# Patient Record
Sex: Male | Born: 1965 | Race: White | Hispanic: Yes | Marital: Single | Smoking: Former smoker
Health system: Southern US, Community
[De-identification: ages and names within clinical notes are randomized; demographics above are authoritative.]

## PROBLEM LIST (undated history)

## (undated) DIAGNOSIS — G4730 Sleep apnea, unspecified: Secondary | ICD-10-CM

## (undated) DIAGNOSIS — E119 Type 2 diabetes mellitus without complications: Secondary | ICD-10-CM

## (undated) DIAGNOSIS — R569 Unspecified convulsions: Secondary | ICD-10-CM

## (undated) DIAGNOSIS — K219 Gastro-esophageal reflux disease without esophagitis: Secondary | ICD-10-CM

## (undated) DIAGNOSIS — R0602 Shortness of breath: Secondary | ICD-10-CM

## (undated) DIAGNOSIS — K7460 Unspecified cirrhosis of liver: Secondary | ICD-10-CM

## (undated) DIAGNOSIS — F1010 Alcohol abuse, uncomplicated: Secondary | ICD-10-CM

## (undated) DIAGNOSIS — R51 Headache: Secondary | ICD-10-CM

## (undated) DIAGNOSIS — Z8719 Personal history of other diseases of the digestive system: Secondary | ICD-10-CM

## (undated) HISTORY — PX: TIPS PROCEDURE: SHX808

## (undated) HISTORY — PX: APPENDECTOMY: SHX54

## (undated) HISTORY — PX: ESOPHAGOGASTRODUODENOSCOPY: SHX1529

---

## 2004-01-03 ENCOUNTER — Emergency Department (HOSPITAL_COMMUNITY): Admission: EM | Admit: 2004-01-03 | Discharge: 2004-01-03 | Payer: Self-pay | Admitting: Emergency Medicine

## 2004-09-22 ENCOUNTER — Inpatient Hospital Stay (HOSPITAL_COMMUNITY): Admission: EM | Admit: 2004-09-22 | Discharge: 2004-09-23 | Payer: Self-pay | Admitting: Emergency Medicine

## 2004-12-24 ENCOUNTER — Emergency Department (HOSPITAL_COMMUNITY): Admission: EM | Admit: 2004-12-24 | Discharge: 2004-12-24 | Payer: Self-pay | Admitting: Emergency Medicine

## 2005-01-13 ENCOUNTER — Ambulatory Visit: Payer: Self-pay | Admitting: Internal Medicine

## 2005-11-11 ENCOUNTER — Ambulatory Visit: Payer: Self-pay | Admitting: Internal Medicine

## 2005-11-12 ENCOUNTER — Ambulatory Visit: Payer: Self-pay | Admitting: *Deleted

## 2006-02-21 ENCOUNTER — Inpatient Hospital Stay (HOSPITAL_COMMUNITY): Admission: EM | Admit: 2006-02-21 | Discharge: 2006-03-02 | Payer: Self-pay | Admitting: Emergency Medicine

## 2006-02-21 ENCOUNTER — Ambulatory Visit: Payer: Self-pay | Admitting: Family Medicine

## 2006-02-22 ENCOUNTER — Encounter (INDEPENDENT_AMBULATORY_CARE_PROVIDER_SITE_OTHER): Payer: Self-pay | Admitting: *Deleted

## 2006-03-02 ENCOUNTER — Ambulatory Visit: Payer: Self-pay | Admitting: Gastroenterology

## 2006-11-23 ENCOUNTER — Encounter (INDEPENDENT_AMBULATORY_CARE_PROVIDER_SITE_OTHER): Payer: Self-pay | Admitting: *Deleted

## 2009-02-18 ENCOUNTER — Emergency Department (HOSPITAL_COMMUNITY): Admission: EM | Admit: 2009-02-18 | Discharge: 2009-02-19 | Payer: Self-pay | Admitting: Emergency Medicine

## 2010-06-09 LAB — DIFFERENTIAL
Basophils Absolute: 0 10*3/uL (ref 0.0–0.1)
Basophils Relative: 1 % (ref 0–1)
Eosinophils Absolute: 0 10*3/uL (ref 0.0–0.7)
Eosinophils Relative: 0 % (ref 0–5)
Lymphocytes Relative: 16 % (ref 12–46)
Lymphs Abs: 0.8 10*3/uL (ref 0.7–4.0)
Monocytes Absolute: 0.4 10*3/uL (ref 0.1–1.0)
Monocytes Relative: 7 % (ref 3–12)
Neutro Abs: 3.7 10*3/uL (ref 1.7–7.7)
Neutrophils Relative %: 76 % (ref 43–77)

## 2010-06-09 LAB — COMPREHENSIVE METABOLIC PANEL
ALT: 65 U/L — ABNORMAL HIGH (ref 0–53)
AST: 70 U/L — ABNORMAL HIGH (ref 0–37)
Albumin: 4.2 g/dL (ref 3.5–5.2)
Alkaline Phosphatase: 61 U/L (ref 39–117)
BUN: 5 mg/dL — ABNORMAL LOW (ref 6–23)
CO2: 25 mEq/L (ref 19–32)
Calcium: 9 mg/dL (ref 8.4–10.5)
Chloride: 99 mEq/L (ref 96–112)
Creatinine, Ser: 0.61 mg/dL (ref 0.4–1.5)
GFR calc Af Amer: >60 mL/min (ref >60)
GFR calc non Af Amer: >60 mL/min (ref >60)
Glucose, Bld: 144 mg/dL — ABNORMAL HIGH (ref 70–99)
Potassium: 2.8 mEq/L — ABNORMAL LOW (ref 3.5–5.1)
Sodium: 135 mEq/L (ref 135–145)
Total Bilirubin: 1.5 mg/dL — ABNORMAL HIGH (ref 0.3–1.2)
Total Protein: 7.8 g/dL (ref 6.0–8.3)

## 2010-06-09 LAB — URINALYSIS, ROUTINE W REFLEX MICROSCOPIC
Glucose, UA: 100 mg/dL — AB
Hgb urine dipstick: NEGATIVE
Ketones, ur: 15 mg/dL — AB
Leukocytes, UA: NEGATIVE
Nitrite: NEGATIVE
Protein, ur: 100 mg/dL — AB
Specific Gravity, Urine: 1.028 (ref 1.005–1.030)
Urobilinogen, UA: 1 mg/dL (ref 0.0–1.0)
pH: 8.5 — ABNORMAL HIGH (ref 5.0–8.0)

## 2010-06-09 LAB — CBC
HCT: 44.8 % (ref 39.0–52.0)
Hemoglobin: 15.6 g/dL (ref 13.0–17.0)
MCHC: 34.9 g/dL (ref 30.0–36.0)
MCV: 93.2 fL (ref 78.0–100.0)
Platelets: 73 10*3/uL — ABNORMAL LOW (ref 150–400)
RBC: 4.81 MIL/uL (ref 4.22–5.81)
RDW: 13.7 % (ref 11.5–15.5)
WBC: 4.9 10*3/uL (ref 4.0–10.5)

## 2010-06-09 LAB — URINE MICROSCOPIC-ADD ON

## 2010-06-09 LAB — LIPASE, BLOOD: Lipase: 27 U/L (ref 11–59)

## 2010-07-24 NOTE — Discharge Summary (Signed)
NAME:  [name] [name], [name] [name] NO.:  [id number]   MEDICAL RECORD NO.:  [id number]          PATIENT TYPE:  INP   LOCATION:  4704                         FACILITY:  [facility]   PHYSICIAN:  Pearlean Brownie, M.D.DATE OF BIRTH:  Nov 29, 1965   DATE OF ADMISSION:  02/21/2006  DATE OF DISCHARGE:  03/02/2006                               DISCHARGE SUMMARY   PRIMARY CARE PHYSICIAN:  [facility].   DISCHARGE DIAGNOSES:  1. Upper gastrointestinal bleed.  EGD performed on February 22, 2006      showed severe ulcerative esophagitis likely reflux related.  2. Alcohol abuse.  3. Delirium tremens.  4. Thrush.  5. H.pylori positive.   PROCEDURES:  1. An EGD performed February 22, 2006, shows severe ulcerative      esophagitis, likely reflux related.  There was no esophageal or      gastric varices.  They did have mild portohypertensive gastropathy      changes.  There was a single small, clean based duodenal ulcer in      the bulb.  Cultures were sent and was found to be positive.  2. Included a chest x-ray, December 17, that showed no acute findings.  3. An abdominal ultrasound, December 18, which showed no ascites or      pronounced splenomegaly.  Showed normal hepatopetal flow in the      portal vein.  The liver is enlarged and echogenic consistent with a      hepatocellular disease.  It did not show shrinkage or __________      that would allow a diagnosis of cirrhosis at the time of the      ultrasound.   LABS:  Urine culture, December 17, was insignificant growth.  Patient  had a CLOtest performed on the 18th of December and was found to be  urease positive, detesting the presence of Helicobacter pylori.  Hemoglobin A1c, obtained in the hospital, was 6.3.  His admission  hemoglobin was 15.4, discharge hemoglobin was stable at 14.5.  He had a  CMP obtained, on admission, which showed an AST of 222, an ALT of 118,  alkaline phosphatase of 95, T.bili of 1.9 with  normal creatinine, mildly  elevated glucose.  Lipase, obtained on the 17th of December, was 25 and  normal.  The elevated total bilirubin was then fractionated, he had a  total bilirubin of 1.7 with a direct bilirubin of 0.7 and an indirect  bilirubin of 1.0, it was felt that this was likely related to an acute  hepatic insult.  By the 23rd of December, his total bilirubin was 2.4,  alkaline phosphatase was 93, AST is 143, ALT is 102, again thought  consistent with alcoholic hepatitis.   HISTORY AND PHYSICAL:  Please see a copy of the dictated on the chart,  but in short, the patient is  45 year old Hispanic male with a history  of drinking 1 liter of Tequila per day and with a history of withdrawals  DTs earlier this year who came in with bloody emesis.  He was admitted  for an upper GI bleed. GI was consulted.  Dr. Christella Hartigan performed an EGD on  the 18th of December, shortly after admission.   HOSPITAL COURSE:  1. Blood emesis.  An EGD was performed by Dr. Christella Hartigan, which showed      severe ulcerative esophagitis, likely reflux related.  The patient      was watched in the hospital twice daily PPI.  His hemoglobin was      stable and the bloody emesis resolved and had not occurred for the      4 days prior to discharge.  He was tolerating p.o. without      difficulty and was denying any melena, nausea, vomiting or      abdominal pain and again had a stable hemoglobin.  CLOtest were      obtained following the EGD, which showed to be H.pylori positive      and the patient was placed on triple therapy for that for a      duration of 2 weeks and then instructed to continue twice daily      Prilosec for his foreseeable future until he follows up at      [facility].  2. H.pylori positive.  Patient was started on Biaxin 500 mg p.o.      b.i.d., Protonix 40 mg p.o. b.i.d. and amoxicillin 500 mg p.o.      b.i.d., this is to continue up through March 09, 2006, which      completes  2 weeks of triple therapy and so instructed to continue      Prilosec OTC 20 mg p.o. b.i.d. until seen by [facility] and      cleared.  He also instructed not to use ibuprofen, aspirin or      alcohol.  3. Alcohol abuse.  Patient went through delirium tremens in the      hospital with tachycardia, hallucinations and hypertensive      episodes.  He was started on an Ativan taper and monitored on      telemetry.  On the days prior to discharge, the patient was stable      on 1 mg p.o. q.8 hours of Ativan with a heart rate in the 70s-100s      with normal blood pressures and no fevers and hallucinations.  He      had no tremor on the day of discharge.  I sat and discussed with      the patient his options, he indicated that he wanted to stop      alcohol consumption.  We will arrange to have social work discuss      with the patient providing the numbers and contact information for      alcohol treatment centers available to him.  We will also have him      complete a prolonged taper of Ativan as an outpatient, he is to      take 1 mg p.o. q.8 hours on the 26th of December and the 27th of      December, then 1 mg p.o. q.12 hours on the 28th of December and the      29th of December and then 1 mg p.o. daily on the 30th of December      and then quit.  4. Hepatitis.  Ultrasound shows diffuse hepatocellar disease.  Given      the fact that he drinks approximately 1 liter of Tequila a day, it      was felt that this was likely alcohol related hepatitis.  Advised  the patient to abstain from alcohol.  Would appreciate a followup      at [facility], they will complete a workup for hepatitis,      including hepatitis serologies, etc, if they feel is indicated.      Would also repeat a CMP at his followup to evaluate and make sure      that his LFTs have trended down after abstinence from alcohol.  He      did have an abdominal ultrasound in the hospital that showed no     evidence of  gallstone disease or as dictated above.   DISCHARGE MEDICATIONS:  1. Biaxin 500 mg p.o. b.i.d. until January 2.  2. Amoxicillin 500 mg p.o. b.i.d. until January 2.  3. Prilosec OTC 20 mg p.o. b.i.d. indefinitely until seen at      [facility].  4. Ativan 1 mg p.o. q.8 hours on the 26th of December and the 27th of      December, then 1 mg p.o. q.12 hours on the 28th of December and the      29th of December and then 1 mg p.o. daily on the 30th of December,      then stop.   DISCHARGE INSTRUCTIONS:  He is instructed to call [facility] and  arrange a followup appointment in 2 weeks.  He is also instructed to  abstain from alcohol, Tequila, aspirin and ibuprofen.      Broadus John T. Pamalee Leyden, MD    ______________________________  Pearlean Brownie, M.D.    WTP/MEDQ  D:  03/02/2006  T:  03/03/2006  Job:  [id number]   cc:   [facility]

## 2010-07-24 NOTE — H&P (Signed)
NAME:  [name] [name], [name] [name] NO.:  [id number]   MEDICAL RECORD NO.:  [id number]          PATIENT TYPE:  EMS   LOCATION:  MAJO                         FACILITY:  [facility]   PHYSICIAN:  Lonia Blood, M.D.      DATE OF BIRTH:  04/23/1965   DATE OF ADMISSION:  09/21/2004  DATE OF DISCHARGE:                                HISTORY & PHYSICAL   PRIMARY CARE PHYSICIAN:  The patient is unassigned.   PRESENTING COMPLAINT:  Nausea and vomiting.   HISTORY OF PRESENT ILLNESS:  The patient is a 45 year old Hispanic male with  no significant past medical history who apparently drinks at least 3-4  bottles of beer every day, also other types of alcohol. The patient came in  with a 1-day history of intractable nausea and vomiting. The patient has  vomited at least 10 times and is unable to keep anything down. Per patient,  he has not had any diarrhea. There was a question of some blood streaks in  one vomitus at home. He is apparently not had adequate alcohol intake for  the past 2 days. He drink one bottle yesterday but has been wanting to quit.  The patient wants some help at quitting alcohol at this stage.  Denied any  hematochezia or melena. However he has complained of epigastric pain.   PAST MEDICAL HISTORY:  None.   MEDICATIONS:  None.   ALLERGIES:  No known drug allergies.   SOCIAL HISTORY:  The patient is an Hispanic immigrant, lives here with his  wife. No children. Drinks about three bottles of beer every day plus other  hard liquor if available. Also smokes about 2-3 cigarettes per day. Denied  any IV drug use.  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   FAMILY HISTORY:  Denied any family history of hypertension or diabetes.   REVIEW OF SYSTEMS:  As obtained through an Hispanic interpreter, the patient  denied any other symptoms through a 10-point review of systems.   PHYSICAL EXAMINATION:  VITAL SIGNS:  Temperature is 97.6, blood pressure  166/77 supine, 155/87 sitting, and 142/86 standing with a pulse of 100, 106,  and 108 respectively. Respiratory 28. His saturation were 96% on room air.  GENERAL:  The patient is alert and oriented, non English speaking with  history obtained through his interpreter. He looks like he is in pain.  HEENT:  Pupils are equal, round and reactive to light. EOMI.  NECK:  Supple, no jugular venous distension, no lymphadenopathy.  RESPIRATORY:  Good air entry bilaterally, no wheezes or rales.  CARDIOVASCULAR:  Tachycardic.  ABDOMEN:  Soft, nontender with positive bowel sounds although he has some  epigastric discomfort.  EXTREMITIES:  No clubbing, cyanosis or edema.   LABORATORY DATA:  Sodium 142, potassium 3.6, chloride 106, BUN 3, glucose  158. Creatinine 0.6, white count 8100, hemoglobin 12.9, platelet count  72,000 with an MCV of 98.5, differential was normal. Total protein 7.4,  albumin 3.7, AST 164, ALT 63, alkaline phosphatase 74, total bilirubin 2.1  with an indirect bilirubin of 1.4. Alcohol was less  than 5. Urine drug  screen essentially negative.   ASSESSMENT:  1.  This is a 45 year old Hispanic with apparent history of alcoholism,      presenting with what appeared to be alcohol withdrawal syndrome.  2.  The patient also has severe thrombocytopenia.  3.  Elevation of liver function tests. His increase in liver function tests      and thrombocytopenia could all be related to alcoholism.   PLAN:  1.  Alcohol withdrawal. Will admit the patient, control his nausea and      vomiting which seems to be secondary to his alcohol withdrawal. Will use      a combination of both Phenergan and Zofran as indicated. I will keep him      on only clear liquids. While hydrating the patient it looks like he is      slightly orthostatic. Will use D5 normal for now. Once the patient's      situation stabilizes, will consider probably Behavior Health if he will      qualify. In the meantime will also  place him on a proton pump inhibitor.      There is questionable hematemesis, will Guaiac his stools while he is      here and if his stools are positive will get GI involved.  2.  Increased liver function tests. The patient has a 2/12 split which may      indicate this is secondary to alcohol, including his thrombocytopenia.      Hopefully quitting the alcohol will help.  3.  Alcoholism. Will put patient on some thiamine and folate, also put him      in the DT ward just in case he goes into DT's. I will given Ativan      p.r.n. for now and if he goes into full blown DT will start DT protocol.  4.  Thrombocytopenia. As indicated, this is most likely secondary to his      alcoholism. I will follow his blood levels closely during this      hospitalization.  5.  Tobacco use. Will continue to counsel patient while in the hospital.      Will put him some nicotine patches if needed.  6.  High blood pressure. The patient's blood pressure on admission seemed to      be high but patient denied any history of hypertension. Will follow him      closely while in the hospital.  If his blood pressure remains elevated we may have to initiate him on some  blood pressure medicine, probably Clonidine.       LG/MEDQ  D:  09/21/2004  T:  09/22/2004  Job:  [id number]

## 2010-07-24 NOTE — H&P (Signed)
NAME:  [name] [name], [name] [name] NO.:  [id number]   MEDICAL RECORD NO.:  [id number]          PATIENT TYPE:  INP   LOCATION:  4737                         FACILITY:  [facility]   PHYSICIAN:  Lupita Raider, M.D.   DATE OF BIRTH:  13-Jul-1965   DATE OF ADMISSION:  02/21/2006  DATE OF DISCHARGE:                              HISTORY & PHYSICAL   PRIMARY CARE PHYSICIAN:  None.   CHIEF COMPLAINT:  Bloody emesis.   HISTORY OF PRESENT ILLNESS:  This is a 45 year old Hispanic male with 1  liter of  Tequila per day alcohol abuse with a history of withdrawal DTs  after three days abstinence earlier this year, now with an one-day  history of abdominal pain in the epigastric area and vomiting blood-  streaked emesis since last night 10 times.  Vomiting started last night  after finishing his typical Tequila dosage.  He complains of dizziness  with standing and abdominal pain in the epigastrium, not relieved with  vomiting, but otherwise without complaints, including no chest pain,  shortness of breath, or diarrhea.  No history of varices or history of a  scope.  No loss of consciousness or syncope.  His last Tequila drink was  last evening.   REVIEW OF SYSTEMS:  Negative for cough, shortness of breath, chest pain,  dysuria, diarrhea.  Is positive, except for the HPI for mild headache,  diffuse fatigue and dysphagia since the vomiting began.   PAST MEDICAL HISTORY:  Negative except for a previous admission with  alcohol withdrawal.   PAST SURGICAL HISTORY:  No past surgical history.   ALLERGIES:  NO KNOWN DRUG ALLERGIES.   MEDICATIONS:  None.   SOCIAL HISTORY:  Lives at home with his girlfriend but does seem to be  married.  There are kids.  He works in [employer].  He drinks two 0.5  liter bottles of Tequila per day.  Stopped smoking three months ago.  Denies illicit drugs.   FAMILY HISTORY:  Mother currently living and healthy at 73, only with  diabetes.  Father was murdered.   No other significant family history.   PHYSICAL EXAMINATION:  VITAL SIGNS:  Temperature 98.8, respirations 20-  30, heart rate 103-118, blood pressure 144 to 148 over 84 to 88,  saturating 96-97% on room air.  GENERAL:  This is a fatigued, but alert and nontoxic-appearing, Hispanic  male who appears his stated age and speaks minimal [place].  HEENT:  Moist mucous membranes.  Normal oropharynx.  Muddy sclerae with  pupils equal, round and reactive to light.  Extraocular motor is intact.  No lymphadenopathy.  Poor dentition.  Conjunctivae are pink.  CARDIOVASCULAR:  Regular rate but tachycardic.  No murmurs, rubs or  gallops.  Brisk capillary refill.  No bruits.  PULMONARY:  Clear to auscultation bilaterally.  No wheezes, rales or  crackles.  ABDOMEN:  Obese, positive bowel sounds, soft, tender to palpation in the  epigastric area but no rebound, guarding or rigidity.  Could not  appreciate a fluid wave.  Liver edge not palpable below the costal  margin secondary to body habitus.  EXTREMITIES:  No edema.  SKIN:  Warm and dry.  Good skin turgor.  NEUROLOGIC:  Cranial nerves II-XII are intact.  Strength 5/5 in all  extremities.  Reflexes 2+ in all extremities.  Alert and oriented x3.  RECTAL:  Normal tone.  No internal or external hemorrhoids appreciated.  Hemoccult positive.   LABORATORY DATA:  White blood cell count 6.8, hemoglobin 15.4,  hematocrit 44.7, platelets 124, 83% neutrophils, sodium 137, potassium  3.2, chloride 96, bicarbonate 27, BUN 3, creatinine 0.6, glucose 174,  AST 222, ALT 118, calcium 9.7, total protein 7.9, albumin 4, total  bilirubin 1.9, lipase 25, UA 1.027 specific gravity with small  bilirubin, 15 ketones, greater than 300 protein, positive nitrites,  small leukocyte esterase, 3-6 white blood cells and rare bacteria.  Enzymes with a myoglobin of 45.7, CK-MB 1.3 and troponins 0.08.  EKG  showed normal sinus rhythm.   ASSESSMENT AND PLAN:  This is a 45 year old  Hispanic male with  significant alcohol history now with bloody emesis and elevated  troponin.  1. Bloody emesis.  Differential diagnosis is gastritis versus Mallory-      Weiss tear versus varices.  Hemodynamically unstable.  We will      obtain access and type and cross 2 units followed by 8 p.m. CBC.      Start IV fluids.  Protonix intravenously b.i.d.  Give him a GI      cocktail and follow clinically.  No need to call GI at this point      as he is stable, but would consider a GI consult at some point to      evaluate for a varices if he continues to have bleeding episodes.      Consider a beta blocker if blood pressure and heart rate can      tolerate.  2. Increased LFTs.  Likely this is secondary to alcohol but do wonder      about possible hepatitis.  We will check an acute hepatitis panel      and follow clinically.  3. Alcohol.  We will treat with Ativan protocol, thiamine and folate      and watch for symptoms of withdrawal.  Patient not ready to stop      drinking at this point, but we will continue to encourage him to do      so.  4. Elevated troponin.  Differential diagnosis can be from strain or      frank myocardial infarction.  We will admit and rule out for MI.      EKG showed normal sinus rhythm.  We will check a chest x-ray and      place him on the telemetry floor until rule out.  5. FEN.  GI and n.p.o. for now, but antibiotics x1 and then D5 half      normal saline with 20 of KCl at 125 mL/hour.  Place K for now      intravenously and follow his electrolytes.  6. Prophylaxis.  Sequential compression devices and proton pump      inhibitor.  7. Full code.           ______________________________  Lupita Raider, M.D.     KS/MEDQ  D:  02/21/2006  T:  02/22/2006  Job:  [id number]

## 2012-01-29 ENCOUNTER — Inpatient Hospital Stay (HOSPITAL_COMMUNITY): Payer: Self-pay

## 2012-01-29 ENCOUNTER — Inpatient Hospital Stay (HOSPITAL_COMMUNITY)
Admission: EM | Admit: 2012-01-29 | Discharge: 2012-02-01 | DRG: 405 | Disposition: A | Discharge status: Home / Self Care | Payer: MEDICAID | Source: Other Acute Inpatient Hospital | Attending: Internal Medicine | Admitting: Internal Medicine

## 2012-01-29 DIAGNOSIS — D6959 Other secondary thrombocytopenia: Secondary | ICD-10-CM | POA: Diagnosis present

## 2012-01-29 DIAGNOSIS — I8511 Secondary esophageal varices with bleeding: Secondary | ICD-10-CM | POA: Diagnosis present

## 2012-01-29 DIAGNOSIS — Z8719 Personal history of other diseases of the digestive system: Secondary | ICD-10-CM

## 2012-01-29 DIAGNOSIS — E876 Hypokalemia: Secondary | ICD-10-CM | POA: Diagnosis not present

## 2012-01-29 DIAGNOSIS — E669 Obesity, unspecified: Secondary | ICD-10-CM | POA: Diagnosis present

## 2012-01-29 DIAGNOSIS — F1010 Alcohol abuse, uncomplicated: Secondary | ICD-10-CM | POA: Diagnosis present

## 2012-01-29 DIAGNOSIS — Z6831 Body mass index (BMI) 31.0-31.9, adult: Secondary | ICD-10-CM

## 2012-01-29 DIAGNOSIS — D696 Thrombocytopenia, unspecified: Secondary | ICD-10-CM

## 2012-01-29 DIAGNOSIS — I8510 Secondary esophageal varices without bleeding: Secondary | ICD-10-CM | POA: Diagnosis present

## 2012-01-29 DIAGNOSIS — K7030 Alcoholic cirrhosis of liver without ascites: Principal | ICD-10-CM | POA: Diagnosis present

## 2012-01-29 DIAGNOSIS — F1020 Alcohol dependence, uncomplicated: Secondary | ICD-10-CM | POA: Diagnosis present

## 2012-01-29 DIAGNOSIS — E119 Type 2 diabetes mellitus without complications: Secondary | ICD-10-CM | POA: Diagnosis present

## 2012-01-29 HISTORY — DX: Unspecified convulsions: R56.9

## 2012-01-29 HISTORY — DX: Shortness of breath: R06.02

## 2012-01-29 HISTORY — DX: Personal history of other diseases of the digestive system: Z87.19

## 2012-01-29 HISTORY — DX: Headache: R51

## 2012-01-29 HISTORY — DX: Gastro-esophageal reflux disease without esophagitis: K21.9

## 2012-01-29 HISTORY — DX: Sleep apnea, unspecified: G47.30

## 2012-01-29 HISTORY — DX: Type 2 diabetes mellitus without complications: E11.9

## 2012-01-29 LAB — COMPREHENSIVE METABOLIC PANEL
AST: 27 U/L (ref 0–37)
Albumin: 2.4 g/dL — ABNORMAL LOW (ref 3.5–5.2)
BUN: 19 mg/dL (ref 6–23)
Calcium: 7.6 mg/dL — ABNORMAL LOW (ref 8.4–10.5)
Chloride: 114 mEq/L — ABNORMAL HIGH (ref 96–112)
Creatinine, Ser: 0.59 mg/dL (ref 0.50–1.35)
GFR calc non Af Amer: >90 mL/min (ref >90)
Total Bilirubin: 1 mg/dL (ref 0.3–1.2)

## 2012-01-29 LAB — CBC
Hemoglobin: 9.3 g/dL — ABNORMAL LOW (ref 13.0–17.0)
Platelets: 43 10*3/uL — ABNORMAL LOW (ref 150–400)
RBC: 2.87 MIL/uL — ABNORMAL LOW (ref 4.22–5.81)
WBC: 4.8 10*3/uL (ref 4.0–10.5)

## 2012-01-29 LAB — PROTIME-INR: Prothrombin Time: 17.1 seconds — ABNORMAL HIGH (ref 11.6–15.2)

## 2012-01-29 LAB — MRSA PCR SCREENING: MRSA by PCR: NEGATIVE

## 2012-01-29 LAB — APTT: aPTT: 35 seconds (ref 24–37)

## 2012-01-29 MED ORDER — INSULIN ASPART 100 UNIT/ML ~~LOC~~ SOLN
0.0000 [IU] | SUBCUTANEOUS | Status: DC
  Administered 2012-01-29: 1 [IU] via SUBCUTANEOUS
  Administered 2012-01-30 – 2012-01-31 (×2): 2 [IU] via SUBCUTANEOUS
  Administered 2012-01-31: 3 [IU] via SUBCUTANEOUS

## 2012-01-29 MED ORDER — SODIUM CHLORIDE 0.9 % IV SOLN
8.0000 mg/h | INTRAVENOUS | Status: DC
  Administered 2012-01-29: 8 mg/h via INTRAVENOUS
  Filled 2012-01-29 (×8): qty 80

## 2012-01-29 MED ORDER — IOHEXOL 300 MG/ML  SOLN
20.0000 mL | INTRAMUSCULAR | Status: AC
  Administered 2012-01-29 (×2): 20 mL via ORAL

## 2012-01-29 MED ORDER — ONDANSETRON HCL 4 MG/2ML IJ SOLN
4.0000 mg | Freq: Four times a day (QID) | INTRAMUSCULAR | Status: DC | PRN
  Administered 2012-01-29: 4 mg via INTRAVENOUS
  Filled 2012-01-29: qty 2

## 2012-01-29 MED ORDER — IOHEXOL 300 MG/ML  SOLN
100.0000 mL | Freq: Once | INTRAMUSCULAR | Status: AC | PRN
  Administered 2012-01-29: 100 mL via INTRAVENOUS

## 2012-01-29 MED ORDER — SODIUM CHLORIDE 0.9 % IV SOLN
INTRAVENOUS | Status: DC
  Administered 2012-01-31: 20 mL/h via INTRAVENOUS

## 2012-01-29 MED ORDER — SODIUM CHLORIDE 0.9 % IV SOLN
50.0000 ug/h | INTRAVENOUS | Status: DC
  Administered 2012-01-29: 50 ug/h via INTRAVENOUS
  Filled 2012-01-29 (×7): qty 1

## 2012-01-29 MED ORDER — PANTOPRAZOLE SODIUM 40 MG IV SOLR: 40.0000 mg | INTRAVENOUS | Status: DC

## 2012-01-29 MED ORDER — SODIUM CHLORIDE 0.9 % IJ SOLN
3.0000 mL | Freq: Two times a day (BID) | INTRAMUSCULAR | Status: DC
  Administered 2012-01-31 – 2012-02-01 (×3): 3 mL via INTRAVENOUS

## 2012-01-29 MED ORDER — ACETAMINOPHEN 325 MG PO TABS
650.0000 mg | ORAL_TABLET | Freq: Once | ORAL | Status: AC
  Administered 2012-01-29: 650 mg via ORAL
  Filled 2012-01-29: qty 2

## 2012-01-29 MED ORDER — ONDANSETRON HCL 4 MG PO TABS: 4.0000 mg | ORAL_TABLET | Freq: Four times a day (QID) | ORAL | Status: DC | PRN

## 2012-01-29 NOTE — H&P (Signed)
[facility] History and Physical  [name] [name] ZOX:[id number] DOB: Jun 11, 1965 DOA: 01/29/2012  Referring physician: Jetty Peeks, interventional radiology PCP: Patient has a primary care physician Specialists: Jetty Peeks, interventional radiology  Chief Complaint: Needs tips procedure  HPI: [name] [name] is a 46 y.o. male  Past oral history of heavy alcohol use (quit one year ago) plus secondary cirrhosis who was admitted to [facility] 2 days ago for a GI bleed. Patient underwent endoscopy and was found to have esophageal varices status post banding. Post procedure, he was placed on an octreotide drip. Dr.Schick, interventional radiology, was consulted there for need for a TIPS procedure, however equipment is not available. Dr.Schick, who also practices here, arranged for patient to be transferred to [facility] under the hospitalist service for plans for TIPS procedure here.  Review of Systems:   Past medical history: Alcohol abuse, secondary cirrhosis, secondary thrombocytopenia, mild obesity, diabetes mellitus-diet controlled  Social History:  Patient denies any tobacco use. He does no longer drink, but used to drink quite heavily. Last drink was one year ago. No drug use. He lives at home alone. His sister lives nearby. He is normally able to participate in almost all activities of daily living  No Known Allergies  Family history: discussed with patient. No family medical problems  Prior to Admission medications   Not on File   Physical Exam: Filed Vitals:   01/29/12 1500  Temp: 98.3 F (36.8 C)  TempSrc: Oral  Resp: 18     General:  Alert and oriented, in no acute distress, fatigued, looks older than stated age   Eyes: Sclera nonicteric, extraocular movements are intact   ENT: Normocephalic, atraumatic, mucous members are slightly dry   Neck: Supple no carotid bruits   Cardiovascular: Regular rate and rhythm, S1-S2   Respiratory: Clear to  auscultation bilaterally   Abdomen: Soft, distended, nontender, normoactive bowel sounds   Skin: No acute skin breaks, tears or lesions   Musculoskeletal: Clubbing or cyanosis or edema   Psychiatric: Patient is appropriate, no evidence of psychoses   Neurologic: No overt obvious focal deficits  Labs on Admission:  Have ordered coags, basic metabolic panel and CBC which are pending    Assessment/Plan Principal Problem:  *Esophageal varices in alcoholic cirrhosis: Seen by interventional radiology. Plan is for TIPS procedure tomorrow. Active Problems:  History of Alcohol abuse: Noted  Alcoholic cirrhosis: As above. No risk for DTs as he is no longer drinking.   Thrombocytopenia, secondary: Check a CBC. Possible platelet transfusion   DM type 2 (diabetes mellitus, type 2): On clear liquids. Every 4 hours sliding scale-sensitive   History of GI bleed: Currently stable. Continue to octreotide drip. IV PPI.     Code Status: Full code  Family Communication: Plan discussed with patient at bedside  Disposition Plan: Include and recovery time, likely several days in the hospital, and then home  Time spent: 30 minutes  Hollice Espy [facility] Pager [phone]  If 7PM-7AM, please contact night-coverage [email or web address] Password [facility] 01/29/2012, 3:17 PM

## 2012-01-29 NOTE — H&P (Signed)
[name] [name] is an 46 y.o. male.   Chief Complaint: UGI BLEEDING FROM GASTRIC VARICES HPI: 46YO MALE WITH ETOH CIRRHOSIS, H/O ESOPHAGEAL VARICES AND PRIOR BANDING.  PRESENTED TO [facility] WITH ACUTE UGI BLEED AND HEMATEMESIS.  EGD POSITIVE FOR LARGE GASTRIC VARICES.  STABILIZED ON PROTONIX AND OCTREOTIDE.  RECEIVED BLOOD TRANSFUSIONS AND NO FURTHER ACTIVE BLEEDING.  TRANSFERRED TO [facility] FOR TIPS PROCEDURE.  STABLE HGB 11.6, PLTS 66, INR 1.33, CR 0.54, TBILI1.2.  LIVER DOPPLER SHOWS PATENT HEPATIC AND PORTAL VEINS, ECHO AT [facility] REPORTED EF 65%.  CALCULATED MELD SCORE IS 10(MAKING HIM A GOOD CANDIDATE FOR TIPS. NO SIGNS OF ENCEPHALOPATHY    Social History: DENIES DRINKING FOR PAST YEAR  Allergies: No Known Allergies  No prescriptions prior to admission    Review of Systems  Constitutional: Negative for fever and chills.  Respiratory: Negative for cough, hemoptysis and shortness of breath.   Gastrointestinal: Negative for heartburn, vomiting and abdominal pain.  Genitourinary: Negative for flank pain.  Psychiatric/Behavioral: Negative for depression.    Blood pressure 123/60, temperature 98.3 F (36.8 C), temperature source Oral, resp. rate 18, SpO2 97.00%. Physical Exam  Constitutional: He is oriented to person, place, and time. He appears well-developed and well-nourished.  Eyes: No scleral icterus.  Cardiovascular: Normal rate, regular rhythm and normal heart sounds.  Exam reveals no friction rub.   No murmur heard. Respiratory: Effort normal and breath sounds normal. No respiratory distress.  GI: Soft. Bowel sounds are normal. He exhibits no distension. There is no tenderness.  Neurological: He is alert and oriented to person, place, and time.  Skin: Skin is warm and dry.  Psychiatric: He has a normal mood and affect.     Assessment/Plan ETOH CIRRHOSIS, ACUTE UGI BLEED WITH GASTRIC VARICES TRANSFERRED FOR TIPS.  HEMODYNAMICALLY STABLE. PLAN FOR TIPS IN AM UNDER GENERAL  ANESTHESIA.  NEEDS CXR AND ABDOMEN CT WITH CONTRAST PRIOR TO PROCEDURE.  ANESTHESIA NOTIFIED THAT HE HAS ARRIVED.  [name] [name] T. 01/29/2012, 3:34 PM

## 2012-01-30 ENCOUNTER — Encounter (HOSPITAL_COMMUNITY): Payer: Self-pay | Admitting: Anesthesiology

## 2012-01-30 ENCOUNTER — Inpatient Hospital Stay (HOSPITAL_COMMUNITY): Payer: Self-pay | Admitting: Anesthesiology

## 2012-01-30 ENCOUNTER — Encounter (HOSPITAL_COMMUNITY)
Admission: EM | Disposition: A | Discharge status: Home / Self Care | Payer: Self-pay | Source: Other Acute Inpatient Hospital | Attending: Internal Medicine

## 2012-01-30 ENCOUNTER — Inpatient Hospital Stay (HOSPITAL_COMMUNITY): Payer: Self-pay

## 2012-01-30 DIAGNOSIS — Z8719 Personal history of other diseases of the digestive system: Secondary | ICD-10-CM

## 2012-01-30 DIAGNOSIS — K7030 Alcoholic cirrhosis of liver without ascites: Principal | ICD-10-CM

## 2012-01-30 DIAGNOSIS — E119 Type 2 diabetes mellitus without complications: Secondary | ICD-10-CM

## 2012-01-30 DIAGNOSIS — I8510 Secondary esophageal varices without bleeding: Secondary | ICD-10-CM

## 2012-01-30 HISTORY — PX: RADIOLOGY WITH ANESTHESIA: SHX6223

## 2012-01-30 LAB — GLUCOSE, CAPILLARY
Glucose-Capillary: 135 mg/dL — ABNORMAL HIGH (ref 70–99)
Glucose-Capillary: 100 mg/dL — ABNORMAL HIGH (ref 70–99)
Glucose-Capillary: 119 mg/dL — ABNORMAL HIGH (ref 70–99)

## 2012-01-30 SURGERY — RADIOLOGY WITH ANESTHESIA
Anesthesia: General

## 2012-01-30 MED ORDER — FENTANYL CITRATE 0.05 MG/ML IJ SOLN
25.0000 ug | INTRAMUSCULAR | Status: DC | PRN
  Administered 2012-01-30 (×2): 50 ug via INTRAVENOUS

## 2012-01-30 MED ORDER — LACTULOSE 10 GM/15ML PO SOLN
20.0000 g | Freq: Two times a day (BID) | ORAL | Status: DC
  Administered 2012-01-30 – 2012-02-01 (×4): 20 g via ORAL
  Filled 2012-01-30 (×5): qty 30

## 2012-01-30 MED ORDER — IOHEXOL 300 MG/ML  SOLN
450.0000 mL | Freq: Once | INTRAMUSCULAR | Status: AC | PRN
Start: 2012-01-30 — End: 2012-01-30
  Administered 2012-01-30: 200 mL via INTRAVENOUS

## 2012-01-30 MED ORDER — ONDANSETRON HCL 4 MG/2ML IJ SOLN: 4.0000 mg | Freq: Once | INTRAMUSCULAR | Status: DC | PRN

## 2012-01-30 MED ORDER — SUCCINYLCHOLINE CHLORIDE 20 MG/ML IJ SOLN
INTRAMUSCULAR | Status: DC | PRN
  Administered 2012-01-30: 100 mg via INTRAVENOUS

## 2012-01-30 MED ORDER — PROPOFOL 10 MG/ML IV BOLUS
INTRAVENOUS | Status: DC | PRN
  Administered 2012-01-30: 100 mg via INTRAVENOUS
  Administered 2012-01-30: 50 mg via INTRAVENOUS

## 2012-01-30 MED ORDER — LACTATED RINGERS IV SOLN
INTRAVENOUS | Status: DC | PRN
  Administered 2012-01-30 (×3): via INTRAVENOUS

## 2012-01-30 MED ORDER — CEFAZOLIN SODIUM 1-5 GM-% IV SOLN
INTRAVENOUS | Status: AC
  Filled 2012-01-30: qty 100

## 2012-01-30 MED ORDER — PHENYLEPHRINE HCL 10 MG/ML IJ SOLN
10.0000 mg | INTRAVENOUS | Status: DC | PRN
  Administered 2012-01-30: 40 ug/min via INTRAVENOUS

## 2012-01-30 MED ORDER — VECURONIUM BROMIDE 10 MG IV SOLR
INTRAVENOUS | Status: DC | PRN
  Administered 2012-01-30: 10 mg via INTRAVENOUS

## 2012-01-30 MED ORDER — FENTANYL CITRATE 0.05 MG/ML IJ SOLN
INTRAMUSCULAR | Status: DC | PRN
  Administered 2012-01-30: 75 ug via INTRAVENOUS
  Administered 2012-01-30: 125 ug via INTRAVENOUS

## 2012-01-30 MED ORDER — MORPHINE SULFATE 2 MG/ML IJ SOLN
2.0000 mg | INTRAMUSCULAR | Status: DC | PRN
  Administered 2012-01-30 – 2012-01-31 (×4): 2 mg via INTRAVENOUS
  Filled 2012-01-30 (×5): qty 1

## 2012-01-30 MED ORDER — ETOMIDATE 2 MG/ML IV SOLN
INTRAVENOUS | Status: DC | PRN
  Administered 2012-01-30: 20 mg via INTRAVENOUS

## 2012-01-30 MED ORDER — CEFAZOLIN SODIUM-DEXTROSE 2-3 GM-% IV SOLR
INTRAVENOUS | Status: DC | PRN
  Administered 2012-01-30: 2 g via INTRAVENOUS

## 2012-01-30 MED ORDER — LIDOCAINE HCL (CARDIAC) 20 MG/ML IV SOLN
INTRAVENOUS | Status: DC | PRN
  Administered 2012-01-30: 50 mg via INTRAVENOUS

## 2012-01-30 MED ORDER — GELATIN ABSORBABLE 12-7 MM EX MISC
CUTANEOUS | Status: AC
  Filled 2012-01-30: qty 1

## 2012-01-30 NOTE — Anesthesia Procedure Notes (Signed)
Procedure Name: Intubation Date/Time: 01/30/2012 9:11 AM Performed by: [name] [name] A Pre-anesthesia Checklist: Patient identified, Patient being monitored, Timeout performed, Emergency Drugs available and Suction available Patient Re-evaluated:Patient Re-evaluated prior to inductionOxygen Delivery Method: Circle system utilized Preoxygenation: Pre-oxygenation with 100% oxygen Intubation Type: IV induction, Rapid sequence and Cricoid Pressure applied Laryngoscope Size: Mac and 3 Grade View: Grade I Tube type: Oral Tube size: 8.0 mm Number of attempts: 1 Airway Equipment and Method: Stylet Placement Confirmation: ETT inserted through vocal cords under direct vision,  breath sounds checked- equal and bilateral and positive ETCO2 Secured at: 22 cm Tube secured with: Tape Dental Injury: Teeth and Oropharynx as per pre-operative assessment

## 2012-01-30 NOTE — Anesthesia Preprocedure Evaluation (Addendum)
Anesthesia Evaluation  Patient identified by MRN, date of birth, ID band Patient awake    Reviewed: Allergy & Precautions, H&P , NPO status , Patient's Chart, lab work & pertinent test results, reviewed documented beta blocker date and time   Airway Mallampati: II TM Distance: >3 FB Neck ROM: full    Dental  (+) Dental Advisory Given   Pulmonary neg pulmonary ROS,  breath sounds clear to auscultation        Cardiovascular negative cardio ROS  Rhythm:regular     Neuro/Psych negative neurological ROS  negative psych ROS   GI/Hepatic negative GI ROS, (+) Cirrhosis -  Esophageal Varices  substance abuse  alcohol use,   Endo/Other  negative endocrine ROSdiabetes  Renal/GU negative Renal ROS  negative genitourinary   Musculoskeletal   Abdominal   Peds  Hematology negative hematology ROS (+)   Anesthesia Other Findings See surgeon's H&P   Reproductive/Obstetrics negative OB ROS                          Anesthesia Physical Anesthesia Plan  ASA: III  Anesthesia Plan: General   Post-op Pain Management:    Induction: Intravenous  Airway Management Planned: Oral ETT  Additional Equipment:   Intra-op Plan:   Post-operative Plan: Extubation in OR  Informed Consent: I have reviewed the patients History and Physical, chart, labs and discussed the procedure including the risks, benefits and alternatives for the proposed anesthesia with the patient or authorized representative who has indicated his/her understanding and acceptance.   Dental Advisory Given  Plan Discussed with: CRNA and Surgeon  Anesthesia Plan Comments:         Anesthesia Quick Evaluation

## 2012-01-30 NOTE — Progress Notes (Signed)
Patient ID: [name] [name], male   DOB: 1965-11-14, 46 y.o.   MRN: [id number] Pt. Stable in the PACU. Some appropriate abd. Discomfort. Dressings clean and dry  Spoke with family after the procedure  Also, called his GI doc in [place], Dr Elon Jester, to give him an update.  He will need GI outpt follow up with Dr. Vernell Barrier in 2 weeks.  His office number is [phone]  Hopefully, he recovers overnight, can be weaned from the octreotide, and be discharged from [facility] tomorrow.

## 2012-01-30 NOTE — Anesthesia Postprocedure Evaluation (Signed)
Anesthesia Post Note  Patient: [name] [name]  Procedure(s) Performed: Procedure(s) (LRB): RADIOLOGY WITH ANESTHESIA (N/A)  Anesthesia type: general  Patient location: PACU  Post pain: Pain level controlled  Post assessment: Patient's Cardiovascular Status Stable  Last Vitals:  Filed Vitals:   01/30/12 1330  BP:   Pulse: 89  Temp: 36.9 C  Resp: 17    Post vital signs: Reviewed and stable  Level of consciousness: sedated  Complications: No apparent anesthesia complications

## 2012-01-30 NOTE — Procedures (Signed)
Successful creation of a 10 x 80mm TIPS from the middle hepatic vein to the rt portal vein. Successful large gastric coronary varix embolization  Stable to pacu Full report in PACS

## 2012-01-30 NOTE — Progress Notes (Signed)
Pt returned to 3300 from PACU. Oriented to unit and room. Safety discussed and call bell with in reach. VSS. Right neck dressing marked with old shadowing and right chest dressing is cd&i. Family at bedside. Will continue to monitor.  Elijah Birk, RN

## 2012-01-30 NOTE — Progress Notes (Signed)
[facility] PROGRESS NOTE  [name] [name] [name] DOB: [birth year] DOA: 01/29/2012 PCP: No primary provider on file.   Past history of heavy alcohol use (quit one year ago) with cirrhosis who was admitted to [facility] 2 days ago for a GI bleed. Patient underwent endoscopy and was found to have esophageal varices status post banding. Post procedure, he was placed on an octreotide and protonix drip. Dr.Schick, interventional radiology, was consulted there for need for a TIPS procedure, however equipment is not available. Dr.Schick, who also practices here, arranged for patient to be transferred to [facility] under the hospitalist service for plans for TIPS procedure here.  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 He will need GI outpt follow up with Dr. Vernell Barrier in 2 weeks. His office number is [phone]- dr Miles Costain has spoken with him and given him an update.     Code Status: full Family Communication: wife Disposition Plan: monitor in sdu overnight DVT prophylaxis: scds    Consultants:  IR  Procedures:  TIPs and embolization of gastric varix  HPI/Subjective: Pt sleepy- c/o abd discomfort and nausea.   Objective: Filed Vitals:   01/30/12 1430 01/30/12 1500 01/30/12 1530 01/30/12 1537  BP: 136/70 136/69 141/72 141/72  Pulse: 88 89 88 91  Temp:    98.5 F (36.9 C)  TempSrc:    Oral  Resp: 16 17 16 19   SpO2: 96% 98%  95%    Intake/Output Summary (Last 24 hours) at 01/30/12 1608 Last data filed at 01/30/12 1546  Gross per 24 hour  Intake    4574 ml  Output   4751 ml  Net   -177 ml    Exam:   General:  Sleepy, no distres  Cardiovascular: RRR, no murmurs  Respiratory: CTA b/l   Abdomen:  Soft, mild tenderness in upper abdomen, BS+, non-distended  Ext: no c/c/e  Data Reviewed: Basic Metabolic Panel:  Lab 01/30/12 8657 01/29/12 1517  NA -- 143  K -- 3.6  CL -- 114*  CO2 -- 24  GLUCOSE 128* 141*  BUN -- 19  CREATININE -- 0.59  CALCIUM -- 7.6*  MG -- --  PHOS -- --   Liver Function Tests:  Lab 01/29/12 1517  AST 27  ALT 24  ALKPHOS 60  BILITOT 1.0  PROT 4.9*  ALBUMIN 2.4*   No results found for this basename: LIPASE:5,AMYLASE:5 in the last 168 hours No results found for this basename: AMMONIA:5 in the last 168 hours CBC:  Lab 01/29/12 1517  WBC 4.8  NEUTROABS --  HGB 9.3*  HCT 27.7*  MCV 96.5  PLT 43*   Cardiac Enzymes: No results found for this basename: CKTOTAL:5,CKMB:5,CKMBINDEX:5,TROPONINI:5 in the last 168 hours BNP (last 3 results) No results found for this basename: PROBNP:3 in the last 8760 hours CBG:  Lab 01/30/12 1304 01/30/12 0437 01/29/12 2335 01/29/12 2104 01/29/12 1742  GLUCAP 132* 100* 114* 164* 125*    Recent Results (from the past 240 hour(s))  MRSA PCR SCREENING     Status: Normal   Collection Time   01/29/12  3:15  PM      Component Value Range Status Comment   MRSA by PCR NEGATIVE  NEGATIVE Final      Studies: Dg Chest 2 View  01/29/2012  *RADIOLOGY REPORT*  Clinical Data: Cirrhosis.  Upper GI bleed.  CHEST - 2 VIEW  Comparison: 02/21/2006  Findings: Shallow inspiration.  Cardiac enlargement with some prominence of pulmonary vascularity, most likely due to vascular crowding from shallow inspiration.  No focal airspace consolidation.  No blunting of costophrenic angles.  No pneumothorax.  Degenerative changes in the thoracic spine.  IMPRESSION: Shallow inspiration and cardiac enlargement.  No focal consolidation.   Original Report Authenticated By: Burman Nieves,  M.D.    Ct Abdomen Pelvis W Contrast  01/29/2012  *RADIOLOGY REPORT*  Clinical Data: GI bleed 2 days ago.  Endoscopy showed esophageal varices.  Cirrhosis and portal hypertension.  CT ABDOMEN AND PELVIS WITH CONTRAST  Technique:  Multidetector CT imaging of the abdomen and pelvis was performed following the standard protocol during bolus administration of intravenous contrast.  Contrast: OMNIPAQUE IOHEXOL 300 MG/ML  SOLN  Comparison: None.  Findings: Atelectasis in the lung bases.  Small esophageal hiatal hernia.  Mild cardiac enlargement.  Multiple enlarged tortuous and enhancing venous structures in the para-esophageal and epigastric region consistent with varices.  The portal and splenic veins are patent.  Spleen is enlarged.  Liver is atrophic with nodular contour consistent with cirrhosis.  Surgical absence of the gallbladder.  Pancreas, adrenal glands, and kidneys are unremarkable.  Normal caliber abdominal aorta without aneurysm.  No retroperitoneal lymphadenopathy.  The stomach, small bowel, and colon are decompressed.  No evidence of bowel wall thickening. Small amount of free fluid in the abdomen likely ascites.  No free air in the abdomen.  Pelvis:  Prostate gland is not enlarged.  Bladder wall is not thickened.  Small amount of free fluid in the pelvis, likely ascites.  No significant pelvic lymphadenopathy.  Diverticula in the sigmoid colon.  No diverticulitis.  The appendix is normal. Small right inguinal hernia containing fat.  The degenerative changes in the lumbar spine.  IMPRESSION: Changes of hepatic cirrhosis with portal venous hypertension including splenomegaly and varices.  Minimal abdominal and pelvic ascites.   Original Report Authenticated By: Burman Nieves, M.D.    Ir Angiogram Selective Each Additional Vessel  01/30/2012  *RADIOLOGY REPORT*  Clinical Data: Alcoholic cirrhosis, upper GI bleeding, endoscopy positive for large gastric varices, initially the patient was  hemodynamically unstable requiring Protonix, octreotide infusion, and blood transfusions.  The patient has been stabilized and was transferred [place] [facility] for T I P S placement.  TIPS CREATION FROM THE MIDDLE HEPATIC VEIN TO THE RIGHT HEPATIC VEIN (VIATORR STENT INSERTION 10 MM X 8 CM X 2 CM)  LARGE GASTRIC CORONARY VARIX CATHETERIZATION, ANGIOGRAM AND EMBOLIZATION  Date:  01/30/2012 08:25:00  Radiologist:  Judie Petit. Ruel Favors, M.D.  Medications:  1 unit Platelets, 2 grams ancef administered within 1 hour of the procedure, followed by general anesthesia  Guidance:  Ultrasound fluoroscopic  Fluoroscopy time:  45.8 minutes  Sedation time:  General anesthesia  Contrast volume:  200 ml Omnipaque-300  Complications:  No immediate  PROCEDURE/FINDINGS:  Informed consent was obtained from the patient following explanation of the procedure, risks, benefits and alternatives. The patient understands, agrees and consents for the procedure. All questions were addressed.  A time out was performed.  Maximal barrier sterile technique utilized including caps, mask, sterile gowns, sterile gloves, large sterile drape, hand hygiene,  and betadine  Initially, the right upper quadrant was sterilely prepped and draped.  After induction of general anesthesia, ultrasound percutaneous transhepatic access was performed of the right portal branch.  Contrast injection confirms transhepatic portal access. Guide wire advanced easily into the main portal vein followed by the Accustick dilator set.  This remains in place to serve as a "fluoroscopic target" during TIPS creation.  This was secured externally.  TIPS creation:  Attention was directed to the right neck.  Under sterile conditions and general anesthesia, right internal jugular vein access was performed with ultrasound.  Images obtained for documentation.  Guide wire advanced followed by tract dilatation to insert 10-French TIPS sheath.  TIPS sheath was advanced into the  middle hepatic vein.  Position confirmed with middle hepatic venography.  The TIPS needle was advanced through the sheath and several passes were made to eventually puncture the right portal vein peripheral to the confluence from the middle hepatic vein approach.  Contrast injection confirms portal access.  A glide wire was advanced easily into the portal vein.  5-French catheter was advanced over the guide wire.  Glide wire was exchanged for an Amplatz guide wire.  Initial tract dilatation performed with a 6 mm Mustang balloon.  Balloon was exchanged for marker pigtail catheter.  Simultaneous portal and hepatic venography was performed in preparation for stent placement.  To create the TIPS, a 10 mm x 8 cm x 2 cm Viatorr stent was advanced through the delivery sheath and deployed from the right portal vein access into the hepatic venous confluence.  This was dilated with a 10 x 4 Conquest balloon to full profile.  Following insertion, a TIPS portogram was performed.  This demonstrated wide patency of the TIPS however there is persistent retrograde flow into a large gastric coronary varix with delayed imaging this eventually communicates to a splenorenal shunt.  Variceal embolization:  10 French sheath was advanced through the newly created TIPS into the portal vein.  6-French Imager II guide catheter was advanced over a glide wire into the large gastric coronary varix.  Contrast injection reconfirms position within the coronary varix.  Measurements obtained for the appropriate diameter.  Embolization performed with deployment of two 12 mm x 9 mm Amplatzer Plug II occlusion devices followed by a 15 mm x 20 cm interlock coil.  Follow-up variceal angiogram confirms complete occlusion of the large gastric coronary varix.  Final TIPS portogram performed with pressure measurements.  This demonstrates wide patency of the newly created TIPS with preferential flow through the shunt.  Large coronary varix is occluded.  Final  portosystemic gradient is 9 mmHg.  Portal vein pressure 28 and right atrial pressure 19  TIPS sheath was removed.  Hemostasis obtained with compression followed by sterile dressing.  The percutaneous transhepatic portal access was retracted into the hepatic tract.  This was occluded by Gelfoam embolization.  The patient was extubated and awakened.  He was in stable condition to the PACU.  IMPRESSION: Successful creation of a transjugular intrahepatic portosystemic shunt (T I P S) from the middle hepatic vein to the right portal vein with insertion of a 10 x 80 x 20 Viatorr stent  Successful large gastric coronary varix embolization   Original Report Authenticated By: Judie Petit. Miles Costain, M.D.    Ir Transcath/emboliz  01/30/2012  *RADIOLOGY REPORT*  Clinical Data: Alcoholic cirrhosis, upper GI bleeding, endoscopy positive for large gastric varices, initially the patient was hemodynamically unstable requiring Protonix, octreotide infusion, and blood transfusions.  The patient  has been stabilized and was [place] [facility] for T I P S placement.  TIPS CREATION FROM THE MIDDLE HEPATIC VEIN TO THE RIGHT HEPATIC VEIN (VIATORR STENT INSERTION 10 MM X 8 CM X 2 CM)  LARGE GASTRIC CORONARY VARIX CATHETERIZATION, ANGIOGRAM AND EMBOLIZATION  Date:  01/30/2012 08:25:00  Radiologist:  Judie Petit. Ruel Favors, M.D.  Medications:  1 unit Platelets, 2 grams ancef administered within 1 hour of the procedure, followed by general anesthesia  Guidance:  Ultrasound fluoroscopic  Fluoroscopy time:  45.8 minutes  Sedation time:  General anesthesia  Contrast volume:  200 ml Omnipaque-300  Complications:  No immediate  PROCEDURE/FINDINGS:  Informed consent was obtained from the patient following explanation of the procedure, risks, benefits and alternatives. The patient understands, agrees and consents for the procedure. All questions were addressed.  A time out was performed.  Maximal barrier sterile technique utilized including  caps, mask, sterile gowns, sterile gloves, large sterile drape, hand hygiene, and betadine  Initially, the right upper quadrant was sterilely prepped and draped.  After induction of general anesthesia, ultrasound percutaneous transhepatic access was performed of the right portal branch.  Contrast injection confirms transhepatic portal access. Guide wire advanced easily into the main portal vein followed by the Accustick dilator set.  This remains in place to serve as a "fluoroscopic target" during TIPS creation.  This was secured externally.  TIPS creation:  Attention was directed to the right neck.  Under sterile conditions and general anesthesia, right internal jugular vein access was performed with ultrasound.  Images obtained for documentation.  Guide wire advanced followed by tract dilatation to insert 10-French TIPS sheath.  TIPS sheath was advanced into the middle hepatic vein.  Position confirmed with middle hepatic venography.  The TIPS needle was advanced through the sheath and several passes were made to eventually puncture the right portal vein peripheral to the confluence from the middle hepatic vein approach.  Contrast injection confirms portal access.  A glide wire was advanced easily into the portal vein.  5-French catheter was advanced over the guide wire.  Glide wire was exchanged for an Amplatz guide wire.  Initial tract dilatation performed with a 6 mm Mustang balloon.  Balloon was exchanged for marker pigtail catheter.  Simultaneous portal and hepatic venography was performed in preparation for stent placement.  To create the TIPS, a 10 mm x 8 cm x 2 cm Viatorr stent was advanced through the delivery sheath and deployed from the right portal vein access into the hepatic venous confluence.  This was dilated with a 10 x 4 Conquest balloon to full profile.  Following insertion, a TIPS portogram was performed.  This demonstrated wide patency of the TIPS however there is persistent retrograde flow  into a large gastric coronary varix with delayed imaging this eventually communicates to a splenorenal shunt.  Variceal embolization:  10 French sheath was advanced through the newly created TIPS into the portal vein.  6-French Imager II guide catheter was advanced over a glide wire into the large gastric coronary varix.  Contrast injection reconfirms position within the coronary varix.  Measurements obtained for the appropriate diameter.  Embolization performed with deployment of two 12 mm x 9 mm Amplatzer Plug II occlusion devices followed by a 15 mm x 20 cm interlock coil.  Follow-up variceal angiogram confirms complete occlusion of the large gastric coronary varix.  Final TIPS portogram performed with pressure measurements.  This demonstrates wide patency of the newly created TIPS with preferential  flow through the shunt.  Large coronary varix is occluded.  Final portosystemic gradient is 9 mmHg.  Portal vein pressure 28 and right atrial pressure 19  TIPS sheath was removed.  Hemostasis obtained with compression followed by sterile dressing.  The percutaneous transhepatic portal access was retracted into the hepatic tract.  This was occluded by Gelfoam embolization.  The patient was extubated and awakened.  He was in stable condition to the PACU.  IMPRESSION: Successful creation of a transjugular intrahepatic portosystemic shunt (T I P S) from the middle hepatic vein to the right portal vein with insertion of a 10 x 80 x 20 Viatorr stent  Successful large gastric coronary varix embolization   Original Report Authenticated By: Judie Petit. Miles Costain, M.D.    Ir Tips  01/30/2012  *RADIOLOGY REPORT*  Clinical Data: Alcoholic cirrhosis, upper GI bleeding, endoscopy positive for large gastric varices, initially the patient was hemodynamically unstable requiring Protonix, octreotide infusion, and blood transfusions.  The patient has been stabilized and was [place] [facility] for T I P S placement.   TIPS CREATION FROM THE MIDDLE HEPATIC VEIN TO THE RIGHT HEPATIC VEIN (VIATORR STENT INSERTION 10 MM X 8 CM X 2 CM)  LARGE GASTRIC CORONARY VARIX CATHETERIZATION, ANGIOGRAM AND EMBOLIZATION  Date:  01/30/2012 08:25:00  Radiologist:  Judie Petit. Ruel Favors, M.D.  Medications:  1 unit Platelets, 2 grams ancef administered within 1 hour of the procedure, followed by general anesthesia  Guidance:  Ultrasound fluoroscopic  Fluoroscopy time:  45.8 minutes  Sedation time:  General anesthesia  Contrast volume:  200 ml Omnipaque-300  Complications:  No immediate  PROCEDURE/FINDINGS:  Informed consent was obtained from the patient following explanation of the procedure, risks, benefits and alternatives. The patient understands, agrees and consents for the procedure. All questions were addressed.  A time out was performed.  Maximal barrier sterile technique utilized including caps, mask, sterile gowns, sterile gloves, large sterile drape, hand hygiene, and betadine  Initially, the right upper quadrant was sterilely prepped and draped.  After induction of general anesthesia, ultrasound percutaneous transhepatic access was performed of the right portal branch.  Contrast injection confirms transhepatic portal access. Guide wire advanced easily into the main portal vein followed by the Accustick dilator set.  This remains in place to serve as a "fluoroscopic target" during TIPS creation.  This was secured externally.  TIPS creation:  Attention was directed to the right neck.  Under sterile conditions and general anesthesia, right internal jugular vein access was performed with ultrasound.  Images obtained for documentation.  Guide wire advanced followed by tract dilatation to insert 10-French TIPS sheath.  TIPS sheath was advanced into the middle hepatic vein.  Position confirmed with middle hepatic venography.  The TIPS needle was advanced through the sheath and several passes were made to eventually puncture the right portal vein  peripheral to the confluence from the middle hepatic vein approach.  Contrast injection confirms portal access.  A glide wire was advanced easily into the portal vein.  5-French catheter was advanced over the guide wire.  Glide wire was exchanged for an Amplatz guide wire.  Initial tract dilatation performed with a 6 mm Mustang balloon.  Balloon was exchanged for marker pigtail catheter.  Simultaneous portal and hepatic venography was performed in preparation for stent placement.  To create the TIPS, a 10 mm x 8 cm x 2 cm Viatorr stent was advanced through the delivery sheath and deployed from the right portal vein access into the  hepatic venous confluence.  This was dilated with a 10 x 4 Conquest balloon to full profile.  Following insertion, a TIPS portogram was performed.  This demonstrated wide patency of the TIPS however there is persistent retrograde flow into a large gastric coronary varix with delayed imaging this eventually communicates to a splenorenal shunt.  Variceal embolization:  10 French sheath was advanced through the newly created TIPS into the portal vein.  6-French Imager II guide catheter was advanced over a glide wire into the large gastric coronary varix.  Contrast injection reconfirms position within the coronary varix.  Measurements obtained for the appropriate diameter.  Embolization performed with deployment of two 12 mm x 9 mm Amplatzer Plug II occlusion devices followed by a 15 mm x 20 cm interlock coil.  Follow-up variceal angiogram confirms complete occlusion of the large gastric coronary varix.  Final TIPS portogram performed with pressure measurements.  This demonstrates wide patency of the newly created TIPS with preferential flow through the shunt.  Large coronary varix is occluded.  Final portosystemic gradient is 9 mmHg.  Portal vein pressure 28 and right atrial pressure 19  TIPS sheath was removed.  Hemostasis obtained with compression followed by sterile dressing.  The  percutaneous transhepatic portal access was retracted into the hepatic tract.  This was occluded by Gelfoam embolization.  The patient was extubated and awakened.  He was in stable condition to the PACU.  IMPRESSION: Successful creation of a transjugular intrahepatic portosystemic shunt (T I P S) from the middle hepatic vein to the right portal vein with insertion of a 10 x 80 x 20 Viatorr stent  Successful large gastric coronary varix embolization   Original Report Authenticated By: Judie Petit. Miles Costain, M.D.     Scheduled Meds:   . [COMPLETED] acetaminophen  650 mg Oral Once  . ceFAZolin      . gelatin adsorbable      . insulin aspart  0-9 Units Subcutaneous Q4H  . [COMPLETED] iohexol  20 mL Oral Q1 Hr x 2  . lactulose  20 g Oral BID  . sodium chloride  3 mL Intravenous Q12H   Continuous Infusions:   . sodium chloride 20 mL/hr at 01/29/12 1615  . [DISCONTINUED] octreotide (SANDOSTATIN) infusion 50 mcg/hr (01/30/12 1400)  . [DISCONTINUED] pantoprozole (PROTONIX) infusion 8 mg/hr (01/30/12 1400)    ________________________________________________________________________  Time spent: 30 min    [facility]  [facility] Pager [phone] If 8PM-8AM, please contact night-coverage at [email or web address], password [facility] 01/30/2012, 4:08 PM  LOS: 1 day

## 2012-01-30 NOTE — Transfer of Care (Signed)
Immediate Anesthesia Transfer of Care Note  Patient: [name] [name]  Procedure(s) Performed: Procedure(s) (LRB) with comments: RADIOLOGY WITH ANESTHESIA (N/A)  Patient Location: PACU  Anesthesia Type:General  Level of Consciousness: awake  Airway & Oxygen Therapy: Patient Spontanous Breathing and Patient connected to face mask oxygen  Post-op Assessment: Report given to PACU RN and Post -op Vital signs reviewed and stable  Post vital signs: Reviewed and stable  Complications: No apparent anesthesia complications

## 2012-01-31 ENCOUNTER — Encounter (HOSPITAL_COMMUNITY): Payer: Self-pay

## 2012-01-31 DIAGNOSIS — D6959 Other secondary thrombocytopenia: Secondary | ICD-10-CM

## 2012-01-31 LAB — BASIC METABOLIC PANEL
BUN: 6 mg/dL (ref 6–23)
CO2: 27 mEq/L (ref 19–32)
Calcium: 7.8 mg/dL — ABNORMAL LOW (ref 8.4–10.5)
Chloride: 101 mEq/L (ref 96–112)
Creatinine, Ser: 0.52 mg/dL (ref 0.50–1.35)
GFR calc Af Amer: >90 mL/min (ref >90)
GFR calc non Af Amer: >90 mL/min (ref >90)
Glucose, Bld: 193 mg/dL — ABNORMAL HIGH (ref 70–99)
Sodium: 136 mEq/L (ref 135–145)

## 2012-01-31 LAB — GLUCOSE, CAPILLARY
Glucose-Capillary: 108 mg/dL — ABNORMAL HIGH (ref 70–99)
Glucose-Capillary: 122 mg/dL — ABNORMAL HIGH (ref 70–99)
Glucose-Capillary: 156 mg/dL — ABNORMAL HIGH (ref 70–99)
Glucose-Capillary: 209 mg/dL — ABNORMAL HIGH (ref 70–99)
Glucose-Capillary: 90 mg/dL (ref 70–99)

## 2012-01-31 LAB — CBC
HCT: 28.3 % — ABNORMAL LOW (ref 39.0–52.0)
MCV: 91.9 fL (ref 78.0–100.0)
RBC: 3.08 MIL/uL — ABNORMAL LOW (ref 4.22–5.81)
WBC: 5.6 10*3/uL (ref 4.0–10.5)

## 2012-01-31 LAB — PREPARE PLATELET PHERESIS: Unit division: 0

## 2012-01-31 MED ORDER — OXYCODONE HCL 5 MG PO TABS
5.0000 mg | ORAL_TABLET | ORAL | Status: DC | PRN
  Administered 2012-01-31: 10 mg via ORAL
  Administered 2012-02-01: 5 mg via ORAL
  Administered 2012-02-01: 10 mg via ORAL
  Filled 2012-01-31 (×2): qty 2
  Filled 2012-01-31: qty 1

## 2012-01-31 MED ORDER — PANTOPRAZOLE SODIUM 40 MG PO TBEC
40.0000 mg | DELAYED_RELEASE_TABLET | Freq: Every day | ORAL | Status: DC
  Administered 2012-01-31 – 2012-02-01 (×2): 40 mg via ORAL
  Filled 2012-01-31 (×2): qty 1

## 2012-01-31 MED ORDER — INSULIN ASPART 100 UNIT/ML ~~LOC~~ SOLN
0.0000 [IU] | Freq: Three times a day (TID) | SUBCUTANEOUS | Status: DC
  Administered 2012-01-31: 1 [IU] via SUBCUTANEOUS
  Administered 2012-02-01: 2 [IU] via SUBCUTANEOUS

## 2012-01-31 MED ORDER — POTASSIUM CHLORIDE CRYS ER 20 MEQ PO TBCR
40.0000 meq | EXTENDED_RELEASE_TABLET | Freq: Two times a day (BID) | ORAL | Status: AC
  Administered 2012-01-31 – 2012-02-01 (×3): 40 meq via ORAL
  Filled 2012-01-31 (×3): qty 2

## 2012-01-31 MED ORDER — MORPHINE SULFATE 2 MG/ML IJ SOLN: 2.0000 mg | INTRAMUSCULAR | Status: DC | PRN

## 2012-01-31 MED FILL — Gelatin Absorbable Sponge 12-7 MM: CUTANEOUS | Qty: 1 | Status: AC

## 2012-01-31 NOTE — Progress Notes (Signed)
Pt. Had one black stool during the night.  Will continue to monitor.  Vivi Martens RN

## 2012-01-31 NOTE — Progress Notes (Signed)
[facility] PROGRESS NOTE  [name] [name] NFA:[id number] DOB: Jan 24, 1966 DOA: 01/29/2012 PCP: No primary provider on file.   46yo male w/ a past history of heavy alcohol use (quit one year ago) with cirrhosis who was admitted to [facility] for a GI bleed. Patient underwent endoscopy and was found to have esophageal varices status post banding. Post procedure, he was placed on an octreotide and protonix drip. Dr.Schick, interventional radiology, was consulted there for need for a TIPS procedure, however equipment was not available. Dr.Schick, who also practices at [facility], arranged for patient to be transferred to [facility] under the Hospitalist service for plans for TIPS procedure here.  Assessment/Plan:  Esophageal varices in alcoholic cirrhosis - History of GI bleed S/p embolization of gastric varix and TIPS - cleared for D/C per IR side of things - has a GI MD in [place]  Alcohol abuse Discontinued ~1 year ago  Thrombocytopenia, secondary to EtOH liver disease Stable  DM type 2 (diabetes mellitus, type 2) Diet controlled - follow w/ resumption of diet  Severe hypokalemia Likely due to poor intake + diarrhea from lactulose - is too low to allow d/c w/o confirmation of improvement - check Mg level - follow  Dispo Need to correct hypokalemia and assure is tolerating regular diet prior to d/c home therefore not ready medically for d/c today - possible d/c Tues AM  Code Status: FULL Disposition Plan: monitor in sdu overnight - will need GI outpt follow up with Dr. Vernell Barrier in 2 weeks ([place] GI MD) - his office number is [phone] - Dr Miles Costain has spoken with him and given him an update  DVT prophylaxis: scds  Consultants:  IR  Procedures:  11/24 - TIPs and embolization of gastric varix under gen anesthesia  HPI/Subjective: Patient is alert and interactive.  He is hungry.  He does complain of some epigastric and right upper quadrant pain.  He also complains of some back  pain.  He denies fevers chills shortness of breath or chest pain.  Objective: Filed Vitals:   01/31/12 0230 01/31/12 0350 01/31/12 0400 01/31/12 0700  BP:  119/62    Pulse: 78 81 73   Temp:  98.2 F (36.8 C)  98.2 F (36.8 C)  TempSrc:  Oral  Oral  Resp: 14 19 14    SpO2: 98% 97% 96%     Intake/Output Summary (Last 24 hours) at 01/31/12 1142 Last data filed at 01/31/12 1040  Gross per 24 hour  Intake   1396 ml  Output   3702 ml  Net  -2306 ml    Exam:  General: No acute respiratory distress Lungs: Clear to auscultation bilaterally without wheezes or crackles Cardiovascular: Regular rate and rhythm without murmur gallop or rub normal S1 and S2 Abdomen: Mildly tender diffusely but worse in the right upper quadrant, only mildly distended but soft, bowel sounds positive, no rebound, no ascites, no appreciable mass Extremities: Trace edema bilateral lower extremities without cyanosis or clubbing   Data Reviewed: Basic Metabolic Panel:  Lab 01/31/12 2841 01/30/12 1018 01/29/12 1517  NA 135 -- 143  K 2.9* -- 3.6  CL 101 -- 114*  CO2 27 -- 24  GLUCOSE 91 128* 141*  BUN 6 -- 19  CREATININE 0.52 -- 0.59  CALCIUM 7.8* -- 7.6*  MG -- -- --  PHOS -- -- --   Liver Function Tests:  Lab 01/29/12 1517  AST 27  ALT 24  ALKPHOS 60  BILITOT 1.0  PROT 4.9*  ALBUMIN 2.4*   CBC:  Lab 01/31/12 0445 01/29/12 1517  WBC 5.6 4.8  NEUTROABS -- --  HGB 10.0* 9.3*  HCT 28.3* 27.7*  MCV 91.9 96.5  PLT 54* 43*   CBG:  Lab 01/31/12 0348 01/30/12 2347 01/30/12 2132 01/30/12 1548 01/30/12 1304  GLUCAP 96 156* 198* 119* 132*    Recent Results (from the past 240 hour(s))  MRSA PCR SCREENING     Status: Normal   Collection Time   01/29/12  3:15 PM      Component Value Range Status Comment   MRSA by PCR NEGATIVE  NEGATIVE Final      Studies: Dg Chest 2 View  01/29/2012  *RADIOLOGY REPORT*  Clinical Data: Cirrhosis.  Upper GI bleed.  CHEST - 2 VIEW  Comparison: 02/21/2006   IMPRESSION: Shallow inspiration and cardiac enlargement.  No focal consolidation.   Original Report Authenticated By: Burman Nieves, M.D.    Ct Abdomen Pelvis W Contrast  01/29/2012  *RADIOLOGY REPORT*  Clinical Data: GI bleed 2 days ago.  Endoscopy showed esophageal varices.  Cirrhosis and portal hypertension.  CT ABDOMEN AND PELVIS WITH CONTRAST  Technique:  Multidetector CT imaging of the abdomen and pelvis was performed following the standard protocol during bolus administration of intravenous contrast.  Contrast: OMNIPAQUE IOHEXOL 300 MG/ML  SOLN  Comparison: None.  IMPRESSION: Changes of hepatic cirrhosis with portal venous hypertension including splenomegaly and varices.  Minimal abdominal and pelvic ascites.   Original Report Authenticated By: Burman Nieves, M.D.    Ir Angiogram Selective Each Additional Vessel  01/30/2012  *RADIOLOGY REPORT*  Clinical Data: Alcoholic cirrhosis, upper GI bleeding, endoscopy positive for large gastric varices, initially the patient was hemodynamically unstable requiring Protonix, octreotide infusion, and blood transfusions.  The patient has been stabilized and was transferred [place] [facility] for T I P S placement.   IMPRESSION: Successful creation of a transjugular intrahepatic portosystemic shunt (T I P S) from the middle hepatic vein to the right portal vein with insertion of a 10 x 80 x 20 Viatorr stent  Successful large gastric coronary varix embolization   Original Report Authenticated By: Judie Petit. Shick, M.D.     Scheduled Meds:    . [EXPIRED] ceFAZolin      . [EXPIRED] gelatin adsorbable      . insulin aspart  0-9 Units Subcutaneous Q4H  . lactulose  20 g Oral BID  . potassium chloride  40 mEq Oral BID  . sodium chloride  3 mL Intravenous Q12H   Continuous Infusions:    . sodium chloride 20 mL/hr (01/31/12 0822)  . [DISCONTINUED] octreotide (SANDOSTATIN) infusion 50 mcg/hr (01/30/12 1400)  . [DISCONTINUED] pantoprozole  (PROTONIX) infusion 8 mg/hr (01/30/12 1400)   ________________________________________________________________________  Time spent: 30 min  Lonia Blood, MD [facility] Office  [phone] Pager [phone]  On-Call/Text Page:      [email or web address]      password [facility]  01/31/2012, 11:42 AM  LOS: 2 days

## 2012-01-31 NOTE — Progress Notes (Signed)
Agree with PA note.  Given history of gastric varices which can be notoriously difficult to manage both endoscopically and by TIPS, this patient may be a candidate for balloon-occluded retrograde transvenous obliteration (BRTO) of his gastric varices if he has recurrent bleeding after TIPS.   Signed,  Sterling Big, MD Vascular & Interventional Radiologist [facility]

## 2012-01-31 NOTE — Progress Notes (Addendum)
1 Day Post-Op  Subjective: TIPS procedure performed 11/24 in IR with Dr Miles Costain Pt tolerated well Overnight pt had one black stool - none since Passing gas; urinating well Minimal abd pain - especially at site of rt abd No N/V  Objective: Vital signs in last 24 hours: Temp:  [97.8 F (36.6 C)-98.5 F (36.9 C)] 98.2 F (36.8 C) (11/25 0350) Pulse Rate:  [67-93] 73  (11/25 0400) Resp:  [13-21] 14  (11/25 0400) BP: (115-151)/(58-93) 119/62 mmHg (11/25 0350) SpO2:  [94 %-100 %] 96 % (11/25 0400) Arterial Line BP: (136-142)/(60-62) 142/60 mmHg (11/24 1345) Last BM Date: 01/30/12  Intake/Output from previous day: 11/24 0701 - 11/25 0700 In: 3009 [I.V.:2710; Blood:299] Out: 5300 [Urine:5300] Intake/Output this shift:    PE:  98.2; BP stable A/O; appropriate Abd: + BS; no distention Sl tender epigastric and rt abd procedure site Rt IJ site sl tender; no bleeding; no hematoma Clean and dry Wbc wnl H/H stable; plts 54 K: 2.9   Lab Results:   Basename 01/31/12 0445 01/29/12 1517  WBC 5.6 4.8  HGB 10.0* 9.3*  HCT 28.3* 27.7*  PLT 54* 43*   BMET  Basename 01/31/12 0445 01/30/12 1018 01/29/12 1517  NA 135 -- 143  K 2.9* -- 3.6  CL 101 -- 114*  CO2 27 -- 24  GLUCOSE 91 128* --  BUN 6 -- 19  CREATININE 0.52 -- 0.59  CALCIUM 7.8* -- 7.6*   PT/INR  Basename 01/29/12 1517  LABPROT 17.1*  INR 1.43   ABG No results found for this basename: PHART:2,PCO2:2,PO2:2,HCO3:2 in the last 72 hours  Studies/Results: Dg Chest 2 View  01/29/2012  *RADIOLOGY REPORT*  Clinical Data: Cirrhosis.  Upper GI bleed.  CHEST - 2 VIEW  Comparison: 02/21/2006  Findings: Shallow inspiration.  Cardiac enlargement with some prominence of pulmonary vascularity, most likely due to vascular crowding from shallow inspiration.  No focal airspace consolidation.  No blunting of costophrenic angles.  No pneumothorax.  Degenerative changes in the thoracic spine.  IMPRESSION: Shallow inspiration and  cardiac enlargement.  No focal consolidation.   Original Report Authenticated By: Burman Nieves, M.D.    Ct Abdomen Pelvis W Contrast  01/29/2012  *RADIOLOGY REPORT*  Clinical Data: GI bleed 2 days ago.  Endoscopy showed esophageal varices.  Cirrhosis and portal hypertension.  CT ABDOMEN AND PELVIS WITH CONTRAST  Technique:  Multidetector CT imaging of the abdomen and pelvis was performed following the standard protocol during bolus administration of intravenous contrast.  Contrast: OMNIPAQUE IOHEXOL 300 MG/ML  SOLN  Comparison: None.  Findings: Atelectasis in the lung bases.  Small esophageal hiatal hernia.  Mild cardiac enlargement.  Multiple enlarged tortuous and enhancing venous structures in the para-esophageal and epigastric region consistent with varices.  The portal and splenic veins are patent.  Spleen is enlarged.  Liver is atrophic with nodular contour consistent with cirrhosis.  Surgical absence of the gallbladder.  Pancreas, adrenal glands, and kidneys are unremarkable.  Normal caliber abdominal aorta without aneurysm.  No retroperitoneal lymphadenopathy.  The stomach, small bowel, and colon are decompressed.  No evidence of bowel wall thickening. Small amount of free fluid in the abdomen likely ascites.  No free air in the abdomen.  Pelvis:  Prostate gland is not enlarged.  Bladder wall is not thickened.  Small amount of free fluid in the pelvis, likely ascites.  No significant pelvic lymphadenopathy.  Diverticula in the sigmoid colon.  No diverticulitis.  The appendix is normal. Small right inguinal  hernia containing fat.  The degenerative changes in the lumbar spine.  IMPRESSION: Changes of hepatic cirrhosis with portal venous hypertension including splenomegaly and varices.  Minimal abdominal and pelvic ascites.   Original Report Authenticated By: Burman Nieves, M.D.    Ir Angiogram Selective Each Additional Vessel  01/30/2012  *RADIOLOGY REPORT*  Clinical Data: Alcoholic  cirrhosis, upper GI bleeding, endoscopy positive for large gastric varices, initially the patient was hemodynamically unstable requiring Protonix, octreotide infusion, and blood transfusions.  The patient has been stabilized and [place] [facility] for T I P S placement.  TIPS CREATION FROM THE MIDDLE HEPATIC VEIN TO THE RIGHT HEPATIC VEIN (VIATORR STENT INSERTION 10 MM X 8 CM X 2 CM)  LARGE GASTRIC CORONARY VARIX CATHETERIZATION, ANGIOGRAM AND EMBOLIZATION  Date:  01/30/2012 08:25:00  Radiologist:  Judie Petit. Ruel Favors, M.D.  Medications:  1 unit Platelets, 2 grams ancef administered within 1 hour of the procedure, followed by general anesthesia  Guidance:  Ultrasound fluoroscopic  Fluoroscopy time:  45.8 minutes  Sedation time:  General anesthesia  Contrast volume:  200 ml Omnipaque-300  Complications:  No immediate  PROCEDURE/FINDINGS:  Informed consent was obtained from the patient following explanation of the procedure, risks, benefits and alternatives. The patient understands, agrees and consents for the procedure. All questions were addressed.  A time out was performed.  Maximal barrier sterile technique utilized including caps, mask, sterile gowns, sterile gloves, large sterile drape, hand hygiene, and betadine  Initially, the right upper quadrant was sterilely prepped and draped.  After induction of general anesthesia, ultrasound percutaneous transhepatic access was performed of the right portal branch.  Contrast injection confirms transhepatic portal access. Guide wire advanced easily into the main portal vein followed by the Accustick dilator set.  This remains in place to serve as a "fluoroscopic target" during TIPS creation.  This was secured externally.  TIPS creation:  Attention was directed to the right neck.  Under sterile conditions and general anesthesia, right internal jugular vein access was performed with ultrasound.  Images obtained for documentation.  Guide wire  advanced followed by tract dilatation to insert 10-French TIPS sheath.  TIPS sheath was advanced into the middle hepatic vein.  Position confirmed with middle hepatic venography.  The TIPS needle was advanced through the sheath and several passes were made to eventually puncture the right portal vein peripheral to the confluence from the middle hepatic vein approach.  Contrast injection confirms portal access.  A glide wire was advanced easily into the portal vein.  5-French catheter was advanced over the guide wire.  Glide wire was exchanged for an Amplatz guide wire.  Initial tract dilatation performed with a 6 mm Mustang balloon.  Balloon was exchanged for marker pigtail catheter.  Simultaneous portal and hepatic venography was performed in preparation for stent placement.  To create the TIPS, a 10 mm x 8 cm x 2 cm Viatorr stent was advanced through the delivery sheath and deployed from the right portal vein access into the hepatic venous confluence.  This was dilated with a 10 x 4 Conquest balloon to full profile.  Following insertion, a TIPS portogram was performed.  This demonstrated wide patency of the TIPS however there is persistent retrograde flow into a large gastric coronary varix with delayed imaging this eventually communicates to a splenorenal shunt.  Variceal embolization:  10 French sheath was advanced through the newly created TIPS into the portal vein.  6-French Imager II guide catheter was advanced over a  glide wire into the large gastric coronary varix.  Contrast injection reconfirms position within the coronary varix.  Measurements obtained for the appropriate diameter.  Embolization performed with deployment of two 12 mm x 9 mm Amplatzer Plug II occlusion devices followed by a 15 mm x 20 cm interlock coil.  Follow-up variceal angiogram confirms complete occlusion of the large gastric coronary varix.  Final TIPS portogram performed with pressure measurements.  This demonstrates wide patency of  the newly created TIPS with preferential flow through the shunt.  Large coronary varix is occluded.  Final portosystemic gradient is 9 mmHg.  Portal vein pressure 28 and right atrial pressure 19  TIPS sheath was removed.  Hemostasis obtained with compression followed by sterile dressing.  The percutaneous transhepatic portal access was retracted into the hepatic tract.  This was occluded by Gelfoam embolization.  The patient was extubated and awakened.  He was in stable condition to the PACU.  IMPRESSION: Successful creation of a transjugular intrahepatic portosystemic shunt (T I P S) from the middle hepatic vein to the right portal vein with insertion of a 10 x 80 x 20 Viatorr stent  Successful large gastric coronary varix embolization   Original Report Authenticated By: Judie Petit. Miles Costain, M.D.    Ir Transcath/emboliz  01/30/2012  *RADIOLOGY REPORT*  Clinical Data: Alcoholic cirrhosis, upper GI bleeding, endoscopy positive for large gastric varices, initially the patient was hemodynamically unstable requiring Protonix, octreotide infusion, and blood transfusions.  The patient has been stabilized and was [place] [facility] for T I P S placement.  TIPS CREATION FROM THE MIDDLE HEPATIC VEIN TO THE RIGHT HEPATIC VEIN (VIATORR STENT INSERTION 10 MM X 8 CM X 2 CM)  LARGE GASTRIC CORONARY VARIX CATHETERIZATION, ANGIOGRAM AND EMBOLIZATION  Date:  01/30/2012 08:25:00  Radiologist:  Judie Petit. Ruel Favors, M.D.  Medications:  1 unit Platelets, 2 grams ancef administered within 1 hour of the procedure, followed by general anesthesia  Guidance:  Ultrasound fluoroscopic  Fluoroscopy time:  45.8 minutes  Sedation time:  General anesthesia  Contrast volume:  200 ml Omnipaque-300  Complications:  No immediate  PROCEDURE/FINDINGS:  Informed consent was obtained from the patient following explanation of the procedure, risks, benefits and alternatives. The patient understands, agrees and consents for the procedure. All  questions were addressed.  A time out was performed.  Maximal barrier sterile technique utilized including caps, mask, sterile gowns, sterile gloves, large sterile drape, hand hygiene, and betadine  Initially, the right upper quadrant was sterilely prepped and draped.  After induction of general anesthesia, ultrasound percutaneous transhepatic access was performed of the right portal branch.  Contrast injection confirms transhepatic portal access. Guide wire advanced easily into the main portal vein followed by the Accustick dilator set.  This remains in place to serve as a "fluoroscopic target" during TIPS creation.  This was secured externally.  TIPS creation:  Attention was directed to the right neck.  Under sterile conditions and general anesthesia, right internal jugular vein access was performed with ultrasound.  Images obtained for documentation.  Guide wire advanced followed by tract dilatation to insert 10-French TIPS sheath.  TIPS sheath was advanced into the middle hepatic vein.  Position confirmed with middle hepatic venography.  The TIPS needle was advanced through the sheath and several passes were made to eventually puncture the right portal vein peripheral to the confluence from the middle hepatic vein approach.  Contrast injection confirms portal access.  A glide wire was advanced easily into the portal  vein.  5-French catheter was advanced over the guide wire.  Glide wire was exchanged for an Amplatz guide wire.  Initial tract dilatation performed with a 6 mm Mustang balloon.  Balloon was exchanged for marker pigtail catheter.  Simultaneous portal and hepatic venography was performed in preparation for stent placement.  To create the TIPS, a 10 mm x 8 cm x 2 cm Viatorr stent was advanced through the delivery sheath and deployed from the right portal vein access into the hepatic venous confluence.  This was dilated with a 10 x 4 Conquest balloon to full profile.  Following insertion, a TIPS  portogram was performed.  This demonstrated wide patency of the TIPS however there is persistent retrograde flow into a large gastric coronary varix with delayed imaging this eventually communicates to a splenorenal shunt.  Variceal embolization:  10 French sheath was advanced through the newly created TIPS into the portal vein.  6-French Imager II guide catheter was advanced over a glide wire into the large gastric coronary varix.  Contrast injection reconfirms position within the coronary varix.  Measurements obtained for the appropriate diameter.  Embolization performed with deployment of two 12 mm x 9 mm Amplatzer Plug II occlusion devices followed by a 15 mm x 20 cm interlock coil.  Follow-up variceal angiogram confirms complete occlusion of the large gastric coronary varix.  Final TIPS portogram performed with pressure measurements.  This demonstrates wide patency of the newly created TIPS with preferential flow through the shunt.  Large coronary varix is occluded.  Final portosystemic gradient is 9 mmHg.  Portal vein pressure 28 and right atrial pressure 19  TIPS sheath was removed.  Hemostasis obtained with compression followed by sterile dressing.  The percutaneous transhepatic portal access was retracted into the hepatic tract.  This was occluded by Gelfoam embolization.  The patient was extubated and awakened.  He was in stable condition to the PACU.  IMPRESSION: Successful creation of a transjugular intrahepatic portosystemic shunt (T I P S) from the middle hepatic vein to the right portal vein with insertion of a 10 x 80 x 20 Viatorr stent  Successful large gastric coronary varix embolization   Original Report Authenticated By: Judie Petit. Miles Costain, M.D.    Ir Tips  01/30/2012  *RADIOLOGY REPORT*  Clinical Data: Alcoholic cirrhosis, upper GI bleeding, endoscopy positive for large gastric varices, initially the patient was hemodynamically unstable requiring Protonix, octreotide infusion, and blood transfusions.   The patient has been stabilized and [place] [facility] for T I P S placement.  TIPS CREATION FROM THE MIDDLE HEPATIC VEIN TO THE RIGHT HEPATIC VEIN (VIATORR STENT INSERTION 10 MM X 8 CM X 2 CM)  LARGE GASTRIC CORONARY VARIX CATHETERIZATION, ANGIOGRAM AND EMBOLIZATION  Date:  01/30/2012 08:25:00  Radiologist:  Judie Petit. Ruel Favors, M.D.  Medications:  1 unit Platelets, 2 grams ancef administered within 1 hour of the procedure, followed by general anesthesia  Guidance:  Ultrasound fluoroscopic  Fluoroscopy time:  45.8 minutes  Sedation time:  General anesthesia  Contrast volume:  200 ml Omnipaque-300  Complications:  No immediate  PROCEDURE/FINDINGS:  Informed consent was obtained from the patient following explanation of the procedure, risks, benefits and alternatives. The patient understands, agrees and consents for the procedure. All questions were addressed.  A time out was performed.  Maximal barrier sterile technique utilized including caps, mask, sterile gowns, sterile gloves, large sterile drape, hand hygiene, and betadine  Initially, the right upper quadrant was sterilely prepped and draped.  After induction of general anesthesia, ultrasound percutaneous transhepatic access was performed of the right portal branch.  Contrast injection confirms transhepatic portal access. Guide wire advanced easily into the main portal vein followed by the Accustick dilator set.  This remains in place to serve as a "fluoroscopic target" during TIPS creation.  This was secured externally.  TIPS creation:  Attention was directed to the right neck.  Under sterile conditions and general anesthesia, right internal jugular vein access was performed with ultrasound.  Images obtained for documentation.  Guide wire advanced followed by tract dilatation to insert 10-French TIPS sheath.  TIPS sheath was advanced into the middle hepatic vein.  Position confirmed with middle hepatic venography.  The TIPS needle  was advanced through the sheath and several passes were made to eventually puncture the right portal vein peripheral to the confluence from the middle hepatic vein approach.  Contrast injection confirms portal access.  A glide wire was advanced easily into the portal vein.  5-French catheter was advanced over the guide wire.  Glide wire was exchanged for an Amplatz guide wire.  Initial tract dilatation performed with a 6 mm Mustang balloon.  Balloon was exchanged for marker pigtail catheter.  Simultaneous portal and hepatic venography was performed in preparation for stent placement.  To create the TIPS, a 10 mm x 8 cm x 2 cm Viatorr stent was advanced through the delivery sheath and deployed from the right portal vein access into the hepatic venous confluence.  This was dilated with a 10 x 4 Conquest balloon to full profile.  Following insertion, a TIPS portogram was performed.  This demonstrated wide patency of the TIPS however there is persistent retrograde flow into a large gastric coronary varix with delayed imaging this eventually communicates to a splenorenal shunt.  Variceal embolization:  10 French sheath was advanced through the newly created TIPS into the portal vein.  6-French Imager II guide catheter was advanced over a glide wire into the large gastric coronary varix.  Contrast injection reconfirms position within the coronary varix.  Measurements obtained for the appropriate diameter.  Embolization performed with deployment of two 12 mm x 9 mm Amplatzer Plug II occlusion devices followed by a 15 mm x 20 cm interlock coil.  Follow-up variceal angiogram confirms complete occlusion of the large gastric coronary varix.  Final TIPS portogram performed with pressure measurements.  This demonstrates wide patency of the newly created TIPS with preferential flow through the shunt.  Large coronary varix is occluded.  Final portosystemic gradient is 9 mmHg.  Portal vein pressure 28 and right atrial pressure 19   TIPS sheath was removed.  Hemostasis obtained with compression followed by sterile dressing.  The percutaneous transhepatic portal access was retracted into the hepatic tract.  This was occluded by Gelfoam embolization.  The patient was extubated and awakened.  He was in stable condition to the PACU.  IMPRESSION: Successful creation of a transjugular intrahepatic portosystemic shunt (T I P S) from the middle hepatic vein to the right portal vein with insertion of a 10 x 80 x 20 Viatorr stent  Successful large gastric coronary varix embolization   Original Report Authenticated By: Judie Petit. Miles Costain, M.D.     Anti-infectives: Anti-infectives     Start     Dose/Rate Route Frequency Ordered Stop   01/30/12 0921   ceFAZolin (ANCEF) 1-5 GM-% IVPB     Comments: DICK, ERNEST: cabinet override         01/30/12 0921 01/30/12 2129  Assessment/Plan: s/p Procedure(s) (LRB) with comments: RADIOLOGY WITH ANESTHESIA (N/A)  Transjugular Intrahepatic Portal System Shunt procedure 11/24 in IR Pt doing well Increase diet  prob dc today from our standpoint Will discuss with Radiologist Need 30 ml Lactulose BID(Rx given for 1 mo - to follow with GI MD)   LOS: 2 days    Melville Engen A 01/31/2012

## 2012-02-01 ENCOUNTER — Encounter (HOSPITAL_COMMUNITY): Payer: Self-pay | Admitting: Interventional Radiology

## 2012-02-01 LAB — BASIC METABOLIC PANEL
BUN: 5 mg/dL — ABNORMAL LOW (ref 6–23)
GFR calc non Af Amer: >90 mL/min (ref >90)
Glucose, Bld: 106 mg/dL — ABNORMAL HIGH (ref 70–99)
Potassium: 3.5 mEq/L (ref 3.5–5.1)

## 2012-02-01 LAB — GLUCOSE, CAPILLARY

## 2012-02-01 LAB — CBC
Hemoglobin: 9.3 g/dL — ABNORMAL LOW (ref 13.0–17.0)
MCH: 33 pg (ref 26.0–34.0)
MCHC: 35.5 g/dL (ref 30.0–36.0)

## 2012-02-01 NOTE — Discharge Summary (Signed)
Physician Discharge Summary  [name] [name] RUE:[id number] DOB: [birth year] DOA: 01/29/2012  PCP: No primary provider on file.  Admit date: 01/29/2012 Discharge date: 02/01/2012  Time spent: 35 minutes  Recommendations for Outpatient Follow-up:  1. F/u with Dr Vernell Barrier in 2 wks.   Discharge Diagnoses:  Principal Problem:  *Esophageal varices in alcoholic cirrhosis- with bleeding  Active Problems:  Alcohol abuse  Alcoholic cirrhosis  Thrombocytopenia, secondary  DM type 2 (diabetes mellitus, type 2)- diet controlled   Discharge Condition: stable  Diet recommendation: heart healthy and diabetic  Filed Weights   01/31/12 1555  Weight: 99.791 kg (220 lb)    History of present illness:  Past oral history of heavy alcohol use (quit one year ago) plus secondary cirrhosis who was admitted to [facility] 5 days ago for a GI bleed. Patient underwent endoscopy and was found to have esophageal varices status post banding. Post procedure, he was placed on an octreotide drip. Dr.Schick, interventional radiology, was consulted there for need for a TIPS procedure, however equipment was not available. Dr.Schick, who also practices here at [facility], arranged for patient to be transferred to [facility] under the hospitalist service for plans for TIPS procedure here.   Hospital Course:  Pt underwent a TIPS and embolization of right gastric coronary varix- IR has cleared him for d/c- pt will need to f/u with der Drealos (his GI physician) in 2 wks- Dr Miles Costain did update him over the phone.   Hypokalemia Resulted after TIPS -pt was given Lactulose by Dr Miles Costain as he stated that constipation is common after the procedure- he subsequently had diarrhea- K+ has been replaced- Lactulose is being discontinued now.   Consultations:  IR  Discharge Exam: Filed Vitals:   01/31/12 2300 02/01/12 0300 02/01/12 0738 02/01/12 1115  BP: 122/50 119/60 123/64   Pulse: 91 97 85   Temp: 99.5 F (37.5  C) 98.8 F (37.1 C) 98.6 F (37 C) 98.7 F (37.1 C)  TempSrc: Oral Oral Oral Oral  Resp: 16 15 18    Height:      Weight:      SpO2: 100% 97% 99%     General: alert, oriented x 3, no distress Cardiovascular: RRR, no murmurs Respiratory:  CTA b/l   Discharge Instructions  Discharge Orders    Future Orders Please Complete By Expires   Diet - low sodium heart healthy      Increase activity slowly      Discharge instructions      Comments:   F/u with Dr Villa Herb in 2 wks.       Medication List    Notice       You have not been prescribed any medications.           The results of significant diagnostics from this hospitalization (including imaging, microbiology, ancillary and laboratory) are listed below for reference.    Significant Diagnostic Studies: Dg Chest 2 View  01/29/2012  *RADIOLOGY REPORT*  Clinical Data: Cirrhosis.  Upper GI bleed.  CHEST - 2 VIEW  Comparison: 02/21/2006  Findings: Shallow inspiration.  Cardiac enlargement with some prominence of pulmonary vascularity, most likely due to vascular crowding from shallow inspiration.  No focal airspace consolidation.  No blunting of costophrenic angles.  No pneumothorax.  Degenerative changes in the thoracic spine.  IMPRESSION: Shallow inspiration and cardiac enlargement.  No focal consolidation.   Original Report Authenticated By: Burman Nieves, M.D.    Ct Abdomen Pelvis W Contrast  01/29/2012  *  RADIOLOGY REPORT*  Clinical Data: GI bleed 2 days ago.  Endoscopy showed esophageal varices.  Cirrhosis and portal hypertension.  CT ABDOMEN AND PELVIS WITH CONTRAST  Technique:  Multidetector CT imaging of the abdomen and pelvis was performed following the standard protocol during bolus administration of intravenous contrast.  Contrast: OMNIPAQUE IOHEXOL 300 MG/ML  SOLN  Comparison: None.  Findings: Atelectasis in the lung bases.  Small esophageal hiatal hernia.  Mild cardiac enlargement.  Multiple enlarged tortuous  and enhancing venous structures in the para-esophageal and epigastric region consistent with varices.  The portal and splenic veins are patent.  Spleen is enlarged.  Liver is atrophic with nodular contour consistent with cirrhosis.  Surgical absence of the gallbladder.  Pancreas, adrenal glands, and kidneys are unremarkable.  Normal caliber abdominal aorta without aneurysm.  No retroperitoneal lymphadenopathy.  The stomach, small bowel, and colon are decompressed.  No evidence of bowel wall thickening. Small amount of free fluid in the abdomen likely ascites.  No free air in the abdomen.  Pelvis:  Prostate gland is not enlarged.  Bladder wall is not thickened.  Small amount of free fluid in the pelvis, likely ascites.  No significant pelvic lymphadenopathy.  Diverticula in the sigmoid colon.  No diverticulitis.  The appendix is normal. Small right inguinal hernia containing fat.  The degenerative changes in the lumbar spine.  IMPRESSION: Changes of hepatic cirrhosis with portal venous hypertension including splenomegaly and varices.  Minimal abdominal and pelvic ascites.   Original Report Authenticated By: Burman Nieves, M.D.    Ir Angiogram Selective Each Additional Vessel  01/30/2012  *RADIOLOGY REPORT*  Clinical Data: Alcoholic cirrhosis, upper GI bleeding, endoscopy positive for large gastric varices, initially the patient was hemodynamically unstable requiring Protonix, octreotide infusion, and blood transfusions.  The patient has been stabilized and was transferred from [facility] for T I P S placement.  TIPS CREATION FROM THE MIDDLE HEPATIC VEIN TO THE RIGHT HEPATIC VEIN (VIATORR STENT INSERTION 10 MM X 8 CM X 2 CM)  LARGE GASTRIC CORONARY VARIX CATHETERIZATION, ANGIOGRAM AND EMBOLIZATION  Date:  01/30/2012 08:25:00  Radiologist:  Judie Petit. Ruel Favors, M.D.  Medications:  1 unit Platelets, 2 grams ancef administered within 1 hour of the procedure, followed by general anesthesia  Guidance:   Ultrasound fluoroscopic  Fluoroscopy time:  45.8 minutes  Sedation time:  General anesthesia  Contrast volume:  200 ml Omnipaque-300  Complications:  No immediate  PROCEDURE/FINDINGS:  Informed consent was obtained from the patient following explanation of the procedure, risks, benefits and alternatives. The patient understands, agrees and consents for the procedure. All questions were addressed.  A time out was performed.  Maximal barrier sterile technique utilized including caps, mask, sterile gowns, sterile gloves, large sterile drape, hand hygiene, and betadine  Initially, the right upper quadrant was sterilely prepped and draped.  After induction of general anesthesia, ultrasound percutaneous transhepatic access was performed of the right portal branch.  Contrast injection confirms transhepatic portal access. Guide wire advanced easily into the main portal vein followed by the Accustick dilator set.  This remains in place to serve as a "fluoroscopic target" during TIPS creation.  This was secured externally.  TIPS creation:  Attention was directed to the right neck.  Under sterile conditions and general anesthesia, right internal jugular vein access was performed with ultrasound.  Images obtained for documentation.  Guide wire advanced followed by tract dilatation to insert 10-French TIPS sheath.  TIPS sheath was advanced into the middle hepatic  vein.  Position confirmed with middle hepatic venography.  The TIPS needle was advanced through the sheath and several passes were made to eventually puncture the right portal vein peripheral to the confluence from the middle hepatic vein approach.  Contrast injection confirms portal access.  A glide wire was advanced easily into the portal vein.  5-French catheter was advanced over the guide wire.  Glide wire was exchanged for an Amplatz guide wire.  Initial tract dilatation performed with a 6 mm Mustang balloon.  Balloon was exchanged for marker pigtail catheter.   Simultaneous portal and hepatic venography was performed in preparation for stent placement.  To create the TIPS, a 10 mm x 8 cm x 2 cm Viatorr stent was advanced through the delivery sheath and deployed from the right portal vein access into the hepatic venous confluence.  This was dilated with a 10 x 4 Conquest balloon to full profile.  Following insertion, a TIPS portogram was performed.  This demonstrated wide patency of the TIPS however there is persistent retrograde flow into a large gastric coronary varix with delayed imaging this eventually communicates to a splenorenal shunt.  Variceal embolization:  10 French sheath was advanced through the newly created TIPS into the portal vein.  6-French Imager II guide catheter was advanced over a glide wire into the large gastric coronary varix.  Contrast injection reconfirms position within the coronary varix.  Measurements obtained for the appropriate diameter.  Embolization performed with deployment of two 12 mm x 9 mm Amplatzer Plug II occlusion devices followed by a 15 mm x 20 cm interlock coil.  Follow-up variceal angiogram confirms complete occlusion of the large gastric coronary varix.  Final TIPS portogram performed with pressure measurements.  This demonstrates wide patency of the newly created TIPS with preferential flow through the shunt.  Large coronary varix is occluded.  Final portosystemic gradient is 9 mmHg.  Portal vein pressure 28 and right atrial pressure 19  TIPS sheath was removed.  Hemostasis obtained with compression followed by sterile dressing.  The percutaneous transhepatic portal access was retracted into the hepatic tract.  This was occluded by Gelfoam embolization.  The patient was extubated and awakened.  He was in stable condition to the PACU.  IMPRESSION: Successful creation of a transjugular intrahepatic portosystemic shunt (T I P S) from the middle hepatic vein to the right portal vein with insertion of a 10 x 80 x 20 Viatorr stent   Successful large gastric coronary varix embolization   Original Report Authenticated By: Judie Petit. Miles Costain, M.D.    Ir Transcath/emboliz  01/30/2012  *RADIOLOGY REPORT*  Clinical Data: Alcoholic cirrhosis, upper GI bleeding, endoscopy positive for large gastric varices, initially the patient was hemodynamically unstable requiring Protonix, octreotide infusion, and blood transfusions.  The patient has been stabilized and was transferred [place] [facility] for T I P S placement.  TIPS CREATION FROM THE MIDDLE HEPATIC VEIN TO THE RIGHT HEPATIC VEIN (VIATORR STENT INSERTION 10 MM X 8 CM X 2 CM)  LARGE GASTRIC CORONARY VARIX CATHETERIZATION, ANGIOGRAM AND EMBOLIZATION  Date:  01/30/2012 08:25:00  Radiologist:  Judie Petit. Ruel Favors, M.D.  Medications:  1 unit Platelets, 2 grams ancef administered within 1 hour of the procedure, followed by general anesthesia  Guidance:  Ultrasound fluoroscopic  Fluoroscopy time:  45.8 minutes  Sedation time:  General anesthesia  Contrast volume:  200 ml Omnipaque-300  Complications:  No immediate  PROCEDURE/FINDINGS:  Informed consent was obtained from the patient following explanation of the  procedure, risks, benefits and alternatives. The patient understands, agrees and consents for the procedure. All questions were addressed.  A time out was performed.  Maximal barrier sterile technique utilized including caps, mask, sterile gowns, sterile gloves, large sterile drape, hand hygiene, and betadine  Initially, the right upper quadrant was sterilely prepped and draped.  After induction of general anesthesia, ultrasound percutaneous transhepatic access was performed of the right portal branch.  Contrast injection confirms transhepatic portal access. Guide wire advanced easily into the main portal vein followed by the Accustick dilator set.  This remains in place to serve as a "fluoroscopic target" during TIPS creation.  This was secured externally.  TIPS creation:  Attention was  directed to the right neck.  Under sterile conditions and general anesthesia, right internal jugular vein access was performed with ultrasound.  Images obtained for documentation.  Guide wire advanced followed by tract dilatation to insert 10-French TIPS sheath.  TIPS sheath was advanced into the middle hepatic vein.  Position confirmed with middle hepatic venography.  The TIPS needle was advanced through the sheath and several passes were made to eventually puncture the right portal vein peripheral to the confluence from the middle hepatic vein approach.  Contrast injection confirms portal access.  A glide wire was advanced easily into the portal vein.  5-French catheter was advanced over the guide wire.  Glide wire was exchanged for an Amplatz guide wire.  Initial tract dilatation performed with a 6 mm Mustang balloon.  Balloon was exchanged for marker pigtail catheter.  Simultaneous portal and hepatic venography was performed in preparation for stent placement.  To create the TIPS, a 10 mm x 8 cm x 2 cm Viatorr stent was advanced through the delivery sheath and deployed from the right portal vein access into the hepatic venous confluence.  This was dilated with a 10 x 4 Conquest balloon to full profile.  Following insertion, a TIPS portogram was performed.  This demonstrated wide patency of the TIPS however there is persistent retrograde flow into a large gastric coronary varix with delayed imaging this eventually communicates to a splenorenal shunt.  Variceal embolization:  10 French sheath was advanced through the newly created TIPS into the portal vein.  6-French Imager II guide catheter was advanced over a glide wire into the large gastric coronary varix.  Contrast injection reconfirms position within the coronary varix.  Measurements obtained for the appropriate diameter.  Embolization performed with deployment of two 12 mm x 9 mm Amplatzer Plug II occlusion devices followed by a 15 mm x 20 cm interlock  coil.  Follow-up variceal angiogram confirms complete occlusion of the large gastric coronary varix.  Final TIPS portogram performed with pressure measurements.  This demonstrates wide patency of the newly created TIPS with preferential flow through the shunt.  Large coronary varix is occluded.  Final portosystemic gradient is 9 mmHg.  Portal vein pressure 28 and right atrial pressure 19  TIPS sheath was removed.  Hemostasis obtained with compression followed by sterile dressing.  The percutaneous transhepatic portal access was retracted into the hepatic tract.  This was occluded by Gelfoam embolization.  The patient was extubated and awakened.  He was in stable condition to the PACU.  IMPRESSION: Successful creation of a transjugular intrahepatic portosystemic shunt (T I P S) from the middle hepatic vein to the right portal vein with insertion of a 10 x 80 x 20 Viatorr stent  Successful large gastric coronary varix embolization   Original Report Authenticated By: Judie Petit.  Miles Costain, M.D.    Ir Tips  01/30/2012  *RADIOLOGY REPORT*  Clinical Data: Alcoholic cirrhosis, upper GI bleeding, endoscopy positive for large gastric varices, initially the patient was hemodynamically unstable requiring Protonix, octreotide infusion, and blood transfusions.  The patient has been stabilized and was [place] [facility] for T I P S placement.  TIPS CREATION FROM THE MIDDLE HEPATIC VEIN TO THE RIGHT HEPATIC VEIN (VIATORR STENT INSERTION 10 MM X 8 CM X 2 CM)  LARGE GASTRIC CORONARY VARIX CATHETERIZATION, ANGIOGRAM AND EMBOLIZATION  Date:  01/30/2012 08:25:00  Radiologist:  Judie Petit. Ruel Favors, M.D.  Medications:  1 unit Platelets, 2 grams ancef administered within 1 hour of the procedure, followed by general anesthesia  Guidance:  Ultrasound fluoroscopic  Fluoroscopy time:  45.8 minutes  Sedation time:  General anesthesia  Contrast volume:  200 ml Omnipaque-300  Complications:  No immediate  PROCEDURE/FINDINGS:   Informed consent was obtained from the patient following explanation of the procedure, risks, benefits and alternatives. The patient understands, agrees and consents for the procedure. All questions were addressed.  A time out was performed.  Maximal barrier sterile technique utilized including caps, mask, sterile gowns, sterile gloves, large sterile drape, hand hygiene, and betadine  Initially, the right upper quadrant was sterilely prepped and draped.  After induction of general anesthesia, ultrasound percutaneous transhepatic access was performed of the right portal branch.  Contrast injection confirms transhepatic portal access. Guide wire advanced easily into the main portal vein followed by the Accustick dilator set.  This remains in place to serve as a "fluoroscopic target" during TIPS creation.  This was secured externally.  TIPS creation:  Attention was directed to the right neck.  Under sterile conditions and general anesthesia, right internal jugular vein access was performed with ultrasound.  Images obtained for documentation.  Guide wire advanced followed by tract dilatation to insert 10-French TIPS sheath.  TIPS sheath was advanced into the middle hepatic vein.  Position confirmed with middle hepatic venography.  The TIPS needle was advanced through the sheath and several passes were made to eventually puncture the right portal vein peripheral to the confluence from the middle hepatic vein approach.  Contrast injection confirms portal access.  A glide wire was advanced easily into the portal vein.  5-French catheter was advanced over the guide wire.  Glide wire was exchanged for an Amplatz guide wire.  Initial tract dilatation performed with a 6 mm Mustang balloon.  Balloon was exchanged for marker pigtail catheter.  Simultaneous portal and hepatic venography was performed in preparation for stent placement.  To create the TIPS, a 10 mm x 8 cm x 2 cm Viatorr stent was advanced through the delivery  sheath and deployed from the right portal vein access into the hepatic venous confluence.  This was dilated with a 10 x 4 Conquest balloon to full profile.  Following insertion, a TIPS portogram was performed.  This demonstrated wide patency of the TIPS however there is persistent retrograde flow into a large gastric coronary varix with delayed imaging this eventually communicates to a splenorenal shunt.  Variceal embolization:  10 French sheath was advanced through the newly created TIPS into the portal vein.  6-French Imager II guide catheter was advanced over a glide wire into the large gastric coronary varix.  Contrast injection reconfirms position within the coronary varix.  Measurements obtained for the appropriate diameter.  Embolization performed with deployment of two 12 mm x 9 mm Amplatzer Plug II occlusion devices followed  by a 15 mm x 20 cm interlock coil.  Follow-up variceal angiogram confirms complete occlusion of the large gastric coronary varix.  Final TIPS portogram performed with pressure measurements.  This demonstrates wide patency of the newly created TIPS with preferential flow through the shunt.  Large coronary varix is occluded.  Final portosystemic gradient is 9 mmHg.  Portal vein pressure 28 and right atrial pressure 19  TIPS sheath was removed.  Hemostasis obtained with compression followed by sterile dressing.  The percutaneous transhepatic portal access was retracted into the hepatic tract.  This was occluded by Gelfoam embolization.  The patient was extubated and awakened.  He was in stable condition to the PACU.  IMPRESSION: Successful creation of a transjugular intrahepatic portosystemic shunt (T I P S) from the middle hepatic vein to the right portal vein with insertion of a 10 x 80 x 20 Viatorr stent  Successful large gastric coronary varix embolization   Original Report Authenticated By: Judie Petit. Miles Costain, M.D.     Microbiology: Recent Results (from the past 240 hour(s))  MRSA PCR  SCREENING     Status: Normal   Collection Time   01/29/12  3:15 PM      Component Value Range Status Comment   MRSA by PCR NEGATIVE  NEGATIVE Final      Labs: Basic Metabolic Panel:  Lab 02/01/12 4098 01/31/12 1451 01/31/12 0445 01/30/12 1018 01/29/12 1517  NA 137 136 135 -- 143  K 3.5 3.1* 2.9* -- 3.6  CL 106 102 101 -- 114*  CO2 25 25 27  -- 24  GLUCOSE 106* 193* 91 128* 141*  BUN 5* 5* 6 -- 19  CREATININE 0.52 0.64 0.52 -- 0.59  CALCIUM 7.8* 7.8* 7.8* -- 7.6*  MG -- 1.6 -- -- --  PHOS -- -- -- -- --   Liver Function Tests:  Lab 01/29/12 1517  AST 27  ALT 24  ALKPHOS 60  BILITOT 1.0  PROT 4.9*  ALBUMIN 2.4*   No results found for this basename: LIPASE:5,AMYLASE:5 in the last 168 hours No results found for this basename: AMMONIA:5 in the last 168 hours CBC:  Lab 02/01/12 0440 01/31/12 0445 01/29/12 1517  WBC 5.5 5.6 4.8  NEUTROABS -- -- --  HGB 9.3* 10.0* 9.3*  HCT 26.2* 28.3* 27.7*  MCV 92.9 91.9 96.5  PLT 47* 54* 43*   Cardiac Enzymes: No results found for this basename: CKTOTAL:5,CKMB:5,CKMBINDEX:5,TROPONINI:5 in the last 168 hours BNP: BNP (last 3 results) No results found for this basename: PROBNP:3 in the last 8760 hours CBG:  Lab 02/01/12 1114 02/01/12 0741 01/31/12 2141 01/31/12 1617 01/31/12 1200  GLUCAP 178* 96 131* 122* 209*       Signed:  Mieka Leaton  [facility] 02/01/2012, 1:46 PM

## 2012-02-01 NOTE — Progress Notes (Signed)
Utilization review completed.  

## 2012-02-01 NOTE — Progress Notes (Signed)
Pt discharged to home with family member, d/c instructions, f/u appt and prescriptions given. [name] [name]

## 2012-10-21 ENCOUNTER — Emergency Department (HOSPITAL_COMMUNITY): Payer: Self-pay

## 2012-10-21 ENCOUNTER — Inpatient Hospital Stay (HOSPITAL_COMMUNITY): Payer: Self-pay

## 2012-10-21 ENCOUNTER — Encounter (HOSPITAL_COMMUNITY): Payer: Self-pay | Admitting: *Deleted

## 2012-10-21 ENCOUNTER — Observation Stay (HOSPITAL_COMMUNITY)
Admission: EM | Admit: 2012-10-21 | Discharge: 2012-10-23 | DRG: 312 | Disposition: A | Discharge status: Home / Self Care | Payer: MEDICAID | Attending: Internal Medicine | Admitting: Internal Medicine

## 2012-10-21 DIAGNOSIS — R197 Diarrhea, unspecified: Secondary | ICD-10-CM | POA: Insufficient documentation

## 2012-10-21 DIAGNOSIS — K7030 Alcoholic cirrhosis of liver without ascites: Secondary | ICD-10-CM

## 2012-10-21 DIAGNOSIS — I8510 Secondary esophageal varices without bleeding: Secondary | ICD-10-CM

## 2012-10-21 DIAGNOSIS — R109 Unspecified abdominal pain: Secondary | ICD-10-CM

## 2012-10-21 DIAGNOSIS — R1033 Periumbilical pain: Secondary | ICD-10-CM | POA: Insufficient documentation

## 2012-10-21 DIAGNOSIS — G4733 Obstructive sleep apnea (adult) (pediatric): Secondary | ICD-10-CM | POA: Insufficient documentation

## 2012-10-21 DIAGNOSIS — D6959 Other secondary thrombocytopenia: Secondary | ICD-10-CM

## 2012-10-21 DIAGNOSIS — Z8719 Personal history of other diseases of the digestive system: Secondary | ICD-10-CM

## 2012-10-21 DIAGNOSIS — E119 Type 2 diabetes mellitus without complications: Secondary | ICD-10-CM

## 2012-10-21 DIAGNOSIS — R55 Syncope and collapse: Principal | ICD-10-CM

## 2012-10-21 DIAGNOSIS — F1010 Alcohol abuse, uncomplicated: Secondary | ICD-10-CM

## 2012-10-21 LAB — PROTIME-INR: Prothrombin Time: 17.7 seconds — ABNORMAL HIGH (ref 11.6–15.2)

## 2012-10-21 LAB — ETHANOL: Alcohol, Ethyl (B): <11 mg/dL (ref 0–11)

## 2012-10-21 LAB — CBC
MCV: 94.9 fL (ref 78.0–100.0)
Platelets: 47 10*3/uL — ABNORMAL LOW (ref 150–400)
RDW: 14.8 % (ref 11.5–15.5)
WBC: 3.8 10*3/uL — ABNORMAL LOW (ref 4.0–10.5)

## 2012-10-21 LAB — RAPID URINE DRUG SCREEN, HOSP PERFORMED
Barbiturates: NOT DETECTED
Tetrahydrocannabinol: NOT DETECTED

## 2012-10-21 LAB — GLUCOSE, CAPILLARY: Glucose-Capillary: 81 mg/dL (ref 70–99)

## 2012-10-21 LAB — CBC WITH DIFFERENTIAL/PLATELET
Lymphocytes Relative: 23 % (ref 12–46)
Lymphs Abs: 1.1 10*3/uL (ref 0.7–4.0)
Neutro Abs: 3.1 10*3/uL (ref 1.7–7.7)
Neutrophils Relative %: 62 % (ref 43–77)
Platelets: 63 10*3/uL — ABNORMAL LOW (ref 150–400)
RBC: 4.15 MIL/uL — ABNORMAL LOW (ref 4.22–5.81)
WBC: 5 10*3/uL (ref 4.0–10.5)

## 2012-10-21 LAB — COMPREHENSIVE METABOLIC PANEL
ALT: 24 U/L (ref 0–53)
Alkaline Phosphatase: 91 U/L (ref 39–117)
CO2: 22 mEq/L (ref 19–32)
GFR calc Af Amer: >90 mL/min (ref >90)
Glucose, Bld: 195 mg/dL — ABNORMAL HIGH (ref 70–99)
Potassium: 3.7 mEq/L (ref 3.5–5.1)
Sodium: 138 mEq/L (ref 135–145)
Total Protein: 6.1 g/dL (ref 6.0–8.3)

## 2012-10-21 LAB — URINALYSIS, ROUTINE W REFLEX MICROSCOPIC
Bilirubin Urine: NEGATIVE
Ketones, ur: NEGATIVE mg/dL
Nitrite: NEGATIVE
pH: 6.5 (ref 5.0–8.0)

## 2012-10-21 LAB — CREATININE, SERUM: GFR calc Af Amer: >90 mL/min (ref >90)

## 2012-10-21 LAB — LIPASE, BLOOD: Lipase: 54 U/L (ref 11–59)

## 2012-10-21 LAB — HEMOGLOBIN A1C: Hgb A1c MFr Bld: 5.9 % — ABNORMAL HIGH (ref <5.7)

## 2012-10-21 LAB — POCT I-STAT TROPONIN I

## 2012-10-21 MED ORDER — FOLIC ACID 1 MG PO TABS
1.0000 mg | ORAL_TABLET | Freq: Every day | ORAL | Status: DC
  Administered 2012-10-21 – 2012-10-23 (×3): 1 mg via ORAL
  Filled 2012-10-21 (×3): qty 1

## 2012-10-21 MED ORDER — VITAMIN B-1 100 MG PO TABS
100.0000 mg | ORAL_TABLET | Freq: Every day | ORAL | Status: DC
  Administered 2012-10-21 – 2012-10-23 (×3): 100 mg via ORAL
  Filled 2012-10-21 (×3): qty 1

## 2012-10-21 MED ORDER — SODIUM CHLORIDE 0.9 % IJ SOLN
3.0000 mL | Freq: Two times a day (BID) | INTRAMUSCULAR | Status: DC
  Administered 2012-10-21 – 2012-10-22 (×4): 3 mL via INTRAVENOUS

## 2012-10-21 MED ORDER — ADULT MULTIVITAMIN W/MINERALS CH
1.0000 | ORAL_TABLET | Freq: Every day | ORAL | Status: DC
  Administered 2012-10-21 – 2012-10-23 (×3): 1 via ORAL
  Filled 2012-10-21 (×3): qty 1

## 2012-10-21 MED ORDER — SPIRONOLACTONE 50 MG PO TABS
50.0000 mg | ORAL_TABLET | Freq: Every day | ORAL | Status: DC
  Administered 2012-10-21 – 2012-10-23 (×3): 50 mg via ORAL
  Filled 2012-10-21 (×3): qty 1

## 2012-10-21 MED ORDER — ENOXAPARIN SODIUM 40 MG/0.4ML ~~LOC~~ SOLN
40.0000 mg | SUBCUTANEOUS | Status: DC
  Administered 2012-10-21 – 2012-10-22 (×2): 40 mg via SUBCUTANEOUS
  Filled 2012-10-21 (×2): qty 0.4

## 2012-10-21 MED ORDER — ONDANSETRON HCL 4 MG/2ML IJ SOLN: 4.0000 mg | Freq: Four times a day (QID) | INTRAMUSCULAR | Status: DC | PRN

## 2012-10-21 MED ORDER — LORAZEPAM 2 MG/ML IJ SOLN: 1.0000 mg | Freq: Four times a day (QID) | INTRAMUSCULAR | Status: DC | PRN

## 2012-10-21 MED ORDER — OXYCODONE HCL 5 MG PO TABS
5.0000 mg | ORAL_TABLET | ORAL | Status: DC | PRN
  Administered 2012-10-21 – 2012-10-23 (×7): 5 mg via ORAL
  Filled 2012-10-21 (×7): qty 1

## 2012-10-21 MED ORDER — GADOBENATE DIMEGLUMINE 529 MG/ML IV SOLN
20.0000 mL | Freq: Once | INTRAVENOUS | Status: AC | PRN
  Administered 2012-10-21: 20 mL via INTRAVENOUS

## 2012-10-21 MED ORDER — ONDANSETRON HCL 4 MG PO TABS: 4.0000 mg | ORAL_TABLET | Freq: Four times a day (QID) | ORAL | Status: DC | PRN

## 2012-10-21 MED ORDER — MORPHINE SULFATE 2 MG/ML IJ SOLN: 1.0000 mg | INTRAMUSCULAR | Status: DC | PRN

## 2012-10-21 MED ORDER — ALUM & MAG HYDROXIDE-SIMETH 200-200-20 MG/5ML PO SUSP: 30.0000 mL | Freq: Four times a day (QID) | ORAL | Status: DC | PRN

## 2012-10-21 MED ORDER — SODIUM CHLORIDE 0.9 % IV SOLN: INTRAVENOUS | Status: DC

## 2012-10-21 MED ORDER — SODIUM CHLORIDE 0.9 % IV SOLN
INTRAVENOUS | Status: AC
  Administered 2012-10-21: 10:00:00 via INTRAVENOUS

## 2012-10-21 MED ORDER — PANTOPRAZOLE SODIUM 40 MG IV SOLR
40.0000 mg | Freq: Once | INTRAVENOUS | Status: AC
  Administered 2012-10-21: 40 mg via INTRAVENOUS
  Filled 2012-10-21: qty 40

## 2012-10-21 MED ORDER — PANTOPRAZOLE SODIUM 40 MG PO TBEC
40.0000 mg | DELAYED_RELEASE_TABLET | Freq: Every day | ORAL | Status: DC
  Administered 2012-10-21 – 2012-10-23 (×3): 40 mg via ORAL
  Filled 2012-10-21 (×4): qty 1

## 2012-10-21 MED ORDER — ALBUTEROL SULFATE (5 MG/ML) 0.5% IN NEBU: 2.5000 mg | INHALATION_SOLUTION | RESPIRATORY_TRACT | Status: DC | PRN

## 2012-10-21 NOTE — ED Notes (Signed)
C/oa headache also 

## 2012-10-21 NOTE — H&P (Signed)
PATIENT DETAILS Name: [name] [name] Age: 47 y.o. Sex: male Date of Birth: 1965-08-07 Admit Date: 10/21/2012 PCP:No primary provider on file.   CHIEF COMPLAINT:  3 episodes of loss of consciousness  HPI: [name] [name] is a 47 y.o. male with a Past Medical History of alcoholic liver cirrhosis, seizures who presents today with the above noted complaint. The patient was in his usual state of health until approximately 5 days ago when he started developing vague mid abdominal pain along with nausea and some episodes of vomiting he did describe the pain as sharp and radiating to his lower chest. He also claims that he had one syncopal episode 2 days prior to admission, and syncopal episodes one day prior to admission. He claims that during all of these episodes, he was watching TV and the next thing he realizes that he was on the floor. He denies any tongue bites or urinary or fecal incontinence. He presented to the emergency room with these complaints, try hospitalist was then asked to admit this patient for further evaluation and treatment. Patient denies any recent alcohol use, he claims that he last used alcohol approximately 2 years back. He also does give a history of worsening lower extremity edema for the past few days.  ALLERGIES:  No Known Allergies  PAST MEDICAL HISTORY: Past Medical History  Diagnosis Date  . Shortness of breath   . Sleep apnea   . Diabetes mellitus without complication   . GERD (gastroesophageal reflux disease)   . H/O hiatal hernia   . Seizures   . Headache(784.0)   . No pertinent past medical history     PAST SURGICAL HISTORY: Past Surgical History  Procedure Laterality Date  . Appendectomy    . No past surgeries    . Radiology with anesthesia  01/30/2012    Procedure: RADIOLOGY WITH ANESTHESIA;  Surgeon: Casimiro Needle T. Miles Costain, MD;  Location: [facility] OR;  Service: Radiology;  Laterality: N/A;    MEDICATIONS AT HOME: Prior to Admission medications   Not on  File    FAMILY HISTORY: No family history of coronary artery disease.  SOCIAL HISTORY:  reports that he quit smoking about 5 years ago. His smoking use included Cigarettes. He has a 2.5 pack-year smoking history. He has never used smokeless tobacco. He reports that he does not drink alcohol or use illicit drugs.  REVIEW OF SYSTEMS:  Constitutional:   No  weight loss, night sweats,  Fevers, chills, fatigue.  HEENT:    No headaches, Difficulty swallowing,Tooth/dental problems,Sore throat,  No sneezing, itching, ear ache, nasal congestion, post nasal drip,   Cardio-vascular: No chest pain,  Orthopnea, PND, swelling in lower extremities, anasarca,         dizziness, palpitations  GI:  No heartburn, indigestion, diarrhea, change in bowel habits, loss of appetite  Resp: No shortness of breath with exertion or at rest.  No excess mucus, no productive cough, No non-productive cough,  No coughing up of blood.No change in color of mucus.No wheezing.No chest wall deformity  Skin:  no rash or lesions.  GU:  no dysuria, change in color of urine, no urgency or frequency.  No flank pain.  Musculoskeletal: No joint pain or swelling.  No decreased range of motion.  No back pain.  Psych: No change in mood or affect. No depression or anxiety.  No memory loss.   PHYSICAL EXAM: Blood pressure 140/77, pulse 76, temperature 97.8 F (36.6 C), temperature source Axillary, resp. rate 18, weight 107.911 kg (237  lb 14.4 oz), SpO2 96.00%.  General appearance :Awake, alert, not in any distress. Speech Clear. Not toxic Looking HEENT: Atraumatic and Normocephalic, pupils equally reactive to light and accomodation Neck: supple, no JVD. No cervical lymphadenopathy.  Chest:Good air entry bilaterally, no added sounds  CVS: S1 S2 regular, no murmurs.  Abdomen: Bowel sounds present, Mildly tender in the periumbilical area and not distended with no gaurding, rigidity or rebound. Extremities: B/L Lower Ext  shows 2+ edema, both legs are warm to touch Neurology: Awake alert, and oriented X 3, CN II-XII intact, Non focal Skin:No Rash Wounds:N/A  LABS ON ADMISSION:   Recent Labs  10/21/12 0020  NA 138  K 3.7  CL 108  CO2 22  GLUCOSE 195*  BUN 5*  CREATININE 0.48*  CALCIUM 8.3*    Recent Labs  10/21/12 0020  AST 30  ALT 24  ALKPHOS 91  BILITOT 1.8*  PROT 6.1  ALBUMIN 2.8*   No results found for this basename: LIPASE, AMYLASE,  in the last 72 hours  Recent Labs  10/21/12 0020  WBC 5.0  NEUTROABS 3.1  HGB 14.3  HCT 39.4  MCV 94.9  PLT 63*   No results found for this basename: CKTOTAL, CKMB, CKMBINDEX, TROPONINI,  in the last 72 hours No results found for this basename: DDIMER,  in the last 72 hours No components found with this basename: POCBNP,    RADIOLOGIC STUDIES ON ADMISSION: Dg Chest 2 View  10/21/2012   *RADIOLOGY REPORT*  Clinical Data: Loss of consciousness  CHEST - 2 VIEW  Comparison: Prior radiograph from 01/29/2012  Findings: Cardiac and mediastinal silhouettes are stable in size and contour, and remain within normal limits.  The lungs are normally inflated.  No airspace consolidation, pleural effusion, or pulmonary edema is identified.  There is no pneumothorax.  Bony thorax is intact.  Metallic coils overlying the upper mid abdomen.  IMPRESSION: No acute cardiopulmonary process.   Original Report Authenticated By: Rise Mu, M.D.     EKG: Independently reviewed. NSR  ASSESSMENT AND PLAN: Present on Admission:  . Syncopal episodes - Patient claims to have had 3 episodes of syncope over the past 2 days, not sure what the exact etiology is. Given his past medical history of seizures (claims last seizure was one year ago), this is certainly in the differential.He denies any alcohol use for the past 2 years.For now we'll admit to telemetry, check an echo, MRI brain and an EEG. Will consult neurology if needed. Is awake and alert, however we'll go  ahead and check an ammonia level.  . Abdominal pain - Claims to have been in the periumbilical area that shoots up to his chest.He claims to have had few episodes of nausea and vomiting along with this abdominal pain.  - Given his prior history of EtOH use, we'll check a lipase level, get a abdominal ultrasound as well as a x-ray of his abdomen. He does have a history of cirrhosis, however clinically does not appear to have ascites therefore likelihood of SBP is low. - Will start him on a regular diet and see if he can tolerate this, if he starts having vomiting and worsening abdominal pain then we will downgraded him to liquid diet will keep him n.p.o.  - Follow clinical course, however on exam abdomen is very soft.   . Chest pain - Very atypical, claims that the abdominal pain shoots up to his chest. - Continue with PPI and as needed Maalox. - Monitor  in telemetry and cycle cardiac enzymes. - Even bilateral lower extremity edema (which is likely from cirrhosis), Will check a 2-D echo  . Alcoholic cirrhosis - Does have lower extremity edema, start Aldactone  - Check an abdominal ultrasound   . Thrombocytopenia, secondary - Secondary to cirrhosis - Monitor platelet count   . Hx of Alcohol abuse - Claims that he has been not using any alcohol for the past 2 years. Alcohol level on presentation is less than 15. - When necessary Ativan, MVI/thiamine/folate  Further plan will depend as patient's clinical course evolves and further radiologic and laboratory data become available. Patient will be monitored closely.  DVT Prophylaxis: Prophylactic Lovenox   Code Status: Full Code  Total time spent for admission equals 45 minutes.  [facility] Pager [phone]  If 7PM-7AM, please contact night-coverage [email or web address] Password [facility] 10/21/2012, 8:17 AM

## 2012-10-21 NOTE — ED Provider Notes (Addendum)
CSN: [id number]     Arrival date & time 10/21/12  0006 History     First MD Initiated Contact with Patient 10/21/12 0145     Chief Complaint  Patient presents with  . Loss of Consciousness   (Consider location/radiation/quality/duration/timing/severity/associated sxs/prior Treatment) The history is provided by the patient.   47 year old male states that he fainted twice today and once yesterday. There is no warning that is going to pass out and is not sure how long he has unconscious for. He has been having some generalized abdominal pain with nausea, vomiting, diarrhea for the last 4 days. Pain does radiate up towards his chest. There's moderate in intensity he rates it 5/10. He denies blood in emesis or stool. Pain and diarrhea are worse after eating. He has had similar symptoms in the past and does not recall what has been diagnosed with. However, he does have a history of cirrhosis of the liver. He denies recent alcohol ingestion. He has noted his feet a bit swollen for the last 4 days. Past Medical History  Diagnosis Date  . Shortness of breath   . Sleep apnea   . Diabetes mellitus without complication   . GERD (gastroesophageal reflux disease)   . H/O hiatal hernia   . Seizures   . Headache(784.0)   . No pertinent past medical history    Past Surgical History  Procedure Laterality Date  . Appendectomy    . No past surgeries    . Radiology with anesthesia  01/30/2012    Procedure: RADIOLOGY WITH ANESTHESIA;  Surgeon: Casimiro Needle T. Miles Costain, MD;  Location: [facility] OR;  Service: Radiology;  Laterality: N/A;   No family history on file. History  Substance Use Topics  . Smoking status: Former Smoker -- 0.25 packs/day for 10 years    Types: Cigarettes    Quit date: 09/30/2007  . Smokeless tobacco: Never Used  . Alcohol Use: No     Comment: quit a year ago    Review of Systems  All other systems reviewed and are negative.    Allergies  Review of patient's allergies indicates no  known allergies.  Home Medications  No current outpatient prescriptions on file. BP 155/75  Pulse 90  Temp(Src) 98.3 F (36.8 C)  Resp 20  SpO2 96% Physical Exam  Nursing note and vitals reviewed.  47 year old male, resting comfortably and in no acute distress. Vital signs are significant for hypertension with blood pressure 155/75. Oxygen saturation is 96%, which is normal. Head is normocephalic and atraumatic. PERRLA, EOMI. Oropharynx is clear. Neck is nontender and supple without adenopathy or JVD. Back is nontender and there is no CVA tenderness. Lungs are clear without rales, wheezes, or rhonchi. Chest is nontender. Heart has regular rate and rhythm without murmur. Abdomen is soft, flat, with moderate epigastric and right upper quadrant tenderness. Liver is enlarged to about 2 cm below the costal margin with slightly tender edge. There is no rebound or guarding. Peristalsis is normoactive. Extremities have 2+ edema, full range of motion is present. Skin is warm and dry without rash. Neurologic: Mental status is normal, cranial nerves are intact, there are no motor or sensory deficits.  ED Course   Procedures (including critical care time)  Results for orders placed during the hospital encounter of 10/21/12  CBC WITH DIFFERENTIAL      Result Value Range   WBC 5.0  4.0 - 10.5 K/uL   RBC 4.15 (*) 4.22 - 5.81 MIL/uL   Hemoglobin  14.3  13.0 - 17.0 g/dL   HCT 16.1  09.6 - 04.5 %   MCV 94.9  78.0 - 100.0 fL   MCH 34.5 (*) 26.0 - 34.0 pg   MCHC 36.3 (*) 30.0 - 36.0 g/dL   RDW 40.9  81.1 - 91.4 %   Platelets 63 (*) 150 - 400 K/uL   Neutrophils Relative % 62  43 - 77 %   Neutro Abs 3.1  1.7 - 7.7 K/uL   Lymphocytes Relative 23  12 - 46 %   Lymphs Abs 1.1  0.7 - 4.0 K/uL   Monocytes Relative 8  3 - 12 %   Monocytes Absolute 0.4  0.1 - 1.0 K/uL   Eosinophils Relative 7 (*) 0 - 5 %   Eosinophils Absolute 0.3  0.0 - 0.7 K/uL   Basophils Relative 1  0 - 1 %   Basophils  Absolute 0.0  0.0 - 0.1 K/uL  COMPREHENSIVE METABOLIC PANEL      Result Value Range   Sodium 138  135 - 145 mEq/L   Potassium 3.7  3.5 - 5.1 mEq/L   Chloride 108  96 - 112 mEq/L   CO2 22  19 - 32 mEq/L   Glucose, Bld 195 (*) 70 - 99 mg/dL   BUN 5 (*) 6 - 23 mg/dL   Creatinine, Ser 7.82 (*) 0.50 - 1.35 mg/dL   Calcium 8.3 (*) 8.4 - 10.5 mg/dL   Total Protein 6.1  6.0 - 8.3 g/dL   Albumin 2.8 (*) 3.5 - 5.2 g/dL   AST 30  0 - 37 U/L   ALT 24  0 - 53 U/L   Alkaline Phosphatase 91  39 - 117 U/L   Total Bilirubin 1.8 (*) 0.3 - 1.2 mg/dL   GFR calc non Af Amer >90  >90 mL/min   GFR calc Af Amer >90  >90 mL/min  CG4 I-STAT (LACTIC ACID)      Result Value Range   Lactic Acid, Venous 2.10  0.5 - 2.2 mmol/L  POCT I-STAT TROPONIN I      Result Value Range   Troponin i, poc 0.01  0.00 - 0.08 ng/mL   Comment 3            Dg Chest 2 View  10/21/2012   *RADIOLOGY REPORT*  Clinical Data: Loss of consciousness  CHEST - 2 VIEW  Comparison: Prior radiograph from 01/29/2012  Findings: Cardiac and mediastinal silhouettes are stable in size and contour, and remain within normal limits.  The lungs are normally inflated.  No airspace consolidation, pleural effusion, or pulmonary edema is identified.  There is no pneumothorax.  Bony thorax is intact.  Metallic coils overlying the upper mid abdomen.  IMPRESSION: No acute cardiopulmonary process.   Original Report Authenticated By: Rise Mu, M.D.     Date: 10/21/2012  Rate: 88  Rhythm: normal sinus rhythm  QRS Axis: normal  Intervals: normal  ST/T Wave abnormalities: normal  Conduction Disutrbances:incomplete right bundle branch block  Narrative Interpretation: Incomplete right bundle branch block. When compared with ECG of 02/25/2006, no significant changes are seen.  Old EKG Reviewed: none available   1. Syncopal episodes   2. Abdominal pain     MDM  Syncopal episodes of uncertain cause. Abdominal pain most likely related to his known  history of cirrhosis. Edema most likely from his known history of cirrhosis. Old records are reviewed and he has several hospitalizations for bleeding varices but no workups for syncope.  He states that he has also been in the hospital at [place] and other hospitals and perhaps his syncope workups were at those locations. Currently, his chest pain seems to be a manifestation of his abdominal problems. However, he will need inpatient workup.  Workup is unremarkable. He does not exhibit any orthostatic vital sign changes, lactic acid level is normal, bilirubin slightly elevated but transaminases are normal. Case is discussed with Dr. Lovell Sheehan of [facility] who agrees to admit the patient.     Dione Booze, MD 10/21/12 1610  Dione Booze, MD 11/01/12 [phone]

## 2012-10-21 NOTE — ED Notes (Signed)
The pt reports that he fainted x 2 today.  He has history of the same.  Swollen feet and ankles

## 2012-10-22 ENCOUNTER — Encounter (HOSPITAL_COMMUNITY): Payer: Self-pay | Admitting: *Deleted

## 2012-10-22 DIAGNOSIS — F1010 Alcohol abuse, uncomplicated: Secondary | ICD-10-CM

## 2012-10-22 DIAGNOSIS — I517 Cardiomegaly: Secondary | ICD-10-CM

## 2012-10-22 LAB — CBC
HCT: 40.1 % (ref 39.0–52.0)
Hemoglobin: 14.1 g/dL (ref 13.0–17.0)
MCH: 33.4 pg (ref 26.0–34.0)
MCHC: 35.2 g/dL (ref 30.0–36.0)
MCV: 95 fL (ref 78.0–100.0)

## 2012-10-22 LAB — GLUCOSE, CAPILLARY
Glucose-Capillary: 209 mg/dL — ABNORMAL HIGH (ref 70–99)
Glucose-Capillary: 179 mg/dL — ABNORMAL HIGH (ref 70–99)

## 2012-10-22 LAB — COMPREHENSIVE METABOLIC PANEL
ALT: 22 U/L (ref 0–53)
AST: 28 U/L (ref 0–37)
Albumin: 2.6 g/dL — ABNORMAL LOW (ref 3.5–5.2)
Alkaline Phosphatase: 76 U/L (ref 39–117)
Calcium: 8.6 mg/dL (ref 8.4–10.5)
Potassium: 3.8 mEq/L (ref 3.5–5.1)
Sodium: 138 mEq/L (ref 135–145)
Total Protein: 5.4 g/dL — ABNORMAL LOW (ref 6.0–8.3)

## 2012-10-22 LAB — CLOSTRIDIUM DIFFICILE BY PCR: Toxigenic C. Difficile by PCR: NEGATIVE

## 2012-10-22 MED ORDER — SACCHAROMYCES BOULARDII 250 MG PO CAPS
250.0000 mg | ORAL_CAPSULE | Freq: Two times a day (BID) | ORAL | Status: DC
  Administered 2012-10-22 – 2012-10-23 (×3): 250 mg via ORAL
  Filled 2012-10-22 (×4): qty 1

## 2012-10-22 NOTE — Progress Notes (Signed)
PATIENT DETAILS Name: [name] [name] Age: 47 y.o. Sex: male Date of Birth: Sep 22, 1965 Admit Date: 10/21/2012 Admitting Physician Ron Parker, MD PCP:No primary provider on file.  Subjective: No major issues  Assessment/Plan: Principal Problem:  Syncopal episodes -?seizure vs neuro cardiogenic -c/w telemetry monitoring, await Echo -MRI Brain-neg, EEG pending -appreciate Neuro eval  Active Problems: Abdominal pain -belly very soft-no ascites seen on Ultrasound abdomen. Xray abdomen-does not show any acute abnormalities. Lipase within normal limits. -tolerating diet -?etiology-?GERD  Chest pain  - Very atypical, claims that the abdominal pain shoots up to his chest.  - Continue with PPI and as needed Maalox. -Troponin neg  Diarrhea -?etiology -await C Diff PCR -start Probiotic  Alcoholic cirrhosis -stable -leg edema much improved-after starting Aldactone  Thrombocytopenia, secondary  - Secondary to cirrhosis  - Monitor platelet count   . Hx of Alcohol abuse  - Claims that he has been not using any alcohol for the past 2 years. Alcohol level on presentation is less than 15.  - prn Ativan, MVI/thiamine/folate  Disposition: Remain inpatient  DVT Prophylaxis:  SCD's-stop Lovenox given thrombocytopenia  Code Status: Full code   Family Communication Girlfriend at bedside  Procedures:  None  CONSULTS:  neurology   MEDICATIONS: Scheduled Meds: . enoxaparin (LOVENOX) injection  40 mg Subcutaneous Q24H  . folic acid  1 mg Oral Daily  . multivitamin with minerals  1 tablet Oral Daily  . pantoprazole  40 mg Oral Q1200  . saccharomyces boulardii  250 mg Oral BID  . sodium chloride  3 mL Intravenous Q12H  . spironolactone  50 mg Oral Daily  . thiamine  100 mg Oral Daily   Continuous Infusions:  PRN Meds:.albuterol, alum & mag hydroxide-simeth, LORazepam, morphine injection, ondansetron (ZOFRAN) IV, ondansetron,  oxyCODONE  Antibiotics: Anti-infectives   None       PHYSICAL EXAM: Vital signs in last 24 hours: Filed Vitals:   10/21/12 2100 10/22/12 0500 10/22/12 1000 10/22/12 1318  BP: 149/81 118/64 127/63 140/75  Pulse: 70 78 77 88  Temp: 97.7 F (36.5 C) 97.6 F (36.4 C)  97.9 F (36.6 C)  TempSrc:    Oral  Resp: 20 18  20   Height:      Weight:      SpO2: 98% 96%  97%    Weight change:  Filed Weights   10/21/12 0606 10/21/12 0615  Weight: 107.865 kg (237 lb 12.8 oz) 107.911 kg (237 lb 14.4 oz)   Body mass index is 33.2 kg/(m^2).   Gen Exam: Awake and alert with clear speech.   Neck: Supple, No JVD.   Chest: B/L Clear.   CVS: S1 S2 Regular, no murmurs.  Abdomen: soft, BS +, non tender, non distended.  Extremities: trace edema, lower extremities warm to touch Neurologic: Non Focal.  Skin: No Rash.  Wounds: N/A.    Intake/Output from previous day:  Intake/Output Summary (Last 24 hours) at 10/22/12 1451 Last data filed at 10/22/12 1300  Gross per 24 hour  Intake   1080 ml  Output    525 ml  Net    555 ml     LAB RESULTS: CBC  Recent Labs Lab 10/21/12 0020 10/21/12 0923 10/22/12 0455  WBC 5.0 3.8* 4.2  HGB 14.3 13.0 14.1  HCT 39.4 35.6* 40.1  PLT 63* 47* 55*  MCV 94.9 94.9 95.0  MCH 34.5* 34.7* 33.4  MCHC 36.3* 36.5* 35.2  RDW 14.8 14.8 14.7  LYMPHSABS 1.1  --   --  MONOABS 0.4  --   --   EOSABS 0.3  --   --   BASOSABS 0.0  --   --     Chemistries   Recent Labs Lab 10/21/12 0020 10/21/12 0923 10/22/12 0455  NA 138  --  138  K 3.7  --  3.8  CL 108  --  108  CO2 22  --  22  GLUCOSE 195*  --  110*  BUN 5*  --  5*  CREATININE 0.48* 0.49* 0.51  CALCIUM 8.3*  --  8.6    CBG:  Recent Labs Lab 10/21/12 1317 10/21/12 1659 10/21/12 2042 10/22/12 0737 10/22/12 1209  GLUCAP 107* 147* 127* 96 209*    GFR Estimated Creatinine Clearance: 141 ml/min (by C-G formula based on Cr of 0.51).  Coagulation profile  Recent Labs Lab  10/21/12 0923  INR 1.50*    Cardiac Enzymes  Recent Labs Lab 10/21/12 0923 10/21/12 1335 10/21/12 2020  TROPONINI <0.30 <0.30 <0.30    No components found with this basename: POCBNP,  No results found for this basename: DDIMER,  in the last 72 hours  Recent Labs  10/21/12 0923  HGBA1C 5.9*   No results found for this basename: CHOL, HDL, LDLCALC, TRIG, CHOLHDL, LDLDIRECT,  in the last 72 hours No results found for this basename: TSH, T4TOTAL, FREET3, T3FREE, THYROIDAB,  in the last 72 hours No results found for this basename: VITAMINB12, FOLATE, FERRITIN, TIBC, IRON, RETICCTPCT,  in the last 72 hours  Recent Labs  10/21/12 0923  LIPASE 54    Urine Studies No results found for this basename: UACOL, UAPR, USPG, UPH, UTP, UGL, UKET, UBIL, UHGB, UNIT, UROB, ULEU, UEPI, UWBC, URBC, UBAC, CAST, CRYS, UCOM, BILUA,  in the last 72 hours  MICROBIOLOGY: No results found for this or any previous visit (from the past 240 hour(s)).  RADIOLOGY STUDIES/RESULTS: Dg Chest 2 View  10/21/2012   *RADIOLOGY REPORT*  Clinical Data: Loss of consciousness  CHEST - 2 VIEW  Comparison: Prior radiograph from 01/29/2012  Findings: Cardiac and mediastinal silhouettes are stable in size and contour, and remain within normal limits.  The lungs are normally inflated.  No airspace consolidation, pleural effusion, or pulmonary edema is identified.  There is no pneumothorax.  Bony thorax is intact.  Metallic coils overlying the upper mid abdomen.  IMPRESSION: No acute cardiopulmonary process.   Original Report Authenticated By: Rise Mu, M.D.   Mr Laqueta Jean ZO Contrast  10/21/2012   *RADIOLOGY REPORT*  Clinical Data: Seizures.  Cirrhosis.  MRI HEAD WITHOUT AND WITH CONTRAST  Technique:  Multiplanar, multiecho pulse sequences of the brain and surrounding structures were obtained according to standard protocol without and with intravenous contrast  Contrast: 20mL MULTIHANCE GADOBENATE DIMEGLUMINE 529  MG/ML IV SOLN  Comparison: Head CT 09/21/2004  Findings: Diffusion imaging does not show any acute or subacute infarction.  The brainstem and cerebellum are normal.  The cerebral hemispheres show mild generalized atrophy with minimal small vessel change of the hemispheric deep white matter.  T1 hyperintensity within the thalami and basal ganglia is associated with chronic liver disease.  No mass lesion, hemorrhage, hydrocephalus or extra- axial collection.  No pituitary mass.  No inflammatory sinus disease.  After contrast administration, no abnormal enhancement occurs.  IMPRESSION: No acute finding.  No specific cause of seizure.  T1 hyperintensity affecting the thalami and basal ganglia, a finding seen and chronic liver disease.   Original Report Authenticated By: Paulina Fusi, M.D.  US Abdomen Limited  10/21/2012     *RADIOLOGY REPORT*  Limited abdominal ultrasound  History:  Abdominal distension  Findings:  There is no demonstrable ascites.  There are multiple loops of peristaltic bowel in the abdomen.  Conclusion:  No demonstrable ascites.   Original Report Authenticated By: Bretta Bang, M.D.   Acute Abdominal Series  10/21/2012   *RADIOLOGY REPORT*  Clinical Data: Syncope x3 yesterday.  Vomiting and diarrhea and abdominal pain.  ACUTE ABDOMEN SERIES (ABDOMEN 2 VIEW & CHEST 1 VIEW)  Comparison: Chest radiograph, 10/21/2012 obtained earlier today.  Findings: There is a normal bowel gas pattern.  There is no obstruction or free air.  The stent lies in the right upper quadrant system with a TIPS procedure.  There is been a cholecystectomy.  A wire coil lies in the left upper mid abdomen superimposed on the left upper endplate of L1.  The soft tissues are otherwise unremarkable.  The lungs are clear.  The heart, mediastinum and hila are unremarkable.  IMPRESSION: No acute findings.  No evidence of obstruction or free air.   Original Report Authenticated By: Amie Portland, M.D.    Jeoffrey Massed,  MD  [facility] Pager:[phone]  If 7PM-7AM, please contact night-coverage [email or web address] Password [email or web address] 10/22/2012, 2:51 PM   LOS: 1 day

## 2012-10-22 NOTE — Progress Notes (Signed)
  Echocardiogram 2D Echocardiogram has been performed.  [place] 10/22/2012, 3:54 PM

## 2012-10-22 NOTE — Consult Note (Signed)
NEURO HOSPITALIST CONSULT NOTE    Reason for Consult: seizures versus syncope  HPI:                                                                                                                                          [name] [name] is an 47 y.o. male with a pat medical history significant for DM, OSA, former ETOH abuse, GERD, admitted to the hospital for further evaluation of possible syncopal episodes versus seizures. He indicated that in the past couple of days he had experienced 2 episodes of collapsing to the ground unconscious after standing up too quickly. He said that in both episodes he had a warm sensation, got sweaty, dizzy, and then collapsed to the floor. He said that he was unconscious and little confused for few seconds only and there was not incontinence of bladder or stools or tongue biting. No new medications. According to Mr. Matera, last year he had 2 episodes few months apart in which he had " brief convulsions" associated with passing out. Of note, he said that one of those episodes occurred in the context of heavy alcohol consumption. He was admitted to the hospital back then but never started on anti-seizure medications. Admits to frequently feeling " dizzy and off balance". Frequent headache but denies double vision, difficulty swallowing, focal weakness or numbness, slurred speech, language or vision impairment. No history of febrile seizures, CNS infection, severe head injury or stroke. No family history of epilepsy. He ws born full term, product of a normal pregnancy, no neonatal/perinatal complications. MRI brain with and without contrast performed during this admission is essentially unremarkable. EEG pending.    Past Medical History  Diagnosis Date  . Shortness of breath   . Sleep apnea   . Diabetes mellitus without complication   . GERD (gastroesophageal reflux disease)   . H/O hiatal hernia   . Seizures   . Headache(784.0)   . No  pertinent past medical history     Past Surgical History  Procedure Laterality Date  . Appendectomy    . No past surgeries    . Radiology with anesthesia  01/30/2012    Procedure: RADIOLOGY WITH ANESTHESIA;  Surgeon: Casimiro Needle T. Miles Costain, MD;  Location: [facility] OR;  Service: Radiology;  Laterality: N/A;    History reviewed. No pertinent family history.  Family History: no epilepsy   Social History:  reports that he quit smoking about 5 years ago. His smoking use included Cigarettes. He has a 2.5 pack-year smoking history. He has never used smokeless tobacco. He reports that he does not drink alcohol or use illicit drugs.  No Known Allergies  MEDICATIONS:  I have reviewed the patient's current medications.   ROS:                                                                                                                                       History obtained from the patient and chart review.  General ROS: negative for - chills, fatigue, fever, night sweats, weight gain or weight loss Psychological ROS: negative for - behavioral disorder, hallucinations, memory difficulties, mood swings or suicidal ideation Ophthalmic ROS: negative for - blurry vision, double vision, eye pain or loss of vision ENT ROS: negative for - epistaxis, nasal discharge, oral lesions, sore throat, tinnitus or vertigo Allergy and Immunology ROS: negative for - hives or itchy/watery eyes Hematological and Lymphatic ROS: negative for - bleeding problems, bruising or swollen lymph nodes Endocrine ROS: negative for - galactorrhea, hair pattern changes, polydipsia/polyuria or temperature intolerance Respiratory ROS: negative for - cough, hemoptysis, shortness of breath or wheezing Cardiovascular ROS: negative for - chest pain, dyspnea on exertion, edema or irregular heartbeat Gastrointestinal ROS: negative  for - abdominal pain, diarrhea, hematemesis, nausea/vomiting or stool incontinence Genito-Urinary ROS: negative for - dysuria, hematuria, incontinence or urinary frequency/urgency Musculoskeletal ROS: negative for - joint swelling or muscular weakness Neurological ROS: as noted in HPI Dermatological ROS: negative for rash and skin lesion changes      Physical exam: pleasant male in no apparent distress. Head: normocephalic. Neck: supple, no bruits, no JVD. Cardiac: no murmurs. Lungs: clear. Abdomen: soft, no tender, no mass. Extremities: no edema.  Blood pressure 127/63, pulse 77, temperature 97.6 F (36.4 C), temperature source Oral, resp. rate 18, height 5\' 11"  (1.803 m), weight 107.911 kg (237 lb 14.4 oz), SpO2 96.00%.   Neurologic Examination:                                                                                                      Mental Status: Alert, oriented, thought content appropriate.  Speech fluent without evidence of aphasia.  Able to follow 3 step commands without difficulty. Cranial Nerves: II: Discs flat bilaterally; Visual fields grossly normal, pupils equal, round, reactive to light and accommodation III,IV, VI: ptosis not present, extra-ocular motions intact bilaterally V,VII: smile symmetric, facial light touch sensation normal bilaterally VIII: hearing normal bilaterally IX,X: gag reflex present XI: bilateral shoulder shrug XII: midline tongue extension Motor: Right : Upper extremity   5/5    Left:     Upper extremity   5/5  Lower extremity   5/5  Lower extremity   5/5 Tone and bulk:normal tone throughout; no atrophy noted Sensory: Pinprick and light touch intact throughout, bilaterally Deep Tendon Reflexes:  2+ all over  Plantars: Right: downgoing   Left: downgoing Cerebellar: normal finger-to-nose,  normal heel-to-shin test Gait:  No ataxia. CV: pulses palpable throughout    No results found for this basename: cbc, bmp, coags,  chol, tri, ldl, hga1c    Results for orders placed during the hospital encounter of 10/21/12 (from the past 48 hour(s))  CBC WITH DIFFERENTIAL     Status: Abnormal   Collection Time    10/21/12 12:20 AM      Result Value Range   WBC 5.0  4.0 - 10.5 K/uL   RBC 4.15 (*) 4.22 - 5.81 MIL/uL   Hemoglobin 14.3  13.0 - 17.0 g/dL   HCT 40.9  81.1 - 91.4 %   MCV 94.9  78.0 - 100.0 fL   MCH 34.5 (*) 26.0 - 34.0 pg   MCHC 36.3 (*) 30.0 - 36.0 g/dL   RDW 78.2  95.6 - 21.3 %   Platelets 63 (*) 150 - 400 K/uL   Comment: CONSISTENT WITH PREVIOUS RESULT   Neutrophils Relative % 62  43 - 77 %   Neutro Abs 3.1  1.7 - 7.7 K/uL   Lymphocytes Relative 23  12 - 46 %   Lymphs Abs 1.1  0.7 - 4.0 K/uL   Monocytes Relative 8  3 - 12 %   Monocytes Absolute 0.4  0.1 - 1.0 K/uL   Eosinophils Relative 7 (*) 0 - 5 %   Eosinophils Absolute 0.3  0.0 - 0.7 K/uL   Basophils Relative 1  0 - 1 %   Basophils Absolute 0.0  0.0 - 0.1 K/uL  COMPREHENSIVE METABOLIC PANEL     Status: Abnormal   Collection Time    10/21/12 12:20 AM      Result Value Range   Sodium 138  135 - 145 mEq/L   Potassium 3.7  3.5 - 5.1 mEq/L   Chloride 108  96 - 112 mEq/L   CO2 22  19 - 32 mEq/L   Glucose, Bld 195 (*) 70 - 99 mg/dL   BUN 5 (*) 6 - 23 mg/dL   Creatinine, Ser 0.86 (*) 0.50 - 1.35 mg/dL   Calcium 8.3 (*) 8.4 - 10.5 mg/dL   Total Protein 6.1  6.0 - 8.3 g/dL   Albumin 2.8 (*) 3.5 - 5.2 g/dL   AST 30  0 - 37 U/L   ALT 24  0 - 53 U/L   Alkaline Phosphatase 91  39 - 117 U/L   Total Bilirubin 1.8 (*) 0.3 - 1.2 mg/dL   GFR calc non Af Amer >90  >90 mL/min   GFR calc Af Amer >90  >90 mL/min   Comment: (NOTE)     The eGFR has been calculated using the CKD EPI equation.     This calculation has not been validated in all clinical situations.     eGFR's persistently <90 mL/min signify possible Chronic Kidney     Disease.  CG4 I-STAT (LACTIC ACID)     Status: None   Collection Time    10/21/12  1:04 AM      Result Value Range    Lactic Acid, Venous 2.10  0.5 - 2.2 mmol/L  POCT I-STAT TROPONIN I     Status: None   Collection Time    10/21/12  2:12 AM  Result Value Range   Troponin i, poc 0.01  0.00 - 0.08 ng/mL   Comment 3            Comment: Due to the release kinetics of cTnI,     a negative result within the first hours     of the onset of symptoms does not rule out     myocardial infarction with certainty.     If myocardial infarction is still suspected,     repeat the test at appropriate intervals.  URINALYSIS, ROUTINE W REFLEX MICROSCOPIC     Status: Abnormal   Collection Time    10/21/12  4:33 AM      Result Value Range   Color, Urine YELLOW  YELLOW   APPearance CLEAR  CLEAR   Specific Gravity, Urine 1.013  1.005 - 1.030   pH 6.5  5.0 - 8.0   Glucose, UA 250 (*) NEGATIVE mg/dL   Hgb urine dipstick NEGATIVE  NEGATIVE   Bilirubin Urine NEGATIVE  NEGATIVE   Ketones, ur NEGATIVE  NEGATIVE mg/dL   Protein, ur NEGATIVE  NEGATIVE mg/dL   Urobilinogen, UA 1.0  0.0 - 1.0 mg/dL   Nitrite NEGATIVE  NEGATIVE   Leukocytes, UA NEGATIVE  NEGATIVE   Comment: MICROSCOPIC NOT DONE ON URINES WITH NEGATIVE PROTEIN, BLOOD, LEUKOCYTES, NITRITE, OR GLUCOSE <1000 mg/dL.  ETHANOL     Status: None   Collection Time    10/21/12  4:58 AM      Result Value Range   Alcohol, Ethyl (B) <11  0 - 11 mg/dL   Comment:            LOWEST DETECTABLE LIMIT FOR     SERUM ALCOHOL IS 11 mg/dL     FOR MEDICAL PURPOSES ONLY  GLUCOSE, CAPILLARY     Status: None   Collection Time    10/21/12  8:08 AM      Result Value Range   Glucose-Capillary 81  70 - 99 mg/dL   Comment 1 Notify RN    AMMONIA     Status: Abnormal   Collection Time    10/21/12  9:00 AM      Result Value Range   Ammonia 121 (*) 11 - 60 umol/L  CBC     Status: Abnormal   Collection Time    10/21/12  9:23 AM      Result Value Range   WBC 3.8 (*) 4.0 - 10.5 K/uL   RBC 3.75 (*) 4.22 - 5.81 MIL/uL   Hemoglobin 13.0  13.0 - 17.0 g/dL   HCT 29.5 (*) 62.1 -  52.0 %   MCV 94.9  78.0 - 100.0 fL   MCH 34.7 (*) 26.0 - 34.0 pg   MCHC 36.5 (*) 30.0 - 36.0 g/dL   RDW 30.8  65.7 - 84.6 %   Platelets 47 (*) 150 - 400 K/uL   Comment: CONSISTENT WITH PREVIOUS RESULT  CREATININE, SERUM     Status: Abnormal   Collection Time    10/21/12  9:23 AM      Result Value Range   Creatinine, Ser 0.49 (*) 0.50 - 1.35 mg/dL   GFR calc non Af Amer >90  >90 mL/min   GFR calc Af Amer >90  >90 mL/min   Comment: (NOTE)     The eGFR has been calculated using the CKD EPI equation.     This calculation has not been validated in all clinical situations.     eGFR's persistently <90 mL/min  signify possible Chronic Kidney     Disease.  TROPONIN I     Status: None   Collection Time    10/21/12  9:23 AM      Result Value Range   Troponin I <0.30  <0.30 ng/mL   Comment:            Due to the release kinetics of cTnI,     a negative result within the first hours     of the onset of symptoms does not rule out     myocardial infarction with certainty.     If myocardial infarction is still suspected,     repeat the test at appropriate intervals.  PROTIME-INR     Status: Abnormal   Collection Time    10/21/12  9:23 AM      Result Value Range   Prothrombin Time 17.7 (*) 11.6 - 15.2 seconds   INR 1.50 (*) 0.00 - 1.49  LIPASE, BLOOD     Status: None   Collection Time    10/21/12  9:23 AM      Result Value Range   Lipase 54  11 - 59 U/L  HEMOGLOBIN A1C     Status: Abnormal   Collection Time    10/21/12  9:23 AM      Result Value Range   Hemoglobin A1C 5.9 (*) <5.7 %   Comment: (NOTE)                                                                               According to the ADA Clinical Practice Recommendations for 2011, when     HbA1c is used as a screening test:      >=6.5%   Diagnostic of Diabetes Mellitus               (if abnormal result is confirmed)     5.7-6.4%   Increased risk of developing Diabetes Mellitus     References:Diagnosis and Classification  of Diabetes Mellitus,Diabetes     Care,2011,34(Suppl 1):S62-S69 and Standards of Medical Care in             Diabetes - 2011,Diabetes Care,2011,34 (Suppl 1):S11-S61.   Mean Plasma Glucose 123 (*) <117 mg/dL   Comment: Performed at [facility]  URINE RAPID DRUG SCREEN (HOSP PERFORMED)     Status: None   Collection Time    10/21/12 10:36 AM      Result Value Range   Opiates NONE DETECTED  NONE DETECTED   Cocaine NONE DETECTED  NONE DETECTED   Benzodiazepines NONE DETECTED  NONE DETECTED   Amphetamines NONE DETECTED  NONE DETECTED   Tetrahydrocannabinol NONE DETECTED  NONE DETECTED   Barbiturates NONE DETECTED  NONE DETECTED   Comment:            DRUG SCREEN FOR MEDICAL PURPOSES     ONLY.  IF CONFIRMATION IS NEEDED     FOR ANY PURPOSE, NOTIFY LAB     WITHIN 5 DAYS.                LOWEST DETECTABLE LIMITS     FOR URINE DRUG SCREEN     Drug Class  Cutoff (ng/mL)     Amphetamine      1000     Barbiturate      200     Benzodiazepine   200     Tricyclics       300     Opiates          300     Cocaine          300     THC              50  GLUCOSE, CAPILLARY     Status: Abnormal   Collection Time    10/21/12  1:17 PM      Result Value Range   Glucose-Capillary 107 (*) 70 - 99 mg/dL   Comment 1 Notify RN    TROPONIN I     Status: None   Collection Time    10/21/12  1:35 PM      Result Value Range   Troponin I <0.30  <0.30 ng/mL   Comment:            Due to the release kinetics of cTnI,     a negative result within the first hours     of the onset of symptoms does not rule out     myocardial infarction with certainty.     If myocardial infarction is still suspected,     repeat the test at appropriate intervals.  GLUCOSE, CAPILLARY     Status: Abnormal   Collection Time    10/21/12  4:59 PM      Result Value Range   Glucose-Capillary 147 (*) 70 - 99 mg/dL   Comment 1 Notify RN    TROPONIN I     Status: None   Collection Time    10/21/12  8:20 PM      Result  Value Range   Troponin I <0.30  <0.30 ng/mL   Comment:            Due to the release kinetics of cTnI,     a negative result within the first hours     of the onset of symptoms does not rule out     myocardial infarction with certainty.     If myocardial infarction is still suspected,     repeat the test at appropriate intervals.  GLUCOSE, CAPILLARY     Status: Abnormal   Collection Time    10/21/12  8:42 PM      Result Value Range   Glucose-Capillary 127 (*) 70 - 99 mg/dL  COMPREHENSIVE METABOLIC PANEL     Status: Abnormal   Collection Time    10/22/12  4:55 AM      Result Value Range   Sodium 138  135 - 145 mEq/L   Potassium 3.8  3.5 - 5.1 mEq/L   Chloride 108  96 - 112 mEq/L   CO2 22  19 - 32 mEq/L   Glucose, Bld 110 (*) 70 - 99 mg/dL   BUN 5 (*) 6 - 23 mg/dL   Creatinine, Ser 1.61  0.50 - 1.35 mg/dL   Calcium 8.6  8.4 - 09.6 mg/dL   Total Protein 5.4 (*) 6.0 - 8.3 g/dL   Albumin 2.6 (*) 3.5 - 5.2 g/dL   AST 28  0 - 37 U/L   ALT 22  0 - 53 U/L   Alkaline Phosphatase 76  39 - 117 U/L   Total Bilirubin 2.4 (*) 0.3 - 1.2 mg/dL  GFR calc non Af Amer >90  >90 mL/min   GFR calc Af Amer >90  >90 mL/min   Comment: (NOTE)     The eGFR has been calculated using the CKD EPI equation.     This calculation has not been validated in all clinical situations.     eGFR's persistently <90 mL/min signify possible Chronic Kidney     Disease.  CBC     Status: Abnormal   Collection Time    10/22/12  4:55 AM      Result Value Range   WBC 4.2  4.0 - 10.5 K/uL   RBC 4.22  4.22 - 5.81 MIL/uL   Hemoglobin 14.1  13.0 - 17.0 g/dL   HCT 16.1  09.6 - 04.5 %   MCV 95.0  78.0 - 100.0 fL   MCH 33.4  26.0 - 34.0 pg   MCHC 35.2  30.0 - 36.0 g/dL   RDW 40.9  81.1 - 91.4 %   Platelets 55 (*) 150 - 400 K/uL   Comment: CONSISTENT WITH PREVIOUS RESULT  GLUCOSE, CAPILLARY     Status: None   Collection Time    10/22/12  7:37 AM      Result Value Range   Glucose-Capillary 96  70 - 99 mg/dL    Comment 1 Notify RN      Dg Chest 2 View  10/21/2012   *RADIOLOGY REPORT*  Clinical Data: Loss of consciousness  CHEST - 2 VIEW  Comparison: Prior radiograph from 01/29/2012  Findings: Cardiac and mediastinal silhouettes are stable in size and contour, and remain within normal limits.  The lungs are normally inflated.  No airspace consolidation, pleural effusion, or pulmonary edema is identified.  There is no pneumothorax.  Bony thorax is intact.  Metallic coils overlying the upper mid abdomen.  IMPRESSION: No acute cardiopulmonary process.   Original Report Authenticated By: Rise Mu, M.D.   Mr Laqueta Jean NW Contrast  10/21/2012   *RADIOLOGY REPORT*  Clinical Data: Seizures.  Cirrhosis.  MRI HEAD WITHOUT AND WITH CONTRAST  Technique:  Multiplanar, multiecho pulse sequences of the brain and surrounding structures were obtained according to standard protocol without and with intravenous contrast  Contrast: 20mL MULTIHANCE GADOBENATE DIMEGLUMINE 529 MG/ML IV SOLN  Comparison: Head CT 09/21/2004  Findings: Diffusion imaging does not show any acute or subacute infarction.  The brainstem and cerebellum are normal.  The cerebral hemispheres show mild generalized atrophy with minimal small vessel change of the hemispheric deep white matter.  T1 hyperintensity within the thalami and basal ganglia is associated with chronic liver disease.  No mass lesion, hemorrhage, hydrocephalus or extra- axial collection.  No pituitary mass.  No inflammatory sinus disease.  After contrast administration, no abnormal enhancement occurs.  IMPRESSION: No acute finding.  No specific cause of seizure.  T1 hyperintensity affecting the thalami and basal ganglia, a finding seen and chronic liver disease.   Original Report Authenticated By: Paulina Fusi, M.D.   US Abdomen Limited  10/21/2012     *RADIOLOGY REPORT*  Limited abdominal ultrasound  History:  Abdominal distension  Findings:  There is no demonstrable ascites.  There are  multiple loops of peristaltic bowel in the abdomen.  Conclusion:  No demonstrable ascites.   Original Report Authenticated By: Bretta Bang, M.D.   Acute Abdominal Series  10/21/2012   *RADIOLOGY REPORT*  Clinical Data: Syncope x3 yesterday.  Vomiting and diarrhea and abdominal pain.  ACUTE ABDOMEN SERIES (ABDOMEN 2 VIEW & CHEST 1 VIEW)  Comparison: Chest radiograph, 10/21/2012 obtained earlier today.  Findings: There is a normal bowel gas pattern.  There is no obstruction or free air.  The stent lies in the right upper quadrant system with a TIPS procedure.  There is been a cholecystectomy.  A wire coil lies in the left upper mid abdomen superimposed on the left upper endplate of L1.  The soft tissues are otherwise unremarkable.  The lungs are clear.  The heart, mediastinum and hila are unremarkable.  IMPRESSION: No acute findings.  No evidence of obstruction or free air.   Original Report Authenticated By: Amie Portland, M.D.     Assessment/Plan: 47 y/o with recurrent, paroxysmal, stereotype episodes reminiscent of vasovagal syncope/convulsive syncope. Although can not entirely exclude the possibility of atonic seizures or partial seizures with bradyarrhythmias causing secondary syncope, this seems to be much less likely. Will wait for EEG. Tilt table test as outpatient, if EEG normal. Will follow up.    Wyatt Portela, MD [facility] [phone]  10/22/2012, 11:20 AM

## 2012-10-22 NOTE — Progress Notes (Signed)
Clinical Child psychotherapist (CSW) was contacted by Charity fundraiser regarding a Medicaid application for patient. Per RN patient knows a little english but mostly communicates in spanish. RN agreed to provide interpreting services. CSW dropped off Medicaid application and gave the address and phone number of [facility] Department of Social Services. CSW will contact financial counselor regarding patient's interest in applying for Medicaid.    Jetta Lout, LCSWA Weekend Social Worker  [phone]

## 2012-10-23 ENCOUNTER — Inpatient Hospital Stay (HOSPITAL_COMMUNITY): Payer: Self-pay

## 2012-10-23 LAB — GLUCOSE, CAPILLARY

## 2012-10-23 MED ORDER — LOPERAMIDE HCL 2 MG PO TABS: 2.0000 mg | ORAL_TABLET | Freq: Four times a day (QID) | ORAL | Status: DC | PRN

## 2012-10-23 MED ORDER — SPIRONOLACTONE 50 MG PO TABS: 50.0000 mg | ORAL_TABLET | Freq: Every day | ORAL | Status: DC

## 2012-10-23 MED ORDER — OMEPRAZOLE 40 MG PO CPDR: 40.0000 mg | DELAYED_RELEASE_CAPSULE | Freq: Every day | ORAL | Status: DC

## 2012-10-23 NOTE — Progress Notes (Signed)
EEG Completed; Results Pending  

## 2012-10-23 NOTE — Progress Notes (Signed)
NEURO HOSPITALIST PROGRESS NOTE   SUBJECTIVE:                                                                                                                        No neurological complains. EEG normal. MRI brain unremarkable. OBJECTIVE:                                                                                                                           Vital signs in last 24 hours: Temp:  [97.7 F (36.5 C)-98.2 F (36.8 C)] 98.2 F (36.8 C) (08/18 0500) Pulse Rate:  [74-88] 76 (08/18 0500) Resp:  [16-20] 16 (08/18 0500) BP: (120-140)/(68-75) 120/70 mmHg (08/18 0500) SpO2:  [97 %-98 %] 98 % (08/18 0500)  Intake/Output from previous day: 08/17 0701 - 08/18 0700 In: 1200 [P.O.:1200] Out: -  Intake/Output this shift: Total I/O In: 360 [P.O.:360] Out: -  Nutritional status: Cardiac  Past Medical History  Diagnosis Date  . Shortness of breath   . Sleep apnea   . Diabetes mellitus without complication   . GERD (gastroesophageal reflux disease)   . H/O hiatal hernia   . Seizures   . Headache(784.0)   . No pertinent past medical history       Neurologic Exam:  Mental Status: Alert, oriented, thought content appropriate.  Speech fluent without evidence of aphasia.  Able to follow 3 step commands without difficulty. Cranial Nerves: II: Discs flat bilaterally; Visual fields grossly normal, pupils equal, round, reactive to light and accommodation III,IV, VI: ptosis not present, extra-ocular motions intact bilaterally V,VII: smile symmetric, facial light touch sensation normal bilaterally VIII: hearing normal bilaterally IX,X: gag reflex present XI: bilateral shoulder shrug XII: midline tongue extension Motor: Right : Upper extremity   5/5    Left:     Upper extremity   5/5  Lower extremity   5/5     Lower extremity   5/5 Tone and bulk:normal tone throughout; no atrophy noted Sensory: Pinprick and light touch intact throughout,  bilaterally Deep Tendon Reflexes:  1+ all over  Plantars: Right: downgoing   Left: downgoing Cerebellar: normal finger-to-nose,  normal heel-to-shin test Gait: No ataxia. CV: pulses palpable throughout    Lab Results: No results found  for this basename: cbc, bmp, coags, chol, tri, ldl, hga1c   Lipid Panel No results found for this basename: CHOL, TRIG, HDL, CHOLHDL, VLDL, LDLCALC,  in the last 72 hours  Studies/Results: Mr Laqueta Jean Wo Contrast  10/21/2012   *RADIOLOGY REPORT*  Clinical Data: Seizures.  Cirrhosis.  MRI HEAD WITHOUT AND WITH CONTRAST  Technique:  Multiplanar, multiecho pulse sequences of the brain and surrounding structures were obtained according to standard protocol without and with intravenous contrast  Contrast: 20mL MULTIHANCE GADOBENATE DIMEGLUMINE 529 MG/ML IV SOLN  Comparison: Head CT 09/21/2004  Findings: Diffusion imaging does not show any acute or subacute infarction.  The brainstem and cerebellum are normal.  The cerebral hemispheres show mild generalized atrophy with minimal small vessel change of the hemispheric deep white matter.  T1 hyperintensity within the thalami and basal ganglia is associated with chronic liver disease.  No mass lesion, hemorrhage, hydrocephalus or extra- axial collection.  No pituitary mass.  No inflammatory sinus disease.  After contrast administration, no abnormal enhancement occurs.  IMPRESSION: No acute finding.  No specific cause of seizure.  T1 hyperintensity affecting the thalami and basal ganglia, a finding seen and chronic liver disease.   Original Report Authenticated By: Paulina Fusi, M.D.   US Abdomen Limited  10/21/2012     *RADIOLOGY REPORT*  Limited abdominal ultrasound  History:  Abdominal distension  Findings:  There is no demonstrable ascites.  There are multiple loops of peristaltic bowel in the abdomen.  Conclusion:  No demonstrable ascites.   Original Report Authenticated By: Bretta Bang, M.D.    MEDICATIONS                                                                                                                        I have reviewed the patient's current medications.  ASSESSMENT/PLAN:                                                                                                           Routine scalp EEG is normal. No further neurological intervention needed at this time. I think he most likely has syncope with 2 prior episodes of convulsive syncope and there is not need for AED. Will recommend TILT TABLE TEST as outpatient. Will sign off. Please call neurology with any questions or concerns.  Wyatt Portela, MD [facility].       Wyatt Portela, MD [facility] [phone]  10/23/2012, 11:20 AM

## 2012-10-23 NOTE — Discharge Summary (Addendum)
PATIENT DETAILS Name: [name] [name] Age: 47 y.o. Sex: male Date of Birth: 07/07/1965 MRN: [id number]. Admit Date: 10/21/2012 Admitting Physician: Ron Parker, MD PCP:No primary provider on file.  Recommendations for Outpatient Follow-up:  1. Started on Aldactone this admission-please monitor electrolytes periodically 2. If continues to have syncopal episodes-please arrange for outpatient tilt table test and perhaps cardiology evaluation.  PRIMARY DISCHARGE DIAGNOSIS:  Principal Problem:   Syncopal episodes Active Problems:   Alcohol abuse   Alcoholic cirrhosis   Thrombocytopenia, secondary   History of GI bleed   Abdominal pain      PAST MEDICAL HISTORY: Past Medical History  Diagnosis Date  . Shortness of breath   . Sleep apnea   . Diabetes mellitus without complication   . GERD (gastroesophageal reflux disease)   . H/O hiatal hernia   . Seizures   . Headache(784.0)   . No pertinent past medical history     DISCHARGE MEDICATIONS:   Medication List         loperamide 2 MG tablet  Commonly known as:  IMODIUM A-D  Take 1 tablet (2 mg total) by mouth 4 (four) times daily as needed for diarrhea or loose stools.     omeprazole 40 MG capsule  Commonly known as:  PRILOSEC  Take 1 capsule (40 mg total) by mouth daily.     spironolactone 50 MG tablet  Commonly known as:  ALDACTONE  Take 1 tablet (50 mg total) by mouth daily.        ALLERGIES:  No Known Allergies  BRIEF HPI:  See H&P, Labs, Consult and Test reports for all details in brief, patient was admitted for presyncopal episodes. Patient has a past medical history of EtOH use, seizures and liver cirrhosis.  CONSULTATIONS:   neurology  PERTINENT RADIOLOGIC STUDIES: Dg Chest 2 View  10/21/2012   *RADIOLOGY REPORT*  Clinical Data: Loss of consciousness  CHEST - 2 VIEW  Comparison: Prior radiograph from 01/29/2012  Findings: Cardiac and mediastinal silhouettes are stable in size and contour, and  remain within normal limits.  The lungs are normally inflated.  No airspace consolidation, pleural effusion, or pulmonary edema is identified.  There is no pneumothorax.  Bony thorax is intact.  Metallic coils overlying the upper mid abdomen.  IMPRESSION: No acute cardiopulmonary process.   Original Report Authenticated By: Rise Mu, M.D.   Mr Laqueta Jean NW Contrast  10/21/2012   *RADIOLOGY REPORT*  Clinical Data: Seizures.  Cirrhosis.  MRI HEAD WITHOUT AND WITH CONTRAST  Technique:  Multiplanar, multiecho pulse sequences of the brain and surrounding structures were obtained according to standard protocol without and with intravenous contrast  Contrast: 20mL MULTIHANCE GADOBENATE DIMEGLUMINE 529 MG/ML IV SOLN  Comparison: Head CT 09/21/2004  Findings: Diffusion imaging does not show any acute or subacute infarction.  The brainstem and cerebellum are normal.  The cerebral hemispheres show mild generalized atrophy with minimal small vessel change of the hemispheric deep white matter.  T1 hyperintensity within the thalami and basal ganglia is associated with chronic liver disease.  No mass lesion, hemorrhage, hydrocephalus or extra- axial collection.  No pituitary mass.  No inflammatory sinus disease.  After contrast administration, no abnormal enhancement occurs.  IMPRESSION: No acute finding.  No specific cause of seizure.  T1 hyperintensity affecting the thalami and basal ganglia, a finding seen and chronic liver disease.   Original Report Authenticated By: Paulina Fusi, M.D.   US Abdomen Limited  10/21/2012     *RADIOLOGY REPORT*  Limited abdominal ultrasound  History:  Abdominal distension  Findings:  There is no demonstrable ascites.  There are multiple loops of peristaltic bowel in the abdomen.  Conclusion:  No demonstrable ascites.   Original Report Authenticated By: Bretta Bang, M.D.   Acute Abdominal Series  10/21/2012   *RADIOLOGY REPORT*  Clinical Data: Syncope x3 yesterday.  Vomiting  and diarrhea and abdominal pain.  ACUTE ABDOMEN SERIES (ABDOMEN 2 VIEW & CHEST 1 VIEW)  Comparison: Chest radiograph, 10/21/2012 obtained earlier today.  Findings: There is a normal bowel gas pattern.  There is no obstruction or free air.  The stent lies in the right upper quadrant system with a TIPS procedure.  There is been a cholecystectomy.  A wire coil lies in the left upper mid abdomen superimposed on the left upper endplate of L1.  The soft tissues are otherwise unremarkable.  The lungs are clear.  The heart, mediastinum and hila are unremarkable.  IMPRESSION: No acute findings.  No evidence of obstruction or free air.   Original Report Authenticated By: Amie Portland, M.D.     PERTINENT LAB RESULTS: CBC:  Recent Labs  10/21/12 0923 10/22/12 0455  WBC 3.8* 4.2  HGB 13.0 14.1  HCT 35.6* 40.1  PLT 47* 55*   CMET CMP     Component Value Date/Time   NA 138 10/22/2012 0455   K 3.8 10/22/2012 0455   CL 108 10/22/2012 0455   CO2 22 10/22/2012 0455   GLUCOSE 110* 10/22/2012 0455   BUN 5* 10/22/2012 0455   CREATININE 0.51 10/22/2012 0455   CALCIUM 8.6 10/22/2012 0455   PROT 5.4* 10/22/2012 0455   ALBUMIN 2.6* 10/22/2012 0455   AST 28 10/22/2012 0455   ALT 22 10/22/2012 0455   ALKPHOS 76 10/22/2012 0455   BILITOT 2.4* 10/22/2012 0455   GFRNONAA >90 10/22/2012 0455   GFRAA >90 10/22/2012 0455    GFR Estimated Creatinine Clearance: 141 ml/min (by C-G formula based on Cr of 0.51).  Recent Labs  10/21/12 0923  LIPASE 54    Recent Labs  10/21/12 0923 10/21/12 1335 10/21/12 2020  TROPONINI <0.30 <0.30 <0.30   No components found with this basename: POCBNP,  No results found for this basename: DDIMER,  in the last 72 hours  Recent Labs  10/21/12 0923  HGBA1C 5.9*   No results found for this basename: CHOL, HDL, LDLCALC, TRIG, CHOLHDL, LDLDIRECT,  in the last 72 hours No results found for this basename: TSH, T4TOTAL, FREET3, T3FREE, THYROIDAB,  in the last 72 hours No results  found for this basename: VITAMINB12, FOLATE, FERRITIN, TIBC, IRON, RETICCTPCT,  in the last 72 hours Coags:  Recent Labs  10/21/12 0923  INR 1.50*   Microbiology: Recent Results (from the past 240 hour(s))  CLOSTRIDIUM DIFFICILE BY PCR     Status: None   Collection Time    10/22/12 10:42 AM      Result Value Range Status   C difficile by pcr NEGATIVE  NEGATIVE Final     BRIEF HOSPITAL COURSE:   Principal Problem:   Syncopal episodes - Not sure what the etiology is, was seen by neurology, and they suspect neurocardiogenic mediated syncope. Patient was admitted to telemetry, a 2-D echocardiogram was obtained which did not show any significant abnormalities. Telemetry readings did not show any arrhythmias. Patient also underwent EEG which did not show any she is to like activity, MRI of the brain did not show any major abnormalities. - At this time he has  met maximum benefit from his inpatient hospital stay, further workup can be done in the outpatient setting if syncope does recur- which includes a tilt table test and outpatient cardiology evaluation - At this time neurology does not think that the syncopal episodes off from seizures, no antiepileptic therapy is currently indicated.  Active Problems: Abdominal pain - She did have periumbilical abdominal pain on admission, however on exam his abdomen was very soft. A abdominal x-ray was negative, ultrasound of the abdomen did not show any ascites. He did not have any leukocytosis or ascites to suggest SBP. Lipase was also within normal limits. He was closely monitored, abdomen was examined daily, he was started on a regular diet which he has tolerated. On the day of discharge, his abdominal pain has significantly improved, and has require no further narcotics. At this time further workup can be pursued in the outpatient setting, if abdominal pain recurs.  Chest pain - This is very atypical, he claims that the abdominal pain shoots up to  his chest. He was monitored on telemetry, cardiac enzymes were cycled and these were negative, a 2-D echocardiogram did not show any major abnormalities. Ejection fraction was preserved. - He was maintained on a PPI, he will be prescribed a PPI on discharge.  Diarrhea - C. difficile PCR was negative, he was started on a probiotic. By the day of discharge he no longer had any diarrhea. He will be provided with Imodium to be used as needed.  Alcoholic cirrhosis  -stable  -leg edema much improved-after starting Aldactone  - He did have GI bleeding in the past, and is status post a TIPS procedure.  Thrombocytopenia, secondary  - Secondary to cirrhosis  - Monitor platelet count on discharge.  Marland Kitchen Hx of Alcohol abuse  - Claims that he has been not using any alcohol for the past 2 years. Alcohol level on presentation is less than 15.  - He was placed on prn Ativan, MVI/thiamine/folate. There were no signs of alcohol withdrawal during this admission.  TODAY-DAY OF DISCHARGE:  Subjective:   Joaopedro Eschbach today has no headache,no chest abdominal pain,no new weakness tingling or numbness, feels much better wants to go home today.   Objective:   Blood pressure 120/70, pulse 76, temperature 98.2 F (36.8 C), temperature source Oral, resp. rate 16, height 5\' 11"  (1.803 m), weight 107.911 kg (237 lb 14.4 oz), SpO2 98.00%.  Intake/Output Summary (Last 24 hours) at 10/23/12 1330 Last data filed at 10/23/12 1314  Gross per 24 hour  Intake   1200 ml  Output      0 ml  Net   1200 ml   Filed Weights   10/21/12 0606 10/21/12 0615  Weight: 107.865 kg (237 lb 12.8 oz) 107.911 kg (237 lb 14.4 oz)    Exam Awake Alert, Oriented *3, No new F.N deficits, Normal affect [place].AT,PERRAL Supple Neck,No JVD, No cervical lymphadenopathy appriciated.  Symmetrical Chest wall movement, Good air movement bilaterally, CTAB RRR,No Gallops,Rubs or new Murmurs, No Parasternal Heave +ve B.Sounds, Abd Soft, Non  tender, No organomegaly appriciated, No rebound -guarding or rigidity. No Cyanosis, Clubbing or edema, No new Rash or bruise  DISCHARGE CONDITION: Stable  DISPOSITION: Home  DISCHARGE INSTRUCTIONS:    Activity:  As tolerated   Diet recommendation: Low salt diet.  Discharge Orders   Future Appointments Provider Department Dept Phone   10/23/2012 2:30 PM [facility] EEG [phone]   11/02/2012 2:30 PM [facility] Covering Provider [place]  [facility] [phone]   Future Orders Complete By Expires   Call MD for:  persistant dizziness or light-headedness  As directed    Diet - low sodium heart healthy  As directed    Increase activity slowly  As directed      Follow-up Information   Follow up with [facility]     On 11/02/2012. (f/u appointment made for 11/02/12 at 2:30 -call [phone] to reschedule if unable to keep this appointment)    Contact information:   [address] [place] [address]     Total Time spent on discharge equals 45 minutes.  SignedJeoffrey Massed 10/23/2012 1:30 PM

## 2012-10-23 NOTE — Procedures (Signed)
EEG report.  Brief clinical history:  48 years old male with recurrent syncopal episodes. Differential diagnosis is syncope versus seizures. Technique: this is a 17 channel routine scalp EEG performed at the bedside with bipolar and monopolar montages arranged in accordance to the international 10/20 system of electrode placement. One channel was dedicated to EKG recording.  The study was performed during wakefulness, drowsiness, and stage 2 sleep. Intermittent photic stimulation was the sole activating procedure employed during the test.  Description:In the wakeful state, the best background consisted of a medium amplitude, posterior dominant, well sustained, symmetric and reactive 9-10 Hz rhythm. Drowsiness demonstrated dropout of the alpha rhythm. Stage 2 sleep showed symmetric and synchronous sleep spindles without intermixed epileptiform discharges. Intermittent photic stimulation did not induce a driving response.  No focal or generalized epileptiform discharges noted.  No slowing seen.  EKG showed sinus rhythm.  Impression: this is a normal awake and asleep EEG. Please, be aware that a normal EEG does not exclude the possibility of epilepsy.  Clinical correlation is advised.  Wyatt Portela, MD

## 2012-10-31 ENCOUNTER — Encounter (HOSPITAL_COMMUNITY): Payer: Self-pay | Admitting: Emergency Medicine

## 2012-10-31 ENCOUNTER — Emergency Department (HOSPITAL_COMMUNITY)
Admission: EM | Admit: 2012-10-31 | Discharge: 2012-10-31 | Disposition: A | Discharge status: Home / Self Care | Payer: Self-pay | Attending: Emergency Medicine | Admitting: Emergency Medicine

## 2012-10-31 ENCOUNTER — Emergency Department (HOSPITAL_COMMUNITY): Payer: Self-pay

## 2012-10-31 DIAGNOSIS — Z8719 Personal history of other diseases of the digestive system: Secondary | ICD-10-CM | POA: Insufficient documentation

## 2012-10-31 DIAGNOSIS — M79609 Pain in unspecified limb: Secondary | ICD-10-CM

## 2012-10-31 DIAGNOSIS — R609 Edema, unspecified: Secondary | ICD-10-CM

## 2012-10-31 DIAGNOSIS — R1013 Epigastric pain: Secondary | ICD-10-CM | POA: Insufficient documentation

## 2012-10-31 DIAGNOSIS — E119 Type 2 diabetes mellitus without complications: Secondary | ICD-10-CM

## 2012-10-31 DIAGNOSIS — Z87891 Personal history of nicotine dependence: Secondary | ICD-10-CM | POA: Insufficient documentation

## 2012-10-31 DIAGNOSIS — R42 Dizziness and giddiness: Secondary | ICD-10-CM | POA: Insufficient documentation

## 2012-10-31 DIAGNOSIS — R109 Unspecified abdominal pain: Secondary | ICD-10-CM

## 2012-10-31 DIAGNOSIS — R079 Chest pain, unspecified: Secondary | ICD-10-CM | POA: Insufficient documentation

## 2012-10-31 DIAGNOSIS — Z79899 Other long term (current) drug therapy: Secondary | ICD-10-CM | POA: Insufficient documentation

## 2012-10-31 DIAGNOSIS — R600 Localized edema: Secondary | ICD-10-CM

## 2012-10-31 DIAGNOSIS — M25559 Pain in unspecified hip: Secondary | ICD-10-CM | POA: Insufficient documentation

## 2012-10-31 DIAGNOSIS — Z8669 Personal history of other diseases of the nervous system and sense organs: Secondary | ICD-10-CM | POA: Insufficient documentation

## 2012-10-31 DIAGNOSIS — M7989 Other specified soft tissue disorders: Secondary | ICD-10-CM | POA: Insufficient documentation

## 2012-10-31 DIAGNOSIS — K7030 Alcoholic cirrhosis of liver without ascites: Secondary | ICD-10-CM

## 2012-10-31 DIAGNOSIS — D6959 Other secondary thrombocytopenia: Secondary | ICD-10-CM

## 2012-10-31 LAB — URINALYSIS, ROUTINE W REFLEX MICROSCOPIC
Bilirubin Urine: NEGATIVE
Hgb urine dipstick: NEGATIVE
Nitrite: NEGATIVE
Specific Gravity, Urine: 1.024 (ref 1.005–1.030)
Urobilinogen, UA: 1 mg/dL (ref 0.0–1.0)
pH: 6.5 (ref 5.0–8.0)

## 2012-10-31 LAB — CBC WITH DIFFERENTIAL/PLATELET
Basophils Absolute: 0 10*3/uL (ref 0.0–0.1)
Basophils Relative: 1 % (ref 0–1)
Eosinophils Absolute: 0.3 10*3/uL (ref 0.0–0.7)
Eosinophils Relative: 7 % — ABNORMAL HIGH (ref 0–5)
HCT: 36.1 % — ABNORMAL LOW (ref 39.0–52.0)
MCH: 33.8 pg (ref 26.0–34.0)
MCHC: 35.5 g/dL (ref 30.0–36.0)
MCV: 95.3 fL (ref 78.0–100.0)
Monocytes Absolute: 0.4 10*3/uL (ref 0.1–1.0)
Platelets: 53 10*3/uL — ABNORMAL LOW (ref 150–400)
RDW: 14.4 % (ref 11.5–15.5)
WBC: 3.6 10*3/uL — ABNORMAL LOW (ref 4.0–10.5)

## 2012-10-31 LAB — GLUCOSE, CAPILLARY: Glucose-Capillary: 191 mg/dL — ABNORMAL HIGH (ref 70–99)

## 2012-10-31 LAB — COMPREHENSIVE METABOLIC PANEL
ALT: 26 U/L (ref 0–53)
AST: 34 U/L (ref 0–37)
Calcium: 8.2 mg/dL — ABNORMAL LOW (ref 8.4–10.5)
Creatinine, Ser: 0.58 mg/dL (ref 0.50–1.35)
GFR calc non Af Amer: >90 mL/min (ref >90)
Sodium: 137 mEq/L (ref 135–145)
Total Protein: 5.7 g/dL — ABNORMAL LOW (ref 6.0–8.3)

## 2012-10-31 LAB — PRO B NATRIURETIC PEPTIDE: Pro B Natriuretic peptide (BNP): 74.9 pg/mL (ref 0–125)

## 2012-10-31 LAB — URINE MICROSCOPIC-ADD ON

## 2012-10-31 MED ORDER — SODIUM CHLORIDE 0.9 % IV BOLUS (SEPSIS)
1000.0000 mL | Freq: Once | INTRAVENOUS | Status: AC
  Administered 2012-10-31: 1000 mL via INTRAVENOUS

## 2012-10-31 MED ORDER — ONDANSETRON HCL 4 MG/2ML IJ SOLN
4.0000 mg | Freq: Once | INTRAMUSCULAR | Status: AC
  Administered 2012-10-31: 4 mg via INTRAVENOUS
  Filled 2012-10-31: qty 2

## 2012-10-31 MED ORDER — PROMETHAZINE HCL 25 MG PO TABS: 25.0000 mg | ORAL_TABLET | Freq: Four times a day (QID) | ORAL | Status: DC | PRN

## 2012-10-31 NOTE — Progress Notes (Signed)
VASCULAR LAB PRELIMINARY  PRELIMINARY  PRELIMINARY  PRELIMINARY  Bilateral lower extremity venous duplex  completed.    Preliminary report:  Bilateral:  No evidence of DVT, superficial thrombosis, or Baker's Cyst.   [name] [name], RVT 10/31/2012, 7:10 PM

## 2012-10-31 NOTE — ED Notes (Signed)
Pt c/o left leg pain and swelling and sts some N/V and dizziness; pt recently in hospital; pt sts thinks spironolactone not working

## 2012-10-31 NOTE — ED Provider Notes (Signed)
CSN: [id number]     Arrival date & time 10/31/12  1314 History   First MD Initiated Contact with Patient 10/31/12 1631     Chief Complaint  Patient presents with  . Leg Pain  . Emesis   (Consider location/radiation/quality/duration/timing/severity/associated sxs/prior Treatment) The history is provided by the patient and medical records. No language interpreter was used.    [name] [name] is a 47 y.o. male  with a hx of DM, GERD, seizures, liver cirrhosis presents to the Emergency Department complaining of gradual, persistent, progressively worsening leg swelling with associated L hip pain, nausea, emesis and dizziness onset 4 days ago. Patient reports that his left hip pain is worse when he is walking and better when he is laying down. Nothing seems to make his abdominal pain better or worse. He reports that the abdominal pain is located in the epigastrium and radiates up into his chest. He reports he's been taking his PPI as directed.  Pt denies fever, chills, headache, neck pain. Patient is scheduled for followup appointment at the [facility] in 2 days.   Past Medical History  Diagnosis Date  . Shortness of breath   . Sleep apnea   . Diabetes mellitus without complication   . GERD (gastroesophageal reflux disease)   . H/O hiatal hernia   . Seizures   . Headache(784.0)   . No pertinent past medical history    Past Surgical History  Procedure Laterality Date  . Appendectomy    . No past surgeries    . Radiology with anesthesia  01/30/2012    Procedure: RADIOLOGY WITH ANESTHESIA;  Surgeon: Casimiro Needle T. Miles Costain, MD;  Location: [facility] OR;  Service: Radiology;  Laterality: N/A;   History reviewed. No pertinent family history. History  Substance Use Topics  . Smoking status: Former Smoker -- 0.25 packs/day for 10 years    Types: Cigarettes    Quit date: 09/30/2007  . Smokeless tobacco: Never Used  . Alcohol Use: No     Comment: quit a year ago    Review of Systems   Constitutional: Negative for fever, diaphoresis, appetite change, fatigue and unexpected weight change.  HENT: Negative for mouth sores and neck stiffness.   Eyes: Negative for visual disturbance.  Respiratory: Negative for cough, chest tightness, shortness of breath and wheezing.   Cardiovascular: Positive for chest pain and leg swelling.  Gastrointestinal: Positive for nausea, vomiting and abdominal pain. Negative for diarrhea and constipation.  Endocrine: Negative for polydipsia, polyphagia and polyuria.  Genitourinary: Negative for dysuria, urgency, frequency and hematuria.  Musculoskeletal: Negative for back pain.  Skin: Negative for rash.  Allergic/Immunologic: Negative for immunocompromised state.  Neurological: Negative for syncope, light-headedness and headaches.  Hematological: Does not bruise/bleed easily.  Psychiatric/Behavioral: Negative for sleep disturbance. The patient is not nervous/anxious.     Allergies  Review of patient's allergies indicates no known allergies.  Home Medications   Current Outpatient Rx  Name  Route  Sig  Dispense  Refill  . spironolactone (ALDACTONE) 50 MG tablet   Oral   Take 1 tablet (50 mg total) by mouth daily.   30 tablet   0   . promethazine (PHENERGAN) 25 MG tablet   Oral   Take 1 tablet (25 mg total) by mouth every 6 (six) hours as needed for nausea.   12 tablet   0    BP 136/70  Pulse 70  Temp(Src) 98.2 F (36.8 C) (Oral)  Resp 18  SpO2 97% Physical Exam  Nursing note and vitals reviewed. Constitutional: He is oriented to person, place, and time. He appears well-developed and well-nourished. No distress.  Awake, alert, nontoxic appearance  HENT:  Head: Normocephalic and atraumatic.  Nose: Nose normal.  Mouth/Throat: Uvula is midline, oropharynx is clear and moist and mucous membranes are normal. Mucous membranes are not dry. No oropharyngeal exudate, posterior oropharyngeal edema, posterior oropharyngeal erythema or  tonsillar abscesses.  Eyes: Conjunctivae and EOM are normal. Pupils are equal, round, and reactive to light. No scleral icterus.  Neck: Normal range of motion. Neck supple.  Cardiovascular: Normal rate, regular rhythm, normal heart sounds and intact distal pulses.   No murmur heard. Pulmonary/Chest: Effort normal and breath sounds normal. No respiratory distress. He has no wheezes.  Abdominal: Soft. Bowel sounds are normal. He exhibits no distension and no mass. There is tenderness. There is no rebound and no guarding.  Mild epigastric tenderness  Musculoskeletal: Normal range of motion. He exhibits edema (bilateral). He exhibits no tenderness.  Lymphadenopathy:    He has no cervical adenopathy.  Neurological: He is alert and oriented to person, place, and time. No cranial nerve deficit. He exhibits normal muscle tone. Coordination normal.  Speech is clear and goal oriented Moves extremities without ataxia  Skin: Skin is warm and dry. No rash noted. He is not diaphoretic. No erythema.  Psychiatric: He has a normal mood and affect. His behavior is normal.    ED Course  Procedures (including critical care time) Labs Review Labs Reviewed  CBC WITH DIFFERENTIAL - Abnormal; Notable for the following:    WBC 3.6 (*)    RBC 3.79 (*)    Hemoglobin 12.8 (*)    HCT 36.1 (*)    Platelets 53 (*)    Eosinophils Relative 7 (*)    All other components within normal limits  COMPREHENSIVE METABOLIC PANEL - Abnormal; Notable for the following:    Glucose, Bld 307 (*)    Calcium 8.2 (*)    Total Protein 5.7 (*)    Albumin 2.6 (*)    Total Bilirubin 1.5 (*)    All other components within normal limits  URINALYSIS, ROUTINE W REFLEX MICROSCOPIC - Abnormal; Notable for the following:    Glucose, UA >1000 (*)    All other components within normal limits  GLUCOSE, CAPILLARY - Abnormal; Notable for the following:    Glucose-Capillary 191 (*)    All other components within normal limits  PRO B  NATRIURETIC PEPTIDE  URINE MICROSCOPIC-ADD ON   ECG:  Date: 10/31/2012  Rate: 65  Rhythm: normal sinus rhythm  QRS Axis: normal  Intervals: normal  ST/T Wave abnormalities: nonspecific T wave changes and peaked T waves  Conduction Disutrbances:none  Narrative Interpretation: Patient with borderline peaked T waves new from ECG on 10/21/2012.  Old EKG Reviewed: changes noted    Imaging Review Dg Hip Complete Left  10/31/2012   *RADIOLOGY REPORT*  Clinical Data: Left hip pain  LEFT HIP - COMPLETE 2+ VIEW  Comparison: None.  Findings: Three views of the left hip submitted.  No acute fracture or subluxation.  Hip joint space is preserved.  Bilateral hip joints are symmetrical in appearance.  IMPRESSION: No acute fracture or subluxation.   Original Report Authenticated By: Natasha Mead, M.D.    MDM   1. Peripheral edema   2. Alcoholic cirrhosis   3. Thrombocytopenia, secondary   4. DM type 2 (diabetes mellitus, type 2)   5. Abdominal pain      [name] Hua  [name] presents with increased swelling in his legs in spite of the spironolactone and abd pain and emesis in spite of the PPI.  Patient with epigastric pain that radiates into his chest. Review shows the patient was admitted to be clear after several syncopal episodes. At that time he had an extensive cardiac workup including serial troponins an echocardiogram which was all negative.  Patient's low platelets likely secondary to his cirrhosis and this is also likely the reason for his abdominal pain.   Will evaluate for DVT since patient has new swelling and left leg pain.  Will also obtain x-ray of left hip.  7:05 PM Patient negative for DVT on venous duplex. Hip x-ray without abnormalities.  I personally reviewed the imaging tests through PACS system.  I reviewed available ER/hospitalization records through the EMR.  Patient with low albumin potentially the reason for his leg swelling and common with liver cirrhosis.  BNP within normal  limits.    8:37 PM Patient in the legs without difficulty and is tolerating by mouth. No emesis in the department.  Patient is alert, nontoxic, nonseptic appearing. Patient's blood sugar decreased to 191 with only 0 fluid. He is afebrile, non-tachycardic.  Patient has a followup appointment with the component held in wellness center already scheduled for Thursday, 11/02/2012. I recommend that he continue to this appointment and he discussed his chronic abdominal pain, leg swelling with the doctor at that time.  I have also discussed reasons to return immediately to the ER.  Patient expresses understanding and agrees with plan.  Dr. Manus Gunning was consulted, evaluated this patient with me and agrees with the plan.          Dahlia Client Jersee Winiarski, PA-C 10/31/12 2038

## 2012-11-01 NOTE — ED Provider Notes (Signed)
Medical screening examination/treatment/procedure(s) were conducted as a shared visit with non-physician practitioner(s) and myself.  I personally evaluated the patient during the encounter  4 day history of atraumatic L leg pain.  No fever.  No focal weakness.  FROM L hip without pain.  Intact distal pulses. Recent admissino for syncope with negative workup. Hyperglycemia without DKA. Stable thrombocytopenia.  Glynn Octave, MD 11/01/12 [phone]

## 2012-11-02 ENCOUNTER — Ambulatory Visit: Payer: Self-pay | Attending: Internal Medicine | Admitting: Internal Medicine

## 2012-11-02 VITALS — BP 130/76 | HR 71 | Temp 97.7°F | Resp 15 | Wt 239.0 lb

## 2012-11-02 DIAGNOSIS — K7030 Alcoholic cirrhosis of liver without ascites: Secondary | ICD-10-CM | POA: Insufficient documentation

## 2012-11-02 DIAGNOSIS — M25569 Pain in unspecified knee: Secondary | ICD-10-CM | POA: Insufficient documentation

## 2012-11-02 DIAGNOSIS — M25562 Pain in left knee: Secondary | ICD-10-CM

## 2012-11-02 DIAGNOSIS — E1310 Other specified diabetes mellitus with ketoacidosis without coma: Secondary | ICD-10-CM | POA: Insufficient documentation

## 2012-11-02 DIAGNOSIS — E1110 Type 2 diabetes mellitus with ketoacidosis without coma: Secondary | ICD-10-CM

## 2012-11-02 MED ORDER — METFORMIN HCL 500 MG PO TABS: 500.0000 mg | ORAL_TABLET | Freq: Two times a day (BID) | ORAL | Status: DC

## 2012-11-02 NOTE — Patient Instructions (Signed)
Diabetes y actividad fsica (Diabetes and Exercise) La actividad fsica regular es muy importante y ayuda a:   Controlar el nivel de glucosa en sangre (azcar).  Disminuir la presin arterial.  Controlar el colesterol en sangre (colesterol y triglicridos).  Mejorar el estado de salud general. BENEFICIOS DE LA ACTIVIDAD FSICA  Mejora el buen estado fsico.  Mejora la flexibilidad.  Aumenta la resistencia.  Aumenta la densidad sea.  Favorece el control del peso.  Aumenta la fuerza muscular.  Disminuye la grasa corporal.  Mejora la utilizacin de la insulina por parte del organismo.  Aumenta la sensibilidad a la insulina.  Reduce las necesidades de insulina.  Har que se sienta mejor.  Reduce el estrs y las tensiones. Las personas diabticas que incorporan la actividad fsica a su estilo de vida obtienen beneficios adicionales.  Prdida de peso.  Reduccin del apetito.  Mejora la utilizacin de la glucosa por parte del organismo.  Disminuye los factores de riesgo para las enfermedades cardacas.  Disminuye el colesterol y los triglicridos.  Eleva el nivel de colesterol bueno (lipoproteinas de alta densidad HDL).  Disminuye el nivel de azcar en la sangre.  Disminuye la presin arterial. DIABETES TIPO I Y ACTIVIDAD FSICA  La actividad fsica disminuir el nivel de glucosa en sangre.  Si el nivel de glucosa en sangre es de ms de 240 mg/dl, controle las cetonas en la orina. Si hay cetonas, no realice actividad fisica.  El sitio de inyeccin de la insulina puede requerir un ajuste cuando se realiza actividad fsica. Evite inyectarse insulina en las zonas del cuerpo que ejercitar. Por ejemplo, evite inyectarse insulina en:  Los brazos, si juega al tenis.  Las piernas, si corre. Para obtener ms informacion, consulte a su mdico.  Lleve un registro de:  La ingesta de alimentos.  El tipo y cantidad de actividad fsica.  Los momentos esperables de  picos de accin de la insulina.  Los niveles de glucosa en sangre. Hgalo antes, durante y despus de realizar la actividad fsica. Verifique los registros junto con su mdico. Esto ser de utilidad para la confeccin de pautas para ajustar la ingesta de alimentos o las cantidades de insulina.  DIABETES TIPO 2 Y ACTIVIDAD FSICA  La actividad fsica regular ayuda a controlar el nivel de glucosa en sangre.  La actividad fsica es importante porque:  Aumenta la sensibilidad del organismo a la insulina.  Mejora el control del nivel de glucosa en sangre.  Reduce el riesgo de enfermedades cardiacas. Disminuye el colesterol srico y los triglicridos. Disminuye la presin arterial.  Las personas que reciben insulina o agentes hipoglucemiantes por va oral deben controlar la aparicin de signos de hipoglucemia (mareos, temblores, sudoracin, escalofros y confusin).  Durante la actividad fsica se pierde agua corporal. Esta prdida de lquidos debe reponerse. De este modo se evita la prdida de lquidos corporales (deshidratacin) y el golpe de calor. Comente con su mdico antes de comenzar un programa de actividad fsica para verificar que sea seguro para usted. Recuerde, cualquier actividad es mejor que ninguna.  Document Released: 03/14/2007 Document Revised: 05/17/2011 [employer] Patient Information 2014 [employer].  

## 2012-11-02 NOTE — Progress Notes (Signed)
Patient ID: [name] [name], male   DOB: [birth year], 47 y.o.   MRN: [id number]  CC: To establish care  HPI: [name] [name] is a 47 years old man here to establish medical care. He has history of gastroesophageal reflux disease, liver cirrhosis, obstructive sleep apnea, seizure, and a recent diagnosis of diabetes not yet on medication. Has no significant complaints today, except for leg swelling which has been present for about 2 week for which she went to the hospital and was told that because of his liver. He admits to drinking lots of soda, and lately has had polyphagia especially for bread. He wakes up at night to eat bread, and has been drinking a lot of water. While in the hospital, blood sugar ranges from 99-350, and glucose in urine was more than 1000. He reports no blurry vision, denies chest pain, no shortness of breath, denies abdominal swelling. He quits drinking alcohol.  No Known Allergies Past Medical History  Diagnosis Date  . Shortness of breath   . Sleep apnea   . Diabetes mellitus without complication   . GERD (gastroesophageal reflux disease)   . H/O hiatal hernia   . Seizures   . Headache(784.0)   . No pertinent past medical history    Current Outpatient Prescriptions on File Prior to Visit  Medication Sig Dispense Refill  . promethazine (PHENERGAN) 25 MG tablet Take 1 tablet (25 mg total) by mouth every 6 (six) hours as needed for nausea.  12 tablet  0  . spironolactone (ALDACTONE) 50 MG tablet Take 1 tablet (50 mg total) by mouth daily.  30 tablet  0   No current facility-administered medications on file prior to visit.   History reviewed. No pertinent family history. History   Social History  . Marital Status: Single    Spouse Name: N/A    Number of Children: N/A  . Years of Education: N/A   Occupational History  . Not on file.   Social History Main Topics  . Smoking status: Former Smoker -- 0.25 packs/day for 10 years    Types: Cigarettes    Quit date:  09/30/2007  . Smokeless tobacco: Never Used  . Alcohol Use: No     Comment: quit a year ago  . Drug Use: No  . Sexual Activity: Not Currently   Other Topics Concern  . Not on file   Social History Narrative  . No narrative on file    Review of Systems: Constitutional: Negative for fever, chills, diaphoresis, activity change, appetite change and fatigue. HENT: Negative for ear pain, nosebleeds, congestion, facial swelling, rhinorrhea, neck pain, neck stiffness and ear discharge.  Eyes: Negative for pain, discharge, redness, itching and visual disturbance. Respiratory: Negative for cough, choking, chest tightness, shortness of breath, wheezing and stridor.  Cardiovascular: Negative for chest pain, palpitations and leg swelling. Gastrointestinal: Negative for abdominal distention. Genitourinary: Negative for dysuria, urgency, frequency, hematuria, flank pain, decreased urine volume, difficulty urinating and dyspareunia.  Musculoskeletal: Negative for back pain, joint swelling, arthralgias and gait problem. Neurological: Negative for dizziness, tremors, seizures, syncope, facial asymmetry, speech difficulty, weakness, light-headedness, numbness and headaches.  Hematological: Negative for adenopathy. Does not bruise/bleed easily. Psychiatric/Behavioral: Negative for hallucinations, behavioral problems, confusion, dysphoric mood, decreased concentration and agitation.    Objective:   Filed Vitals:   11/02/12 1503  BP: 130/76  Pulse: 71  Temp: 97.7 F (36.5 C)  Resp: 15    Physical Exam: Constitutional: Patient appears well-developed and well-nourished. No distress. HENT: Normocephalic, atraumatic,  External right and left ear normal. Oropharynx is clear and moist.  Eyes: Conjunctivae and EOM are normal. PERRLA, no scleral icterus. Neck: Normal ROM. Neck supple. No JVD. No tracheal deviation. No thyromegaly. CVS: RRR, S1/S2 +, no murmurs, no gallops, no carotid bruit.  Pulmonary:  Effort and breath sounds normal, no stridor, rhonchi, wheezes, rales.  Abdominal: Soft. BS +,  no distension, tenderness, rebound or guarding.  Musculoskeletal: Mild pitting pedal edema bilaterally  Lymphadenopathy: No lymphadenopathy noted, cervical, inguinal or axillary Neuro: Alert. Normal reflexes, muscle tone coordination. No cranial nerve deficit. Skin: Skin is warm and dry. No rash noted. Not diaphoretic. No erythema. No pallor. Psychiatric: Normal mood and affect. Behavior, judgment, thought content normal.  Lab Results  Component Value Date   WBC 3.6* 10/31/2012   HGB 12.8* 10/31/2012   HCT 36.1* 10/31/2012   MCV 95.3 10/31/2012   PLT 53* 10/31/2012   Lab Results  Component Value Date   CREATININE 0.58 10/31/2012   BUN 8 10/31/2012   NA 137 10/31/2012   K 4.0 10/31/2012   CL 107 10/31/2012   CO2 23 10/31/2012    Lab Results  Component Value Date   HGBA1C 5.9* 10/21/2012   Lipid Panel  No results found for this basename: chol, trig, hdl, cholhdl, vldl, ldlcalc       Assessment and plan:   Patient Active Problem List   Diagnosis Date Noted  . Liver cirrhosis, alcoholic 11/02/2012  . DM (diabetes mellitus) type 2, uncontrolled, with ketoacidosis 11/02/2012  . Pain in joint, lower leg 11/02/2012  . Syncopal episodes 10/21/2012  . Abdominal pain 10/21/2012  . Alcohol abuse 01/29/2012  . Alcoholic cirrhosis 01/29/2012  . Thrombocytopenia, secondary 01/29/2012  . DM type 2 (diabetes mellitus, type 2) 01/29/2012  . Esophageal varices in alcoholic cirrhosis 01/29/2012  . History of GI bleed 01/29/2012   Patient was a specific counseled about alcohol use in the setting of liver cirrhosis Continue spironolactone as prescribed  Start metformin 500 mg tablet by mouth twice a day for hyperglycemia and prediabetes Patient was counseled about diagnosis of leg swelling which is possibly due to hypoalbuminemia as a result of liver cirrhosis Patient was extensively counseled about  nutrition and exercise Patient was also counseled about medication compliance  [name] [name] was given clear instructions to go to ER or return to the clinic if symptoms don't improve, worsen or new problems develop.  [name] [name] verbalized understanding.  [name] [name] was told to call to get lab results if hasn't heard anything in the next week.        Jeanann Lewandowsky, MD [facility] [place], [place] [phone]   11/02/2012, 4:10 PM

## 2012-11-02 NOTE — Progress Notes (Signed)
Used the interpreter line Patient recently in the hospital Was seen for swelling in the groin and leg on left side Was told it was from his diabetes His A1c was 5.9 done 10/21/12

## 2012-11-16 ENCOUNTER — Ambulatory Visit: Payer: Self-pay | Attending: Internal Medicine | Admitting: Internal Medicine

## 2012-11-16 ENCOUNTER — Encounter: Payer: Self-pay | Admitting: Internal Medicine

## 2012-11-16 VITALS — BP 127/67 | HR 83 | Temp 98.0°F | Resp 17 | Wt 239.0 lb

## 2012-11-16 DIAGNOSIS — Z760 Encounter for issue of repeat prescription: Secondary | ICD-10-CM | POA: Insufficient documentation

## 2012-11-16 DIAGNOSIS — K219 Gastro-esophageal reflux disease without esophagitis: Secondary | ICD-10-CM | POA: Insufficient documentation

## 2012-11-16 DIAGNOSIS — F1020 Alcohol dependence, uncomplicated: Secondary | ICD-10-CM | POA: Insufficient documentation

## 2012-11-16 DIAGNOSIS — K7030 Alcoholic cirrhosis of liver without ascites: Secondary | ICD-10-CM | POA: Insufficient documentation

## 2012-11-16 DIAGNOSIS — Z87891 Personal history of nicotine dependence: Secondary | ICD-10-CM | POA: Insufficient documentation

## 2012-11-16 DIAGNOSIS — Z79899 Other long term (current) drug therapy: Secondary | ICD-10-CM | POA: Insufficient documentation

## 2012-11-16 DIAGNOSIS — E119 Type 2 diabetes mellitus without complications: Secondary | ICD-10-CM | POA: Insufficient documentation

## 2012-11-16 LAB — CBC WITH DIFFERENTIAL/PLATELET
Eosinophils Absolute: 0.3 10*3/uL (ref 0.0–0.7)
Eosinophils Relative: 7 % — ABNORMAL HIGH (ref 0–5)
HCT: 40.4 % (ref 39.0–52.0)
Hemoglobin: 14 g/dL (ref 13.0–17.0)
Lymphs Abs: 1 10*3/uL (ref 0.7–4.0)
MCH: 33.4 pg (ref 26.0–34.0)
MCHC: 34.7 g/dL (ref 30.0–36.0)
MCV: 96.4 fL (ref 78.0–100.0)
Monocytes Absolute: 0.4 10*3/uL (ref 0.1–1.0)
Monocytes Relative: 9 % (ref 3–12)
Neutrophils Relative %: 61 % (ref 43–77)
RBC: 4.19 MIL/uL — ABNORMAL LOW (ref 4.22–5.81)

## 2012-11-16 LAB — COMPREHENSIVE METABOLIC PANEL
Albumin: 3.2 g/dL — ABNORMAL LOW (ref 3.5–5.2)
BUN: 11 mg/dL (ref 6–23)
CO2: 20 mEq/L (ref 19–32)
Calcium: 8.9 mg/dL (ref 8.4–10.5)
Glucose, Bld: 178 mg/dL — ABNORMAL HIGH (ref 70–99)
Potassium: 3.8 mEq/L (ref 3.5–5.3)
Sodium: 137 mEq/L (ref 135–145)
Total Protein: 5.9 g/dL — ABNORMAL LOW (ref 6.0–8.3)

## 2012-11-16 LAB — LIPID PANEL
Cholesterol: 137 mg/dL (ref 0–200)
HDL: 55 mg/dL (ref >39)
Triglycerides: 122 mg/dL (ref <150)

## 2012-11-16 MED ORDER — HYDROCODONE-ACETAMINOPHEN 10-325 MG PO TABS: 1.0000 | ORAL_TABLET | Freq: Three times a day (TID) | ORAL | Status: DC | PRN

## 2012-11-16 NOTE — Progress Notes (Signed)
Patient here for follow up for DM

## 2012-11-16 NOTE — Patient Instructions (Addendum)
Cirrosis (Cirrhosis) Le han diagnosticado que padece cirrosis. Esta enfermedad consiste en un proceso de cicatrizacin del hgado, que se origina cuando este rgano trata de repararse despus sufrir un dao. El dao puede provenir de una infeccin previa, como la que se produce luego de una de las formas de la hepatitis (generalmente hepatitis C) o tambin por la accin de algunas toxinas. La principal toxina es el alcohol. La cicatrizacin del hgado que causa el alcohol es irreversible. Significa que el hgado no puede volver a su estado normal aunque no se consuma ms alcohol. El dao principal que causa la infeccin por hepatitis C es la enfermedad crnica (de larga duracin) del hgado, y tambin puede conducir a la cirrosis. Esta complicacin es progresiva e irreversible. CAUSAS Antes de disponer de las pruebas de [place], la hepatitis B poda contraerse por transfusiones de [place]. Ahora es bastante improbable, ya que las pruebas han mejorado. Esta infeccin tambin puede adquirirse por el uso de drogas por va intravenosa y por compartir las agujas. Tambin puede contagiarse a travs de las [employer]. La lesin que produce el alcohol se debe al [employer]. El hecho de beber algunos tragos no enfermar al hgado, sino que el riesgo aparece cuando el consumo es excesivo. Generalmente habr algunos signos y sntomas a comienzos del proceso de cicatrizacin del hgado que tendran que alertar para desarrollar mejores hbitos. El alcohol nunca debera consumirse si est tomando acetaminofeno. Pequeas dosis de ambos, consumidos en forma conjunta, pueden causar lesiones irreversibles en el hgado. INSTRUCCIONES PARA EL CUIDADO DOMICILIARIO No existe un tratamiento especfico para la cirrosis; sin embargo hay algunas cosas que usted puede hacer para evitar que la enfermedad empeore.  Descanse todo lo que pueda.  Consuma una dieta normal y bien balanceada. El profesional que lo asiste  podr darle algunas indicaciones.  Suplemento vitamnicos que incluyan vitaminas A, K, D y tiamina pueden [address].  Una dieta baja en sal, restriccin de agua o diurticos podrn ser necesarios para reducir la retencin de lquidos.  Evite el alcohol. Puede ser extremadamente txico si lo combina con acetaminofeno.  Evite los frmacos que son txicos para el hgado. Entre ellas se incluyen: Isoniacida, metildopa acetaminofeno, esteroides anablicos (frmacos que energizan los msculos), eritromicina, y anticonceptivos orales (pldoras para el control de la natalidad). Realice un control con el profesional que lo asiste para comprobar que los medicamentos que est tomando no sern nocivos.  Podr requerir que se le practiquen pruebas de sangre peridicamente. Siga el consejo del profesional que lo asiste con respecto al momento en que deber Journalist, [employer].  La leche de cardo es un remedio derivado de hierbas que protege al hgado de las toxinas; sin embargo, no ser de [place]. SOLICITE ATENCIN MDICA SI:   Presenta fatiga o debilidad en aumento.  Observa que se le [employer], los pies, las piernas o el abdomen.  Tiene vmitos de sangre de color rojo brillante o semejante a la borra del caf.  Observa sangre en la materia fecal, o las heces se tornan negras y de aspecto alquitranado.  Tiene fiebre.  Ha perdido el apetito, siente nuseas o vmitos.  Desarrolla ictericia.  Se le forman moretones o tiene hemorragias con facilidad.  Alguno de los [employer] lo preocupaban, comienzan a Theme park manager. Document Released: 02/22/2005 Document Revised: 05/17/2011 [facility] Patient Information 2014 [place], [place].

## 2012-11-16 NOTE — Progress Notes (Signed)
Patient ID: [name] [name], male   DOB: [birth year], 47 y.o.   MRN: [id number]  CC: follow up  HPI: Pt is 47 yo male who presents to clinic for regular follow up, needs refill on medications. He denies any specific concerns at this time, no recent sicknesses or hospitalizations, no chest pain or shortness of breath and no specific abdominal or urinary concerns. He explains he wants to stop taking metformin as he thinks it is causing a lots of stomach discomfort when he takes it.   No Known Allergies Past Medical History  Diagnosis Date  . Shortness of breath   . Sleep apnea   . Diabetes mellitus without complication   . GERD (gastroesophageal reflux disease)   . H/O hiatal hernia   . Seizures   . Headache(784.0)   . No pertinent past medical history    Current Outpatient Prescriptions on File Prior to Visit  Medication Sig Dispense Refill  . loperamide (IMODIUM A-D) 2 MG tablet Take 2 mg by mouth 4 (four) times daily as needed for diarrhea or loose stools.      Marland Kitchen omeprazole (PRILOSEC) 40 MG capsule Take 40 mg by mouth daily.      . promethazine (PHENERGAN) 25 MG tablet Take 1 tablet (25 mg total) by mouth every 6 (six) hours as needed for nausea.  12 tablet  0  . spironolactone (ALDACTONE) 50 MG tablet Take 1 tablet (50 mg total) by mouth daily.  30 tablet  0   No current facility-administered medications on file prior to visit.   No known family medical history  History   Social History  . Marital Status: Single    Spouse Name: N/A    Number of Children: N/A  . Years of Education: N/A   Occupational History  . Not on file.   Social History Main Topics  . Smoking status: Former Smoker -- 0.25 packs/day for 10 years    Types: Cigarettes    Quit date: 09/30/2007  . Smokeless tobacco: Never Used  . Alcohol Use: No     Comment: quit a year ago  . Drug Use: No  . Sexual Activity: Not Currently   Other Topics Concern  . Not on file   Social History Narrative  . No  narrative on file    Review of Systems  Constitutional: Negative for fever, chills, diaphoresis, activity change, appetite change and fatigue.  HENT: Negative for ear pain, nosebleeds, congestion, facial swelling, rhinorrhea, neck pain, neck stiffness and ear discharge.   Eyes: Negative for pain, discharge, redness, itching and visual disturbance.  Respiratory: Negative for cough, choking, chest tightness, shortness of breath, wheezing and stridor.   Cardiovascular: Negative for chest pain, palpitations and leg swelling.  Gastrointestinal: Negative for abdominal distention.  Genitourinary: Negative for dysuria, urgency, frequency, hematuria, flank pain, decreased urine volume, difficulty urinating and dyspareunia.  Musculoskeletal: Negative for back pain, joint swelling, arthralgias and gait problem.  Neurological: Negative for dizziness, tremors, seizures, syncope, facial asymmetry, speech difficulty, weakness, light-headedness, numbness and headaches.  Hematological: Negative for adenopathy. Does not bruise/bleed easily.  Psychiatric/Behavioral: Negative for hallucinations, behavioral problems, confusion, dysphoric mood, decreased concentration and agitation.    Objective:   Filed Vitals:   11/16/12 1402  BP: 127/67  Pulse: 83  Temp: 98 F (36.7 C)  Resp: 17    Physical Exam  Constitutional: Appears well-developed and well-nourished. No distress.   CVS: RRR, S1/S2 +, no murmurs, no gallops, no carotid bruit.  Pulmonary: Effort and  breath sounds normal, no stridor, rhonchi, wheezes, rales.  Abdominal: Soft. BS +,  no distension, tenderness, rebound or guarding.  Neuro: Alert. Normal reflexes, muscle tone coordination. No cranial nerve deficit.   Lab Results  Component Value Date   WBC 3.6* 10/31/2012   HGB 12.8* 10/31/2012   HCT 36.1* 10/31/2012   MCV 95.3 10/31/2012   PLT 53* 10/31/2012   Lab Results  Component Value Date   CREATININE 0.58 10/31/2012   BUN 8 10/31/2012   NA  137 10/31/2012   K 4.0 10/31/2012   CL 107 10/31/2012   CO2 23 10/31/2012    Lab Results  Component Value Date   HGBA1C 5.9* 10/21/2012   Lipid Panel  No results found for this basename: chol, trig, hdl, cholhdl, vldl, ldlcalc       Assessment and plan:   Patient Active Problem List   Diagnosis Date Noted  . Liver cirrhosis, alcoholic - appears to be clinically stable 11/02/2012  . DM (diabetes mellitus) type 2 - will re check A1C to decide if pt needs antihyperglycemic regimen. I told him to stop taking it temporarily and to watch his diet and monitor sugar levels closely. I advised him to call us if his CBG is persistently higher than 150 before meals.  11/02/2012  . Alcohol abuse - denies use over the past 2 weeks 01/29/2012

## 2012-11-27 ENCOUNTER — Other Ambulatory Visit: Payer: Self-pay | Admitting: Internal Medicine

## 2012-11-27 DIAGNOSIS — M549 Dorsalgia, unspecified: Secondary | ICD-10-CM

## 2012-12-07 ENCOUNTER — Ambulatory Visit: Payer: 59

## 2013-02-23 ENCOUNTER — Ambulatory Visit: Payer: 59

## 2013-02-23 ENCOUNTER — Ambulatory Visit: Payer: 59 | Admitting: Internal Medicine

## 2013-05-24 ENCOUNTER — Encounter (HOSPITAL_COMMUNITY): Payer: Self-pay | Admitting: Emergency Medicine

## 2013-05-24 ENCOUNTER — Emergency Department (HOSPITAL_COMMUNITY): Payer: 59

## 2013-05-24 ENCOUNTER — Emergency Department (HOSPITAL_COMMUNITY)
Admission: EM | Admit: 2013-05-24 | Discharge: 2013-05-24 | Disposition: A | Discharge status: Home / Self Care | Payer: 59 | Attending: Emergency Medicine | Admitting: Emergency Medicine

## 2013-05-24 DIAGNOSIS — D696 Thrombocytopenia, unspecified: Secondary | ICD-10-CM | POA: Insufficient documentation

## 2013-05-24 DIAGNOSIS — R609 Edema, unspecified: Secondary | ICD-10-CM

## 2013-05-24 DIAGNOSIS — R51 Headache: Secondary | ICD-10-CM | POA: Insufficient documentation

## 2013-05-24 DIAGNOSIS — K219 Gastro-esophageal reflux disease without esophagitis: Secondary | ICD-10-CM | POA: Insufficient documentation

## 2013-05-24 DIAGNOSIS — R519 Headache, unspecified: Secondary | ICD-10-CM

## 2013-05-24 DIAGNOSIS — E119 Type 2 diabetes mellitus without complications: Secondary | ICD-10-CM | POA: Insufficient documentation

## 2013-05-24 DIAGNOSIS — Z79899 Other long term (current) drug therapy: Secondary | ICD-10-CM | POA: Insufficient documentation

## 2013-05-24 DIAGNOSIS — D72819 Decreased white blood cell count, unspecified: Secondary | ICD-10-CM | POA: Insufficient documentation

## 2013-05-24 DIAGNOSIS — Z8669 Personal history of other diseases of the nervous system and sense organs: Secondary | ICD-10-CM | POA: Insufficient documentation

## 2013-05-24 DIAGNOSIS — Z87891 Personal history of nicotine dependence: Secondary | ICD-10-CM | POA: Insufficient documentation

## 2013-05-24 DIAGNOSIS — K7030 Alcoholic cirrhosis of liver without ascites: Secondary | ICD-10-CM | POA: Insufficient documentation

## 2013-05-24 LAB — COMPREHENSIVE METABOLIC PANEL
ALBUMIN: 2.8 g/dL — AB (ref 3.5–5.2)
ALK PHOS: 83 U/L (ref 39–117)
ALT: 28 U/L (ref 0–53)
AST: 40 U/L — ABNORMAL HIGH (ref 0–37)
BILIRUBIN TOTAL: 1.4 mg/dL — AB (ref 0.3–1.2)
BUN: 4 mg/dL — AB (ref 6–23)
CHLORIDE: 104 meq/L (ref 96–112)
CO2: 25 mEq/L (ref 19–32)
Calcium: 7.9 mg/dL — ABNORMAL LOW (ref 8.4–10.5)
Creatinine, Ser: 0.41 mg/dL — ABNORMAL LOW (ref 0.50–1.35)
GFR calc Af Amer: >90 mL/min (ref >90)
GFR calc non Af Amer: >90 mL/min (ref >90)
Glucose, Bld: 263 mg/dL — ABNORMAL HIGH (ref 70–99)
POTASSIUM: 3.4 meq/L — AB (ref 3.7–5.3)
SODIUM: 141 meq/L (ref 137–147)
TOTAL PROTEIN: 6.2 g/dL (ref 6.0–8.3)

## 2013-05-24 LAB — CBC WITH DIFFERENTIAL/PLATELET
BASOS PCT: 1 % (ref 0–1)
Basophils Absolute: 0 10*3/uL (ref 0.0–0.1)
Eosinophils Absolute: 0.2 10*3/uL (ref 0.0–0.7)
Eosinophils Relative: 6 % — ABNORMAL HIGH (ref 0–5)
HCT: 39.5 % (ref 39.0–52.0)
HEMOGLOBIN: 14.4 g/dL (ref 13.0–17.0)
LYMPHS ABS: 1.3 10*3/uL (ref 0.7–4.0)
Lymphocytes Relative: 33 % (ref 12–46)
MCH: 34.4 pg — ABNORMAL HIGH (ref 26.0–34.0)
MCHC: 36.5 g/dL — AB (ref 30.0–36.0)
MCV: 94.3 fL (ref 78.0–100.0)
MONOS PCT: 8 % (ref 3–12)
Monocytes Absolute: 0.3 10*3/uL (ref 0.1–1.0)
NEUTROS ABS: 2.1 10*3/uL (ref 1.7–7.7)
NEUTROS PCT: 53 % (ref 43–77)
PLATELETS: 52 10*3/uL — AB (ref 150–400)
RBC: 4.19 MIL/uL — AB (ref 4.22–5.81)
RDW: 14.5 % (ref 11.5–15.5)
WBC: 3.9 10*3/uL — ABNORMAL LOW (ref 4.0–10.5)

## 2013-05-24 LAB — D-DIMER, QUANTITATIVE: D-Dimer, Quant: 0.44 ug/mL-FEU (ref 0.00–0.48)

## 2013-05-24 MED ORDER — FUROSEMIDE 10 MG/ML IJ SOLN: 40.0000 mg | Freq: Once | INTRAMUSCULAR | Status: DC

## 2013-05-24 MED ORDER — FUROSEMIDE 20 MG PO TABS
40.0000 mg | ORAL_TABLET | Freq: Once | ORAL | Status: AC
  Administered 2013-05-24: 40 mg via ORAL
  Filled 2013-05-24: qty 2

## 2013-05-24 MED ORDER — TRAMADOL HCL 50 MG PO TABS: 50.0000 mg | ORAL_TABLET | Freq: Four times a day (QID) | ORAL | Status: DC | PRN

## 2013-05-24 MED ORDER — FUROSEMIDE 40 MG PO TABS: 40.0000 mg | ORAL_TABLET | Freq: Every day | ORAL | Status: DC

## 2013-05-24 MED ORDER — ACETAMINOPHEN 325 MG PO TABS
650.0000 mg | ORAL_TABLET | Freq: Once | ORAL | Status: AC
  Administered 2013-05-24: 650 mg via ORAL
  Filled 2013-05-24: qty 2

## 2013-05-24 NOTE — ED Provider Notes (Signed)
CSN: [id number]     Arrival date & time 05/24/13  0316 History   First MD Initiated Contact with Patient 05/24/13 [phone]     Chief Complaint  Patient presents with  . Leg Pain  . Headache     (Consider location/radiation/quality/duration/timing/severity/associated sxs/prior Treatment) Patient is a 48 y.o. male presenting with leg pain and headaches. The history is provided by the patient.  Leg Pain Headache He complains of pains in both legs and headache for the last 5 days. He denies fever or chills. He denies chest pain. He denies nausea or vomiting. He denies difficulty breathing. He has also noted swelling in his legs. He rates pain at 8/10. Nothing makes it better nothing makes it worse.  Past Medical History  Diagnosis Date  . Shortness of breath   . Sleep apnea   . Diabetes mellitus without complication   . GERD (gastroesophageal reflux disease)   . H/O hiatal hernia   . Seizures   . Headache(784.0)   . No pertinent past medical history    Past Surgical History  Procedure Laterality Date  . Appendectomy    . No past surgeries    . Radiology with anesthesia  01/30/2012    Procedure: RADIOLOGY WITH ANESTHESIA;  Surgeon: Casimiro Needle T. Miles Costain, MD;  Location: [facility] OR;  Service: Radiology;  Laterality: N/A;   No family history on file. History  Substance Use Topics  . Smoking status: Former Smoker -- 0.25 packs/day for 10 years    Types: Cigarettes    Quit date: 09/30/2007  . Smokeless tobacco: Never Used  . Alcohol Use: No     Comment: quit a year ago    Review of Systems  Neurological: Positive for headaches.  All other systems reviewed and are negative.      Allergies  Review of patient's allergies indicates no known allergies.  Home Medications   Current Outpatient Rx  Name  Route  Sig  Dispense  Refill  . HYDROcodone-acetaminophen (NORCO) 10-325 MG per tablet   Oral   Take 1 tablet by mouth every 8 (eight) hours as needed for pain.   90 tablet   0   .  loperamide (IMODIUM A-D) 2 MG tablet   Oral   Take 2 mg by mouth 4 (four) times daily as needed for diarrhea or loose stools.         Marland Kitchen omeprazole (PRILOSEC) 40 MG capsule   Oral   Take 40 mg by mouth daily.         . promethazine (PHENERGAN) 25 MG tablet   Oral   Take 1 tablet (25 mg total) by mouth every 6 (six) hours as needed for nausea.   12 tablet   0   . spironolactone (ALDACTONE) 50 MG tablet   Oral   Take 1 tablet (50 mg total) by mouth daily.   30 tablet   0    BP 151/78  Pulse 84  Temp(Src) 98.4 F (36.9 C) (Oral)  Resp 19  SpO2 94% Physical Exam  Nursing note and vitals reviewed.  48 year old male, resting comfortably and in no acute distress. Vital signs are significant for hypertension with blood pressure 151/78. Oxygen saturation is 94%, which is normal. Head is normocephalic and atraumatic. PERRLA, EOMI. Oropharynx is clear. Fundi show no hemorrhage, exudate, or papilledema. Neck is nontender and supple without adenopathy or JVD. Back is nontender and there is no CVA tenderness. Lungs are clear without rales, wheezes, or rhonchi. Chest is  nontender. Heart has regular rate and rhythm without murmur. Abdomen is soft, flat, nontender without masses or hepatosplenomegaly and peristalsis is normoactive. Extremities have 2+ edema, full range of motion is present. Skin is warm and dry without rash. Neurologic: Mental status is normal, cranial nerves are intact, there are no motor or sensory deficits.  ED Course  Procedures (including critical care time) Labs Review Results for orders placed during the hospital encounter of 05/24/13  CBC WITH DIFFERENTIAL      Result Value Ref Range   WBC 3.9 (*) 4.0 - 10.5 K/uL   RBC 4.19 (*) 4.22 - 5.81 MIL/uL   Hemoglobin 14.4  13.0 - 17.0 g/dL   HCT 16.1  09.6 - 04.5 %   MCV 94.3  78.0 - 100.0 fL   MCH 34.4 (*) 26.0 - 34.0 pg   MCHC 36.5 (*) 30.0 - 36.0 g/dL   RDW 40.9  81.1 - 91.4 %   Platelets 52 (*) 150 - 400  K/uL   Neutrophils Relative % 53  43 - 77 %   Neutro Abs 2.1  1.7 - 7.7 K/uL   Lymphocytes Relative 33  12 - 46 %   Lymphs Abs 1.3  0.7 - 4.0 K/uL   Monocytes Relative 8  3 - 12 %   Monocytes Absolute 0.3  0.1 - 1.0 K/uL   Eosinophils Relative 6 (*) 0 - 5 %   Eosinophils Absolute 0.2  0.0 - 0.7 K/uL   Basophils Relative 1  0 - 1 %   Basophils Absolute 0.0  0.0 - 0.1 K/uL  COMPREHENSIVE METABOLIC PANEL      Result Value Ref Range   Sodium 141  137 - 147 mEq/L   Potassium 3.4 (*) 3.7 - 5.3 mEq/L   Chloride 104  96 - 112 mEq/L   CO2 25  19 - 32 mEq/L   Glucose, Bld 263 (*) 70 - 99 mg/dL   BUN 4 (*) 6 - 23 mg/dL   Creatinine, Ser 7.82 (*) 0.50 - 1.35 mg/dL   Calcium 7.9 (*) 8.4 - 10.5 mg/dL   Total Protein 6.2  6.0 - 8.3 g/dL   Albumin 2.8 (*) 3.5 - 5.2 g/dL   AST 40 (*) 0 - 37 U/L   ALT 28  0 - 53 U/L   Alkaline Phosphatase 83  39 - 117 U/L   Total Bilirubin 1.4 (*) 0.3 - 1.2 mg/dL   GFR calc non Af Amer >90  >90 mL/min   GFR calc Af Amer >90  >90 mL/min  D-DIMER, QUANTITATIVE      Result Value Ref Range   D-Dimer, Quant 0.44  0.00 - 0.48 ug/mL-FEU   Imaging Review Dg Chest 2 View  05/24/2013   CLINICAL DATA:  Pain and swelling  EXAM: CHEST  2 VIEW  COMPARISON:  Prior radiograph from 10/21/2012  FINDINGS: The cardiac and mediastinal silhouettes are stable in size and contour, and remain within normal limits.  The lungs are normally inflated. No airspace consolidation, pleural effusion, or pulmonary edema is identified. There is no pneumothorax.  No acute osseous abnormality identified. Multilevel degenerative changes noted within the visualized spine. Embolization coils overlie the upper abdomen, unchanged.  IMPRESSION: No active cardiopulmonary disease.   Electronically Signed   By: Rise Mu M.D.   On: 05/24/2013 04:45     EKG Interpretation   Date/Time:  Thursday May 24 2013 03:39:03 EDT Ventricular Rate:  87 PR Interval:  183 QRS Duration: 120 QT Interval:  385 QTC Calculation: 463 R Axis:   -42 Text Interpretation:  Age not entered, assumed to be  48 years old for  purpose of ECG interpretation Sinus rhythm Nonspecific IVCD with LAD  Incomplete right bundle branch block When compared with ECG of 10/21/2012,  No significant change was found Confirmed by [facility]GL[facility]  MD, [place] ([id number]) on  05/24/2013 4:02:55 AM      MDM   Final diagnoses:  Peripheral edema  Liver cirrhosis, alcoholic  Leukopenia  Thrombocytopenia  Headache    Generalized aching with a new onset leg swelling. Reason is not clear at this time. Screening labs are obtained.  Workup is significant for thrombocytopenia and leukopenia which have been present previously. This is apparently secondary to cirrhosis. At this point, his pain seems to be from the swelling in his legs which is related to cirrhosis and hypoalbuminemia. No evidence of any more serious pathology. D-dimer is negative. He is discharged with prescription for furosemide and tramadol for pain and is to followup with his physician in about 5 days for reassessment.  Dione Boozeavid Kemia Wendel, MD 05/24/13 [phone]

## 2013-05-24 NOTE — Discharge Instructions (Signed)
Cirrosis (Cirrhosis) Le han diagnosticado que padece cirrosis. Esta enfermedad consiste en un proceso de cicatrizacin del hgado, que se origina cuando este rgano trata de repararse despus sufrir un dao. El dao puede provenir de una infeccin previa, como la que se produce luego de una de las formas de la hepatitis (generalmente hepatitis C) o tambin por la accin de algunas toxinas. La principal toxina es el alcohol. La cicatrizacin del hgado que causa el alcohol es irreversible. Significa que el hgado no puede volver a su estado normal aunque no se consuma ms alcohol. El dao principal que causa la infeccin por hepatitis C es la enfermedad crnica (de larga duracin) del hgado, y tambin puede conducir a la cirrosis. Esta complicacin es progresiva e irreversible. CAUSAS Antes de disponer de las pruebas de [place], la hepatitis B poda contraerse por transfusiones de [place]. Ahora es bastante improbable, ya que las pruebas han mejorado. Esta infeccin tambin puede adquirirse por el uso de drogas por va intravenosa y por compartir las agujas. Tambin puede contagiarse a travs de las [employer]. La lesin que produce el alcohol se debe al [employer]. El hecho de beber algunos tragos no enfermar al hgado, sino que el riesgo aparece cuando el consumo es excesivo. Generalmente habr algunos signos y sntomas a comienzos del proceso de cicatrizacin del hgado que tendran que alertar para desarrollar mejores hbitos. El alcohol nunca debera consumirse si est tomando acetaminofeno. Pequeas dosis de ambos, consumidos en forma conjunta, pueden causar lesiones irreversibles en el hgado. INSTRUCCIONES PARA EL CUIDADO DOMICILIARIO No existe un tratamiento especfico para la cirrosis; sin embargo hay algunas cosas que usted puede hacer para evitar que la enfermedad empeore.  Descanse todo lo que pueda.  Consuma una dieta normal y bien balanceada. El profesional que lo asiste  podr darle algunas indicaciones.  Suplemento vitamnicos que incluyan vitaminas A, K, D y tiamina pueden [address].  Una dieta baja en sal, restriccin de agua o diurticos podrn ser necesarios para reducir la retencin de lquidos.  Evite el alcohol. Puede ser extremadamente txico si lo combina con acetaminofeno.  Evite los frmacos que son txicos para el hgado. Entre ellas se incluyen: Isoniacida, metildopa acetaminofeno, esteroides anablicos (frmacos que energizan los msculos), eritromicina, y anticonceptivos orales (pldoras para el control de la natalidad). Realice un control con el profesional que lo asiste para comprobar que los medicamentos que est tomando no sern nocivos.  Podr requerir que se le practiquen pruebas de sangre peridicamente. Siga el consejo del profesional que lo asiste con respecto al momento en que deber Journalist, newspaper.  La leche de cardo es un remedio derivado de hierbas que protege al hgado de las toxinas; sin embargo, no ser de [place]. SOLICITE ATENCIN MDICA SI:   Presenta fatiga o debilidad en aumento.  Observa que se le [employer], los pies, las piernas o el abdomen.  Tiene vmitos de sangre de color rojo brillante o semejante a la borra del caf.  Observa sangre en la materia fecal, o las heces se tornan negras y de aspecto alquitranado.  Tiene fiebre.  Ha perdido el apetito, siente nuseas o vmitos.  Desarrolla ictericia.  Se le forman moretones o tiene hemorragias con facilidad.  Alguno de los [employer] lo preocupaban, comienzan a Theme park manager. Document Released: 02/22/2005 Document Revised: 05/17/2011 [facility] Patient Information 2014 [place], [place].  Edema perifrico (Peripheral Edema) Usted sufre hinchazn en las piernas, lo que se denomina edema perifrico. Esta hinchazn se debe al exceso de acumulacin  de sal y agua en el organismo. El edema puede ser un signo de enfermedad cardaca, renal o heptica, o el efecto  secundario de un medicamento. Tambin puede deberse a otros problemas en las venas de las piernas. Si el problema se debe a una circulacin venosa deficiente, eleve las piernas y [place] medias especiales de soporte. Evite permanecer de pie durante largos perodos. El tratamiento depende de la causa. Los chips, pickles y otros alimentos salados debern evitarse. Casi siempre es necesario restringir la sal en la dieta. Le indicarn diurticos para eliminar el exceso de sal y agua del organismo por la [place]. Estos medicamentos evitan que el rin absorba el sodio. Aumentan el flujo de [place]. El tratamiento con diurticos tambin [employer] niveles de potasio del [place]. Ser necesario que utilice suplementos de potasio si toma diurticos [employer]. Controle el peso diario para verificar los progresos en la mejora del edema. Comunquese con su mdico para realizar un control segn las indicaciones. SOLICITE ATENCIN MDICA DE INMEDIATO SI:  Aumenta en gran medida la hinchazn, el dolor, la inflamacin o el calor en las piernas.  Le falta el aire, especialmente estando [place].  Siente dolor en el pecho o en el abdomen, debilidad, o se marea.  Tiene fiebre. Document Released: 02/22/2005 Document Revised: 05/17/2011 [facility] Patient Information 2014 [place], [place].  Furosemide tablets Qu es este medicamento? La FUROSEMIDA es un diurtico. Ayuda a incrementar el volumen de [place], lo que provoca que el cuerpo pierda sal y agua que estn en exceso. Este medicamento se [place] para tratar la alta presin sangunea, edema o hinchazn causadas por enfermedades cardacas, hepticas o renales. Este medicamento puede ser utilizado para otros usos; si tiene alguna pregunta consulte con su proveedor de atencin mdica o con su farmacutico. MARCAS COMERCIALES DISPONIBLES: [place] , [place] Qu le debo informar a mi profesional de la salud antes de tomar este medicamento? Necesita saber si usted  presenta alguno de los siguientes problemas o situaciones: -nivel anormal de electrlitos en la sangre -diarrea o vmito -gota -enfermedad cardaca -enfermedad renal, bajo volumen de orina o dificultad para orinar -enfermedad heptica -una reaccin alrgica o inusual a la furosemida, sulfonamidas, a otros medicamentos, alimentos, colorantes o conservantes -si est embarazada o buscando quedar embarazada -si est amamantando a un beb Cmo debo utilizar este medicamento? Tome este medicamento por va oral con un vaso de agua. Siga las instrucciones de la etiqueta del [place]. Este medicamento se puede tomar con o sin alimentos. Si le produce Programme researcher, broadcasting/film/video, tmelo con alimentos o con [employer]. No tome su medicamento con una frecuencia mayor que la indicada. Recuerde que Psychologist, clinical con frecuencia despus de Financial risk analyst. No tome sus dosis en horarios que le causen problemas. No lo tome a la hora de [employer] Hable con su pediatra para informarse acerca del uso de este medicamento en nios. Aunque este medicamento se puede recetar para condiciones selectivas, las precauciones se aplican. Sobredosis: Pngase en contacto inmediatamente con un centro toxicolgico o una sala de urgencia si usted cree que haya tomado demasiado medicamento. ATENCIN: [employer] es solo para usted. No comparta este medicamento con nadie. Qu sucede si me olvido de una dosis? Si olvida una dosis, tmela lo antes posible. Si es casi la hora de la prxima dosis, tome slo esa dosis. No tome dosis adicionales o dobles. Qu puede interactuar con este medicamento? -aspirina o medicamentos tipo aspirina -ciertos antibiticos  -hidrato de cloral -cisplatino -ciclosporina -digoxina -diurticos -laxantes -litio -medicamentos para la  presin sangunea -medicamentos para relajar los msculos para una ciruga -metotrexato -los [place], medicamentos para el dolor o inflamacin, como ibuprofeno,  naproxeno o indometacina -fenitona -esteroides, tales como prednisona o cortisona -sucralfato Puede ser que esta lista no menciona todas las posibles interacciones. Informe a su profesional de [employer] de [employer] productos a base de hierbas, medicamentos de [place] o suplementos nutritivos que est tomando. Si usted fuma, consume bebidas alcohlicas o si utiliza drogas ilegales, indqueselo tambin a su profesional de [employer]. Algunas sustancias pueden interactuar con su medicamento. A qu debo estar atento al usar [employer]? Visite a su mdico o a su profesional de la salud para chequear su evolucin peridicamente. Controle su presin sangunea regularmente. Pregunte a su mdico o a su profesional de la salud cul debe ser su presin sangunea y cundo deber comunicarse con l/ella. Si es diabtico controle su nivel de azcar en la sangre como le haya indicado. Puede necesitar seguir una dieta especial mientras est tomando este medicamento. Consulte a su mdico acerca de esto. Tambin, pregunte a su mdico cunto lquido debe beber [employer]. No debe deshidratarse. Puede experimentar somnolencia o mareos. No conduzca ni utilice maquinaria, ni haga nada que Scientist, research (life sciences) en estado de alerta hasta que sepa cmo le afecta este medicamento. No se siente ni se ponga de pie con rapidez, especialmente si es un paciente de edad avanzada. Esto reduce el riesgo de mareos o [employer]. El alcohol puede aumentar su somnolencia y [place]. Evite consumir bebidas alcohlicas. Este medicamento puede aumentar su sensibilidad al sol. Mantngase fuera de Secretary/administrator. Si no lo puede evitar, utilice ropa protectora y crema de Orthoptist. No utilice lmparas solares, camas solares ni cabinas solares. Qu efectos secundarios puedo tener al [employer] este medicamento? Efectos secundarios que debe informar a su mdico o a Producer, television/film/video de la salud tan pronto como sea posible: -sangre en la orina o en  sus heces -boca seca -fiebre o escalofros -prdida de la audicin, zumbido en los odos -latidos cardiacos irregulares -calambre, Engineer, mining o debilidad muscular -erupcin cutnea -malestar o dolor estomacal, nuseas -hormigueo o entumecimiento de manos o pies -cansancio o debilidad inusual -diarrea o vmito -color amarillento de ojos o piel Efectos secundarios que, por lo general, no requieren atencin mdica (debe informarlos a su mdico o a su profesional de la salud si persisten o si son molestos): -dolor de cabeza -prdida del apetito -sangrado o magulladuras inusuales Puede ser que esta lista no menciona todos los posibles efectos secundarios. Comunquese a su mdico por asesoramiento mdico [employer]. Usted puede informar los efectos secundarios a la FDA por telfono al 1-800-FDA-1088. Dnde debo guardar mi medicina? Mantngala fuera del alcance de los nios. Gurdela a [employer], entre 15 y 30 grados C (30 y 79 grados F). Protjala de la luz. Deseche los medicamentos que no haya utilizado, despus de la fecha de vencimiento. ATENCIN: Este folleto es un resumen. Puede ser que no cubra toda la posible informacin. Si usted tiene preguntas acerca de esta medicina, consulte con su mdico, su farmacutico o su profesional de Radiographer, therapeutic.  2014, Elsevier/Gold Standard. (2009-03-17 17:03:39)   Tramadol tablets Qu es este medicamento? El TRAMADOL es un analgsico. Se utiliza para tratar dolores moderados o severos en adultos. Este medicamento puede ser utilizado para otros usos; si tiene alguna pregunta consulte con su proveedor de atencin mdica o con su farmacutico. MARCAS COMERCIALES DISPONIBLES: Ultram Qu le debo informar a mi profesional  de la salud antes de tomar este medicamento? Necesita saber si usted presenta alguno de los Coventry Health Caresiguientes problemas o situaciones: -tumor cerebral -depresin  -abuso de drogas o drogadiccin -lesin de la  cabeza -si consume bebidas alcohlicas con frecuencia -enfermedad renal o problemas al orinar -enfermedad heptica -enfermedad pulmonar, asma o problemas respiratorios -convulsiones o epilepsia -ideas suicidas, planes o intento; si usted o alguien de su familia ha intentado un suicidio previo -una reaccin alrgica o inusual al tramadol, a la codena, a otros medicamentos, alimentos, colorantes o conservantes -si est embarazada o buscando quedar embarazada -si est amamantando a un beb Cmo debo utilizar este medicamento? Tome este medicamento por va oral con un vaso lleno de agua. Siga las instrucciones de la etiqueta del Clydemedicamento. Si el Social workermedicamento le produce malestar estomacal, tmelo con alimentos o con [place]. No tome su medicamento con una frecuencia mayor que la indicada. Hable con su pediatra para informarse acerca del uso de este medicamento en nios. Puede requerir atencin especial. Sobredosis: Pngase en contacto inmediatamente con un centro toxicolgico o una sala de urgencia si usted cree que haya tomado demasiado medicamento. ATENCIN: Reynolds AmericanEste medicamento es solo para usted. No comparta este medicamento con nadie. Qu sucede si me olvido de una dosis? Si olvida una dosis, tmela lo antes posible. Si es casi la hora de la prxima dosis, tome slo esa dosis. No tome dosis adicionales o dobles. Qu puede interactuar con este medicamento? No tome esta medicina con ninguno de los siguientes medicamentos: -IMAOs, tales como Carbex, Eldepryl, Marplan, [place] y [place] Esta medicina tambin puede interactuar con los siguientes medicamentos: -alcohol o medicamentos que contienen alcohol -antihistamnicos -benzodiacepinas -bupropion -carbamazepina u oxcarbazepina -clozapina -ciclobenzaprina -digoxina -furazolidona -linezolid -medicamentos para la depresin, ansiedad o trastornos psicticos -medicamentos para las migraas, tales como almotriptn, eletriptn, frovatriptn,  naratriptn, rizatriptn, sumatriptn, zolmitriptn -analgsicos, incluyendo pentazocina, buprenorfina, butorfanol, meperidina, nalbufina y propoxifeno -medicamentos para conciliar el sueo -relajantes musculares -naltrexona -fenobarbital -fenotiazinas, tales como perfenacina, tioridazina, clorpromacina, mesoridazina, flufenazina, proclorperazina, promazina y trifluoperazina -procarbazina -warfarina Puede ser que esta lista no menciona todas las posibles interacciones. Informe a su profesional de Beazer Homesla salud de [employer] Inctodos los productos a base de hierbas, medicamentos de [place] libre o suplementos nutritivos que est tomando. Si usted fuma, consume bebidas alcohlicas o si utiliza drogas ilegales, indqueselo tambin a su profesional de Beazer Homesla salud. Algunas sustancias pueden interactuar con su medicamento. A qu debo estar atento al usar PPL Corporationeste medicamento? Si el dolor no desaparece, si empeora o si experimenta un dolor nuevo o de tipo diferente, consulte a su mdico o a su profesional de Beazer Homesla salud. Usted puede desarrollar tolerancia al medicamento. La tolerancia significa que necesitar una dosis ms alta para Engineer, materialsaliviar el dolor. Tolerancia es normal y esperada cuando est tomando este medicamento por un largo perodo de [place]. No suspenda el uso de su medicamento repentinamente debido a que puede Copywriter, advertisingdesarrollar una reaccin severa. Su cuerpo se acostumbra a Industrial/product designereste medicamento. Esto NO significa que sea adicto. La adiccin es un comportamiento que hace referencia a la obtencin y utilizacin de un medicamento con fines que no son mdicos. Si tiene Engineer, miningdolor, existe una razn mdica para que usted tome un analgsico. Su mdico le indicar la cantidad de medicamento que Mudloggernecesitar tomar. Si su mdico desea que Col[place]pare el tratamiento, la dosis ser reducida gradualmente para Psychiatric nurseevitar efectos secundarios. Puede experimentar mareos o somnolencia. No conduzca ni utilice maquinaria, ni haga nada que Scientist, research (life sciences)le exija permanecer en estado de alerta  hasta que sepa cmo le afecta  este medicamento. No se siente ni se ponga de pie con rapidez, especialmente si es un paciente de edad avanzada. Esto reduce el riesgo de mareos o [employer]. El alcohol puede aumentar o disminuir el efecto de [place]. Evite consumir bebidas alcohlicas. Este medicamento puede causar estreimiento. Trate de evacuar los intestinos al menos cada 2  3 das. Si no evacua los intestinos durante [address], comunquese con su mdico o con su profesional de [employer]. Se le podr secar la boca. Masticar chicle sin azcar, chupar caramelos duros y tomar agua en abundancia le ayudar a mantener la boca hmeda. Si el problema no desaparece o es severo, consulte a su mdico. Qu efectos secundarios puedo tener al [employer] este medicamento? Efectos secundarios que debe informar a su mdico o a Producer, television/film/video de la salud tan pronto como sea posible: -Therapist, art como erupcin cutnea, picazn o urticarias, hinchazn de la cara, labios o lengua -dificultades respiratorias, sibilancias -confusin -picazn -aturdimiento o desmayos -enrojecimiento, formacin de ampollas, descamacin o aflojamiento de la piel, inclusive dentro de la boca -convulsiones Efectos secundarios que, por lo general, no requieren atencin mdica (debe informarlos a su mdico o a su profesional de la salud si persisten o si son molestos): -estreimiento -mareos -somnolencia -dolor de cabeza -nuseas, vmitos Puede ser que esta lista no menciona todos los posibles efectos secundarios. Comunquese a su mdico por asesoramiento mdico [employer]. Usted puede informar los efectos secundarios a la FDA por telfono al 1-800-FDA-1088. Dnde debo guardar mi medicina? Mantngala fuera del alcance de los nios. Gurdela a [employer], entre 15 y 30 grados C (39 y 46 grados F). Mantenga el envase bien cerrado. Deseche los medicamentos que no haya utilizado, despus de la fecha de  vencimiento. ATENCIN: Este folleto es un resumen. Puede ser que no cubra toda la posible informacin. Si usted tiene preguntas acerca de esta medicina, consulte con su mdico, su farmacutico o su profesional de Radiographer, therapeutic.  2014, Elsevier/Gold Standard. (2009-12-03 17:11:17)

## 2013-05-24 NOTE — ED Notes (Signed)
Patient presents to ED via POV. Patient c/o of a "headache" x 3 days. No acute neuro deficits noted at this time. Pt also c/o of bilateral leg swelling that also began 3 days ago. No acute distress noted at this time. A&Ox4.

## 2013-06-11 ENCOUNTER — Emergency Department (HOSPITAL_COMMUNITY): Payer: 59

## 2013-06-11 ENCOUNTER — Encounter (HOSPITAL_COMMUNITY): Payer: Self-pay | Admitting: Emergency Medicine

## 2013-06-11 ENCOUNTER — Inpatient Hospital Stay (HOSPITAL_COMMUNITY)
Admission: EM | Admit: 2013-06-11 | Discharge: 2013-06-17 | DRG: 071 | Disposition: A | Discharge status: Home / Self Care | Payer: Self-pay | Attending: Internal Medicine | Admitting: Internal Medicine

## 2013-06-11 ENCOUNTER — Observation Stay (HOSPITAL_COMMUNITY): Payer: MEDICAID

## 2013-06-11 ENCOUNTER — Observation Stay (HOSPITAL_COMMUNITY): Payer: 59

## 2013-06-11 DIAGNOSIS — G9340 Encephalopathy, unspecified: Principal | ICD-10-CM

## 2013-06-11 DIAGNOSIS — K709 Alcoholic liver disease, unspecified: Secondary | ICD-10-CM | POA: Diagnosis present

## 2013-06-11 DIAGNOSIS — R161 Splenomegaly, not elsewhere classified: Secondary | ICD-10-CM

## 2013-06-11 DIAGNOSIS — Z87891 Personal history of nicotine dependence: Secondary | ICD-10-CM

## 2013-06-11 DIAGNOSIS — K7030 Alcoholic cirrhosis of liver without ascites: Secondary | ICD-10-CM

## 2013-06-11 DIAGNOSIS — I8510 Secondary esophageal varices without bleeding: Secondary | ICD-10-CM

## 2013-06-11 DIAGNOSIS — R279 Unspecified lack of coordination: Secondary | ICD-10-CM | POA: Diagnosis present

## 2013-06-11 DIAGNOSIS — R109 Unspecified abdominal pain: Secondary | ICD-10-CM

## 2013-06-11 DIAGNOSIS — F10229 Alcohol dependence with intoxication, unspecified: Secondary | ICD-10-CM | POA: Diagnosis present

## 2013-06-11 DIAGNOSIS — R0902 Hypoxemia: Secondary | ICD-10-CM | POA: Diagnosis present

## 2013-06-11 DIAGNOSIS — E1110 Type 2 diabetes mellitus with ketoacidosis without coma: Secondary | ICD-10-CM

## 2013-06-11 DIAGNOSIS — Z9049 Acquired absence of other specified parts of digestive tract: Secondary | ICD-10-CM

## 2013-06-11 DIAGNOSIS — M25569 Pain in unspecified knee: Secondary | ICD-10-CM

## 2013-06-11 DIAGNOSIS — R29898 Other symptoms and signs involving the musculoskeletal system: Secondary | ICD-10-CM

## 2013-06-11 DIAGNOSIS — Z9089 Acquired absence of other organs: Secondary | ICD-10-CM

## 2013-06-11 DIAGNOSIS — D6959 Other secondary thrombocytopenia: Secondary | ICD-10-CM

## 2013-06-11 DIAGNOSIS — R259 Unspecified abnormal involuntary movements: Secondary | ICD-10-CM | POA: Diagnosis present

## 2013-06-11 DIAGNOSIS — Z91199 Patient's noncompliance with other medical treatment and regimen due to unspecified reason: Secondary | ICD-10-CM

## 2013-06-11 DIAGNOSIS — Z8719 Personal history of other diseases of the digestive system: Secondary | ICD-10-CM

## 2013-06-11 DIAGNOSIS — Z9119 Patient's noncompliance with other medical treatment and regimen: Secondary | ICD-10-CM

## 2013-06-11 DIAGNOSIS — F1020 Alcohol dependence, uncomplicated: Secondary | ICD-10-CM

## 2013-06-11 DIAGNOSIS — E119 Type 2 diabetes mellitus without complications: Secondary | ICD-10-CM

## 2013-06-11 DIAGNOSIS — K2900 Acute gastritis without bleeding: Secondary | ICD-10-CM | POA: Diagnosis present

## 2013-06-11 DIAGNOSIS — R55 Syncope and collapse: Secondary | ICD-10-CM

## 2013-06-11 DIAGNOSIS — G5690 Unspecified mononeuropathy of unspecified upper limb: Secondary | ICD-10-CM | POA: Diagnosis present

## 2013-06-11 DIAGNOSIS — F1010 Alcohol abuse, uncomplicated: Secondary | ICD-10-CM

## 2013-06-11 LAB — CBC
HCT: 48.5 % (ref 39.0–52.0)
HEMATOCRIT: 44.6 % (ref 39.0–52.0)
Hemoglobin: 17.3 g/dL — ABNORMAL HIGH (ref 13.0–17.0)
Hemoglobin: 15.9 g/dL (ref 13.0–17.0)
MCH: 34.7 pg — ABNORMAL HIGH (ref 26.0–34.0)
MCH: 34.8 pg — ABNORMAL HIGH (ref 26.0–34.0)
MCHC: 35.7 g/dL (ref 30.0–36.0)
MCHC: 35.7 g/dL (ref 30.0–36.0)
MCV: 97.2 fL (ref 78.0–100.0)
MCV: 97.6 fL (ref 78.0–100.0)
PLATELETS: 35 10*3/uL — AB (ref 150–400)
Platelets: 37 K/uL — ABNORMAL LOW (ref 150–400)
RBC: 4.99 MIL/uL (ref 4.22–5.81)
RBC: 4.57 MIL/uL (ref 4.22–5.81)
RDW: 14.8 % (ref 11.5–15.5)
RDW: 14.8 % (ref 11.5–15.5)
WBC: 3.3 K/uL — ABNORMAL LOW (ref 4.0–10.5)
WBC: 3.6 10*3/uL — AB (ref 4.0–10.5)

## 2013-06-11 LAB — URINALYSIS, ROUTINE W REFLEX MICROSCOPIC
BILIRUBIN URINE: NEGATIVE
Hgb urine dipstick: NEGATIVE
Ketones, ur: NEGATIVE mg/dL
NITRITE: NEGATIVE
PROTEIN: NEGATIVE mg/dL
Specific Gravity, Urine: 1.029 (ref 1.005–1.030)
Urobilinogen, UA: 4 mg/dL — ABNORMAL HIGH (ref 0.0–1.0)
pH: 6.5 (ref 5.0–8.0)

## 2013-06-11 LAB — COMPREHENSIVE METABOLIC PANEL
ALT: 138 U/L — ABNORMAL HIGH (ref 0–53)
Alkaline Phosphatase: 103 U/L (ref 39–117)
BUN: 3 mg/dL — ABNORMAL LOW (ref 6–23)
CO2: 25 mEq/L (ref 19–32)
Chloride: 102 mEq/L (ref 96–112)
GFR calc Af Amer: >90 mL/min (ref >90)
GFR calc non Af Amer: >90 mL/min (ref >90)
Glucose, Bld: 297 mg/dL — ABNORMAL HIGH (ref 70–99)
Potassium: 3.8 mEq/L (ref 3.7–5.3)
Sodium: 144 mEq/L (ref 137–147)
Total Bilirubin: 3.1 mg/dL — ABNORMAL HIGH (ref 0.3–1.2)

## 2013-06-11 LAB — RAPID URINE DRUG SCREEN, HOSP PERFORMED
Amphetamines: NOT DETECTED
Barbiturates: NOT DETECTED
Benzodiazepines: NOT DETECTED
Cocaine: NOT DETECTED
Opiates: NOT DETECTED
Tetrahydrocannabinol: NOT DETECTED

## 2013-06-11 LAB — URINE MICROSCOPIC-ADD ON

## 2013-06-11 LAB — CREATININE, SERUM
Creatinine, Ser: 0.5 mg/dL (ref 0.50–1.35)
GFR calc non Af Amer: >90 mL/min (ref >90)

## 2013-06-11 LAB — ETHANOL: Alcohol, Ethyl (B): 395 mg/dL — ABNORMAL HIGH (ref 0–11)

## 2013-06-11 LAB — AMMONIA: Ammonia: 89 umol/L — ABNORMAL HIGH (ref 11–60)

## 2013-06-11 LAB — ACETAMINOPHEN LEVEL: Acetaminophen (Tylenol), Serum: <15 ug/mL (ref 10–30)

## 2013-06-11 LAB — TROPONIN I
Troponin I: <0.3 ng/mL (ref <0.30)
Troponin I: <0.3 ng/mL (ref <0.30)

## 2013-06-11 LAB — SALICYLATE LEVEL: Salicylate Lvl: <2 mg/dL — ABNORMAL LOW (ref 2.8–20.0)

## 2013-06-11 LAB — COMPREHENSIVE METABOLIC PANEL WITH GFR
AST: 217 U/L — ABNORMAL HIGH (ref 0–37)
Albumin: 3.4 g/dL — ABNORMAL LOW (ref 3.5–5.2)
Calcium: 8.6 mg/dL (ref 8.4–10.5)
Creatinine, Ser: 0.52 mg/dL (ref 0.50–1.35)
Total Protein: 7.3 g/dL (ref 6.0–8.3)

## 2013-06-11 LAB — I-STAT TROPONIN, ED: Troponin i, poc: 0 ng/mL (ref 0.00–0.08)

## 2013-06-11 LAB — MAGNESIUM: Magnesium: 1.7 mg/dL (ref 1.5–2.5)

## 2013-06-11 LAB — PRO B NATRIURETIC PEPTIDE: Pro B Natriuretic peptide (BNP): <5 pg/mL (ref 0–125)

## 2013-06-11 MED ORDER — SODIUM CHLORIDE 0.9 % IV SOLN
INTRAVENOUS | Status: DC
  Administered 2013-06-12: 16:00:00 via INTRAVENOUS

## 2013-06-11 MED ORDER — PANTOPRAZOLE SODIUM 40 MG IV SOLR
40.0000 mg | Freq: Two times a day (BID) | INTRAVENOUS | Status: DC
  Administered 2013-06-12 (×2): 40 mg via INTRAVENOUS
  Filled 2013-06-11 (×3): qty 40

## 2013-06-11 MED ORDER — LACTULOSE 10 GM/15ML PO SOLN
20.0000 g | Freq: Three times a day (TID) | ORAL | Status: DC
  Administered 2013-06-12: 20 g via ORAL
  Filled 2013-06-11 (×4): qty 30

## 2013-06-11 MED ORDER — DEXTROSE 5 % IV SOLN
2.0000 g | Freq: Three times a day (TID) | INTRAVENOUS | Status: DC
  Administered 2013-06-12 (×2): 2 g via INTRAVENOUS
  Filled 2013-06-11 (×6): qty 2

## 2013-06-11 MED ORDER — RIFAXIMIN 550 MG PO TABS
550.0000 mg | ORAL_TABLET | Freq: Two times a day (BID) | ORAL | Status: DC
  Administered 2013-06-12 (×2): 550 mg via ORAL
  Filled 2013-06-11 (×3): qty 1

## 2013-06-11 MED ORDER — LACTULOSE 10 GM/15ML PO SOLN
30.0000 g | Freq: Once | ORAL | Status: AC
  Administered 2013-06-12: 30 g via ORAL
  Filled 2013-06-11: qty 45

## 2013-06-11 MED ORDER — PROPRANOLOL HCL 10 MG PO TABS
10.0000 mg | ORAL_TABLET | Freq: Three times a day (TID) | ORAL | Status: DC
  Administered 2013-06-12 – 2013-06-16 (×14): 10 mg via ORAL
  Filled 2013-06-11 (×19): qty 1

## 2013-06-11 MED ORDER — HEPARIN SODIUM (PORCINE) 5000 UNIT/ML IJ SOLN: 5000.0000 [IU] | Freq: Three times a day (TID) | INTRAMUSCULAR | Status: DC

## 2013-06-11 MED ORDER — SODIUM CHLORIDE 0.9 % IV BOLUS (SEPSIS)
1000.0000 mL | Freq: Once | INTRAVENOUS | Status: AC
  Administered 2013-06-11: 1000 mL via INTRAVENOUS

## 2013-06-11 MED ORDER — SODIUM CHLORIDE 0.9 % IV SOLN
INTRAVENOUS | Status: DC
  Administered 2013-06-12: 0.8 [IU]/h via INTRAVENOUS
  Filled 2013-06-11: qty 1

## 2013-06-11 MED ORDER — INSULIN REGULAR BOLUS VIA INFUSION
0.0000 [IU] | Freq: Three times a day (TID) | INTRAVENOUS | Status: DC
  Filled 2013-06-11: qty 10

## 2013-06-11 MED ORDER — THIAMINE HCL 100 MG/ML IJ SOLN
100.0000 mg | Freq: Every day | INTRAMUSCULAR | Status: DC
  Filled 2013-06-11 (×5): qty 1

## 2013-06-11 MED ORDER — LORAZEPAM 1 MG PO TABS
0.0000 mg | ORAL_TABLET | Freq: Two times a day (BID) | ORAL | Status: AC
  Administered 2013-06-13 – 2013-06-14 (×2): 1 mg via ORAL
  Filled 2013-06-11 (×2): qty 1

## 2013-06-11 MED ORDER — THIAMINE HCL 100 MG/ML IJ SOLN
Freq: Once | INTRAVENOUS | Status: AC
  Administered 2013-06-12: 02:00:00 via INTRAVENOUS
  Filled 2013-06-11: qty 1000

## 2013-06-11 MED ORDER — GI COCKTAIL ~~LOC~~
30.0000 mL | Freq: Once | ORAL | Status: AC
  Administered 2013-06-11: 30 mL via ORAL
  Filled 2013-06-11: qty 30

## 2013-06-11 MED ORDER — LORAZEPAM 1 MG PO TABS
0.0000 mg | ORAL_TABLET | Freq: Four times a day (QID) | ORAL | Status: AC
Start: 2013-06-11 — End: 2013-06-13
  Administered 2013-06-12: 1 mg via ORAL
  Administered 2013-06-12: 2 mg via ORAL
  Filled 2013-06-11: qty 2
  Filled 2013-06-11: qty 1

## 2013-06-11 MED ORDER — SODIUM CHLORIDE 0.9 % IJ SOLN
3.0000 mL | Freq: Two times a day (BID) | INTRAMUSCULAR | Status: DC
  Administered 2013-06-12 – 2013-06-17 (×9): 3 mL via INTRAVENOUS

## 2013-06-11 MED ORDER — ONDANSETRON HCL 4 MG/2ML IJ SOLN
4.0000 mg | Freq: Once | INTRAMUSCULAR | Status: AC
  Administered 2013-06-11: 4 mg via INTRAVENOUS
  Filled 2013-06-11: qty 2

## 2013-06-11 MED ORDER — DEXTROSE 50 % IV SOLN: 25.0000 mL | INTRAVENOUS | Status: DC | PRN

## 2013-06-11 MED ORDER — VITAMIN B-1 100 MG PO TABS
100.0000 mg | ORAL_TABLET | Freq: Every day | ORAL | Status: DC
  Administered 2013-06-13 – 2013-06-17 (×6): 100 mg via ORAL
  Filled 2013-06-11 (×5): qty 1

## 2013-06-11 MED ORDER — DEXTROSE-NACL 5-0.45 % IV SOLN
INTRAVENOUS | Status: DC
  Administered 2013-06-12: 02:00:00 via INTRAVENOUS

## 2013-06-11 MED ORDER — INSULIN ASPART 100 UNIT/ML ~~LOC~~ SOLN: 0.0000 [IU] | SUBCUTANEOUS | Status: DC

## 2013-06-11 NOTE — Consult Note (Addendum)
Neurology Consultation Reason for Consult: Left arm weakness Referring Physician: Leander RamsGhim, M  CC: left arm weakness  History is obtained from:patient  HPI: [name] BearDavid [name] is a 48 y.o. male with a history of alcoholism, cirrhosis, diabetes who presents with multiple complaints including chest pain, swelling, syncopal episodes, nausea, vomiting. He has a history of withdrawal seizures. He is currently intoxicated.    He states for the past 8 days he has had left arm numbness and weakness. It involves the entirety of the arm, and does not involve the face or leg.  He states that he is getting intermittent chest pain with numbness of his left arm when the chest pain comes on.   ROS: limited by intoxication and language barrier  Past Medical History  Diagnosis Date  . Shortness of breath   . Sleep apnea   . Diabetes mellitus without complication   . GERD (gastroesophageal reflux disease)   . H/O hiatal hernia   . Seizures   . Headache(784.0)   . No pertinent past medical history     Family History: No history of stroke  Social History: Tob: former smoker Current heavy drinker  Exam: Current vital signs: BP 130/65  Pulse 99  Temp(Src) 98.7 F (37.1 C) (Oral)  Resp 18  SpO2 93% Vital signs in last 24 hours: Temp:  [98.5 F (36.9 C)-98.7 F (37.1 C)] 98.7 F (37.1 C) (04/06 2002) Pulse Rate:  [96-103] 99 (04/06 2200) Resp:  [16-22] 18 (04/06 2200) BP: (130-157)/(65-82) 130/65 mmHg (04/06 2200) SpO2:  [91 %-98 %] 93 % (04/06 2200)  General: in bed, NAD CV: regular rate and rhythm Mental Status: Patient is awake, alert, intoxicated but oriented. He is able to follow commands and answer questions. No signs of aphasia or neglect Cranial Nerves: II: Visual Fields are full. Pupils are equal, round, and reactive to light.  Discs are difficult to visualize. III,IV, VI: EOMI without ptosis or diploplia.  V: Facial sensation is symmetric to temperature VII: Facial movement  is symmetric.  VIII: hearing is intact to voice X: Uvula elevates symmetrically XI: Shoulder shrug is symmetric. XII: tongue is midline without atrophy or fasciculations.  Motor: Tone is normal. Bulk is normal. 5/5 strength was present in bilateral legs and right arm. 4/5 weakness in the left arm. Asterixis is present.  Sensory: Sensation is decreased to pin throughout the left arm. Deep Tendon Reflexes: 2+ and symmetric in the biceps and patellae. He has intact biceps, brachioradialis, triceps of the left arm. Cerebellar: FNF with tremor bilaterally Gait: Not tested due to intoxication   I have reviewed labs in epic and the results pertinent to this consultation are: Ethanol 395 CBC with from cytopenia  I have reviewed the images obtained:CT head-negative  Impression: 48 year old male with weakness of his left arm for 8 days. Stroke is possible, but it is not clear to me that this is the case without symptoms in the face or leg. C-spine disease could also contribute. It does not clearly fit a peripheral distribution, but if the MRI is negative and symptoms persists, an EMG may be helpful as an outpatient.  Recommendations: 1) MRI brain and C-spine.  2) further recommendations following above imaging.    Ritta SlotMcNeill Shawnae Leiva, MD [facility] [phone]  If 7pm- 7am, please page neurology on call as listed in AMION.

## 2013-06-11 NOTE — ED Provider Notes (Signed)
CSN: [id number]     Arrival date & time 06/11/13  1447 History   First MD Initiated Contact with Patient 06/11/13 1800     Chief Complaint  Patient presents with  . Alcohol Intoxication  . Medical Clearance     (Consider location/radiation/quality/duration/timing/severity/associated sxs/prior Treatment) HPI [name] [name] is a 48 y.o. male who presents to emergency department with multiple complaints. History is translated by patient's family. Patient is intoxicated and is not a good historian. Cording to the family, patient has had increased alcohol intake over last month. He has had dizziness, syncopal episodes, nausea, vomiting, confusion that has been worse over the last 2 weeks. Patient has also had swelling in his upper and lower extremities. He has had lower back pain. He also reports left hand weakness for the last week, twitching in extremities that's intermittent, palpitations. States also intermittent chest pain that he describes as "burning" that is non exertional. Patient admits to drinking both liquor and beer. According to the family, when patient does not drink for more than a day, he begins to have shaking and generalized malaise. Patient did have a seizure a year ago. Patient requesting detox. Denies  SI or HI.   Past Medical History  Diagnosis Date  . Shortness of breath   . Sleep apnea   . Diabetes mellitus without complication   . GERD (gastroesophageal reflux disease)   . H/O hiatal hernia   . Seizures   . Headache(784.0)   . No pertinent past medical history    Past Surgical History  Procedure Laterality Date  . Appendectomy    . No past surgeries    . Radiology with anesthesia  01/30/2012    Procedure: RADIOLOGY WITH ANESTHESIA;  Surgeon: Casimiro Needle T. Miles Costain, MD;  Location: [facility] OR;  Service: Radiology;  Laterality: N/A;   History reviewed. No pertinent family history. History  Substance Use Topics  . Smoking status: Former Smoker -- 0.25 packs/day for 10 years     Types: Cigarettes    Quit date: 09/30/2007  . Smokeless tobacco: Never Used  . Alcohol Use: No     Comment: quit a year ago    Review of Systems  Constitutional: Negative for fever and chills.  Eyes: Negative for visual disturbance.  Respiratory: Negative for cough, chest tightness and shortness of breath.   Cardiovascular: Negative for chest pain, palpitations and leg swelling.  Gastrointestinal: Positive for nausea, vomiting and abdominal pain. Negative for diarrhea and abdominal distention.  Genitourinary: Negative for dysuria, urgency, frequency and hematuria.  Musculoskeletal: Negative for arthralgias, myalgias, neck pain and neck stiffness.  Skin: Negative for rash.  Allergic/Immunologic: Negative for immunocompromised state.  Neurological: Positive for dizziness, syncope, weakness, light-headedness and headaches. Negative for numbness.      Allergies  Review of patient's allergies indicates no known allergies.  Home Medications   Current Outpatient Rx  Name  Route  Sig  Dispense  Refill  . furosemide (LASIX) 40 MG tablet   Oral   Take 1 tablet (40 mg total) by mouth daily.   15 tablet   0    BP 152/82  Pulse 103  Temp(Src) 98.5 F (36.9 C) (Oral)  Resp 16  SpO2 92% Physical Exam  Nursing note and vitals reviewed. Constitutional: He appears well-developed and well-nourished.  Pt is intoxicated  HENT:  Head: Normocephalic and atraumatic.  Eyes: Conjunctivae and EOM are normal. Pupils are equal, round, and reactive to light.  Neck: Normal range of motion. Neck supple.  Cardiovascular:  Normal rate, regular rhythm and normal heart sounds.   Pulmonary/Chest: Effort normal. No respiratory distress. He has no wheezes. He has no rales. He exhibits no tenderness.  Abdominal: Soft. Bowel sounds are normal. He exhibits no distension. There is tenderness. There is no rebound.  Diffuse tenderness  Musculoskeletal: He exhibits no edema.  Neurological: He is alert.   Unable to perform a good neurological exam at this time. Pt is able to move all extremities. Grip strength slightly weaker on left side  Skin: Skin is warm and dry.    ED Course  Procedures (including critical care time) Labs Review Labs Reviewed  CBC - Abnormal; Notable for the following:    WBC 3.3 (*)    Hemoglobin 17.3 (*)    MCH 34.7 (*)    Platelets 37 (*)    All other components within normal limits  COMPREHENSIVE METABOLIC PANEL - Abnormal; Notable for the following:    Glucose, Bld 297 (*)    BUN 3 (*)    Albumin 3.4 (*)    AST 217 (*)    ALT 138 (*)    Total Bilirubin 3.1 (*)    All other components within normal limits  ETHANOL - Abnormal; Notable for the following:    Alcohol, Ethyl (B) 395 (*)    All other components within normal limits  SALICYLATE LEVEL - Abnormal; Notable for the following:    Salicylate Lvl <2.0 (*)    All other components within normal limits  URINALYSIS, ROUTINE W REFLEX MICROSCOPIC - Abnormal; Notable for the following:    Color, Urine ORANGE (*)    Glucose, UA >1000 (*)    Urobilinogen, UA 4.0 (*)    Leukocytes, UA TRACE (*)    All other components within normal limits  AMMONIA - Abnormal; Notable for the following:    Ammonia 89 (*)    All other components within normal limits  URINE MICROSCOPIC-ADD ON - Abnormal; Notable for the following:    Casts HYALINE CASTS (*)    Crystals CA OXALATE CRYSTALS (*)    All other components within normal limits  CBC - Abnormal; Notable for the following:    WBC 3.6 (*)    MCH 34.8 (*)    Platelets 35 (*)    All other components within normal limits  URINE CULTURE  URINE CULTURE  CULTURE, BLOOD (ROUTINE X 2)  CULTURE, BLOOD (ROUTINE X 2)  ACETAMINOPHEN LEVEL  URINE RAPID DRUG SCREEN (HOSP PERFORMED)  MAGNESIUM  TROPONIN I  CREATININE, SERUM  TROPONIN I  PRO B NATRIURETIC PEPTIDE  COMPREHENSIVE METABOLIC PANEL  CBC WITH DIFFERENTIAL  TROPONIN I  TROPONIN I  HEMOGLOBIN A1C  VITAMIN  B12  VITAMIN B1  HIV ANTIBODY (ROUTINE TESTING)  I-STAT TROPOININ, ED   Imaging Review Dg Chest 2 View  06/11/2013   CLINICAL DATA:  Alcohol intoxication.  Medical clinic.  EXAM: CHEST  2 VIEW  COMPARISON:  05/24/2013  FINDINGS: Cardiac silhouette is normal in size. Normal mediastinal and hilar contours. Lung volumes are low. There is also mild respiratory motion. Allowing for this, the lungs are clear. No pleural effusion or pneumothorax.  Bony thorax is intact.  IMPRESSION: No active cardiopulmonary disease.   Electronically Signed   By: Amie Portland M.D.   On: 06/11/2013 21:23   Ct Head Wo Contrast  06/11/2013   CLINICAL DATA:  Status post fall  EXAM: CT HEAD WITHOUT CONTRAST  TECHNIQUE: Contiguous axial images were obtained from the base of the  skull through the vertex without intravenous contrast.  COMPARISON:  Noncontrast CT scan of the brain dated January 27, 2012.  FINDINGS: The ventricles are normal in size and position. There is no intracranial hemorrhage nor intracranial mass effect. There is no evidence of an evolving ischemic infarction. The cerebellum and brainstem are normal in density. There are no abnormal intracranial calcifications.  At bone window settings there is no evidence of an acute skull fracture. The observed portions of the paranasal sinuses and mastoid air cells are clear.  IMPRESSION: There is no acute intracranial abnormality.   Electronically Signed   By: Jacquel  [place]   On: 06/11/2013 20:13   Mr Brain Wo Contrast  06/12/2013   CLINICAL DATA:  ALCOHOL INTOXICATION MEDICAL CLEARANCE  EXAM: MRI HEAD WITHOUT CONTRAST  TECHNIQUE: Multiplanar, multiecho pulse sequences of the brain and surrounding structures were obtained without intravenous contrast.  COMPARISON:  CT HEAD W/O CM dated 06/11/2013; MR HEAD WO/W CM dated 10/21/2012  FINDINGS: Moderately motion degraded examination. The ventricles and sulci are normal for patient's age. No abnormal parenchymal signal, mass lesions,  mass effect. No reduced diffusion to suggest acute ischemia. No susceptibility artifact to suggest hemorrhage. Again noted is T1 shortening of the basal ganglia and to lesser extent thalamus which can be seen with chronic liver disease, similar.  No abnormal extra-axial fluid collections. No extra-axial masses though, contrast enhanced sequences would be more sensitive. Normal major intracranial vascular flow voids seen at the skull base.  Ocular globes and orbital contents are unremarkable though not tailored for evaluation. No abnormal sellar expansion. Visualized paranasal sinuses and mastoid air cells are well-aerated. No suspicious calvarial bone marrow signal. No abnormal sellar expansion. Craniocervical junction maintained.  IMPRESSION: No acute intracranial process on this moderately motion degraded examination.   Electronically Signed   By: Awilda Metro   On: 06/12/2013 00:10     EKG Interpretation None      MDM   Final diagnoses:  Left arm weakness    Patient presented to emergency department   multiple different complaints. Complaints included alcohol abuse, abdominal pain, nausea, vomiting, chest pain, left arm numbness. he reports multiple syncopal episodes, according to the family possible seizures. Patient's vital signs are normal upon arrival. He appears to be very intoxicated and confused. Will get labs, CT head, will administer IV fluids and monitor.    Patient's labs showed elevated LFTs, higher than patient's baseline. He also has a low platelets which are below his baseline is well. 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 His blood sugars are elevated at 297, discussed with patient, he does not take any medications for it diabetes right now. His bilirubin is 3. According to the family patient has been binge drinking for the last month. His alcohol level history 95 which could explain his confusion and "syncopal" episodes. CT head did not show any acute findings. Chest  x-ray is negative. Patient continues to be confused, ammonia level added. It is slightly elevated and I have ordered him lactulose. At this time I cannot medically clear him for psychiatric consult given his abnormal labs, intermittent hypoxia, possible syncopal episodes a home call possible seizures, and left hand numbness and weakness. Will admit for further evaluation. He will protocol started.   Spoke with triad will admit, Dr. Amada Jupiter consult is regarding left grip weakness.   Filed Vitals:   06/11/13 1820 06/11/13 1845 06/11/13 2002 06/11/13 2200  BP: 152/82 157/78  130/65  Pulse: 103 98  99  Temp:   98.7 F (37.1 C)   TempSrc:      Resp: 16 22  18   SpO2: 92% 91%  93%     Lottie Musselatyana A Jenipher Havel, PA-C 06/12/13 0057

## 2013-06-11 NOTE — ED Notes (Signed)
Patient transported to CT 

## 2013-06-11 NOTE — H&P (Addendum)
Hospitalist Admission History and Physical  Patient name: [name] [name] Medical record number: [id number] Date of birth: 1966-01-16 Age: 48 y.o. Gender: male  Primary Care Provider: Jeanann LewandowskyJEGEDE, OLUGBEMIGA, MD  Chief Complaint: encephalopathy,ETOH toxicity   History of Present Illness:This is a 48 y.o. year old male with prior hx/o ETOH abuse, alcoholic cirrhosis, secondary thrombocytopenia, type 2 diabetes, esophageal varices presented with alcohol toxicity and encephalopathy. Level V Caveat as patient is acutely encephalopathic. No family at bedside to translate. Moderate level of is English-speaking. Per report, patient is at increased alcohol intake over the past 2 weeks with multiple complaints including dizziness, syncopal episodes, vomiting, confusion. Patient states he's had some chest discomfort, left arm numbness, abdominal pain. Patient says he fell on his head earlier today. Has had some posterior headache. Pt also reports R arm/hand numbness/paresthesias over past week. Mainly in hand. Denies paresthesias elsewhere    In the ER, patient is lethargic. Notable lab findings include alcohol level 395, glucose is 297-AG 17, AST 217, ALT 138, T. bili of 3.1. Ammonia level 89. Plt 37. Urinalysis with greater than thousand glucose and trace leukocytes. Troponin EKG within normal limits. Hemodynamic stable, though patient is noted to be transiently hypoxic and apneic. UDS WNL.  Head CT and CXR WNL thus far.   Patient Active Problem List   Diagnosis Date Noted  . Encephalopathy 06/11/2013  . Liver cirrhosis, alcoholic 11/02/2012  . DM (diabetes mellitus) type 2, uncontrolled, with ketoacidosis 11/02/2012  . Pain in joint, lower leg 11/02/2012  . Syncopal episodes 10/21/2012  . Abdominal pain 10/21/2012  . Alcohol abuse 01/29/2012  . Alcoholic cirrhosis 01/29/2012  . Thrombocytopenia, secondary 01/29/2012  . DM type 2 (diabetes mellitus, type 2) 01/29/2012  . Esophageal varices in alcoholic  cirrhosis 01/29/2012  . History of GI bleed 01/29/2012   Past Medical History: Past Medical History  Diagnosis Date  . Shortness of breath   . Sleep apnea   . Diabetes mellitus without complication   . GERD (gastroesophageal reflux disease)   . H/O hiatal hernia   . Seizures   . Headache(784.0)   . No pertinent past medical history     Past Surgical History: Past Surgical History  Procedure Laterality Date  . Appendectomy    . No past surgeries    . Radiology with anesthesia  01/30/2012    Procedure: RADIOLOGY WITH ANESTHESIA;  Surgeon: Casimiro NeedleMichael T. Miles CostainShick, MD;  Location: [facility] OR;  Service: Radiology;  Laterality: N/A;    Social History: History   Social History  . Marital Status: Single    Spouse Name: N/A    Number of Children: N/A  . Years of Education: N/A   Social History Main Topics  . Smoking status: Former Smoker -- 0.25 packs/day for 10 years    Types: Cigarettes    Quit date: 09/30/2007  . Smokeless tobacco: Never Used  . Alcohol Use: No     Comment: quit a year ago  . Drug Use: No  . Sexual Activity: Not Currently   Other Topics Concern  . None   Social History Narrative  . None    Family History: History reviewed. No pertinent family history.  Allergies: No Known Allergies  Current Facility-Administered Medications  Medication Dose Route Frequency Provider Last Rate Last Dose  . heparin injection 5,000 Units  5,000 Units Subcutaneous 3 times per day Doree AlbeeSteven Faithlyn Recktenwald, MD      . Melene Muller[START ON 06/12/2013] insulin aspart (novoLOG) injection 0-9 Units  0-9 Units Subcutaneous 6  times per day Doree Albee, MD      . lactulose [facility]) 10 GM/15ML solution 20 g  20 g Oral TID Doree Albee, MD      . lactulose (CHRONULAC) 10 GM/15ML solution 30 g  30 g Oral Once Tatyana A Kirichenko, PA-C      . LORazepam (ATIVAN) tablet 0-4 mg  0-4 mg Oral 4 times per day Tatyana A Kirichenko, PA-C       Followed by  . [START ON 06/13/2013] LORazepam (ATIVAN) tablet 0-4 mg   0-4 mg Oral Q12H Tatyana A Kirichenko, PA-C      . propranolol (INDERAL) tablet 10 mg  10 mg Oral TID Doree Albee, MD      . rifaximin [facility]) tablet 550 mg  550 mg Oral BID Doree Albee, MD      . sodium chloride 0.9 % injection 3 mL  3 mL Intravenous Q12H Doree Albee, MD      . thiamine (VITAMIN B-1) tablet 100 mg  100 mg Oral Daily Tatyana A Kirichenko, PA-C       Or  . thiamine (B-1) injection 100 mg  100 mg Intravenous Daily Tatyana A Kirichenko, PA-C       Current Outpatient Prescriptions  Medication Sig Dispense Refill  . furosemide (LASIX) 40 MG tablet Take 1 tablet (40 mg total) by mouth daily.  15 tablet  0   Review Of Systems: 12 point ROS negative except as noted above in HPI.  Physical Exam: Filed Vitals:   06/11/13 2002  BP:   Pulse:   Temp: 98.7 F (37.1 C)  Resp:     General: mildly responsive, lethargic  HEENT: PERRLA and extra ocular movement intact Heart: S1, S2 normal, no murmur, rub or gallop, regular rate and rhythm Lungs: clear to auscultation, no wheezes or rales and unlabored breathing, positive chest wall tenderness to palpation. Abdomen: + abd distension, + generalized abd TTP Extremities: trace edema bilaterally  Skin:no rashes, no ecchymoses Neurology: +asterixis, otherwise minimally cooperative to exam   Labs and Imaging: Lab Results  Component Value Date/Time   NA 144 06/11/2013  3:47 PM   K 3.8 06/11/2013  3:47 PM   CL 102 06/11/2013  3:47 PM   CO2 25 06/11/2013  3:47 PM   BUN 3* 06/11/2013  3:47 PM   CREATININE 0.52 06/11/2013  3:47 PM   CREATININE 0.48* 11/16/2012  2:43 PM   GLUCOSE 297* 06/11/2013  3:47 PM   Lab Results  Component Value Date   WBC 3.3* 06/11/2013   HGB 17.3* 06/11/2013   HCT 48.5 06/11/2013   MCV 97.2 06/11/2013   PLT 37* 06/11/2013    Dg Chest 2 View  06/11/2013   CLINICAL DATA:  Alcohol intoxication.  Medical clinic.  EXAM: CHEST  2 VIEW  COMPARISON:  05/24/2013  FINDINGS: Cardiac silhouette is normal in size. Normal  mediastinal and hilar contours. Lung volumes are low. There is also mild respiratory motion. Allowing for this, the lungs are clear. No pleural effusion or pneumothorax.  Bony thorax is intact.  IMPRESSION: No active cardiopulmonary disease.   Electronically Signed   By: Amie Portland M.D.   On: 06/11/2013 21:23   Ct Head Wo Contrast  06/11/2013   CLINICAL DATA:  Status post fall  EXAM: CT HEAD WITHOUT CONTRAST  TECHNIQUE: Contiguous axial images were obtained from the base of the skull through the vertex without intravenous contrast.  COMPARISON:  Noncontrast CT scan of the brain dated January 27, 2012.  FINDINGS: The ventricles are normal in size and position. There is no intracranial hemorrhage nor intracranial mass effect. There is no evidence of an evolving ischemic infarction. The cerebellum and brainstem are normal in density. There are no abnormal intracranial calcifications.  At bone window settings there is no evidence of an acute skull fracture. The observed portions of the paranasal sinuses and mastoid air cells are clear.  IMPRESSION: There is no acute intracranial abnormality.   Electronically Signed   By: Nigel  [place]   On: 06/11/2013 20:13     Assessment and Plan: Zakariyah Freimark is a 48 y.o. year old male presenting with encephalopathy, ETOH intoxication   Neuro/Encephalopathy: Likely secondary to alcohol toxicity. Start on lactulose and rifaximin. Start on CIWA protocol. Banana bag x 1. Suspect concomitant left arm paresthesias are likely secondary to peripheral neuropathy versus vitamin deficiency. Check vit b1 and b12. MRI brain and neuro consult pending. CVA workup if indicative.   GI: baseline alcohol cirrhosis and esophageal varices. Start on propranolol. Noted abdominal pain. Check abdominal ultrasound to eval for ascites. Cefotax for SBP treatment. Should also give genitourinary coverage. Currently afebrile and without leukocytosis. No active bleeding. Check Hemoccult. High-dose  PPI. Avoid NSAIDs. Consider addition of aldosterone antagonist. Mildly dry on exam. Hold diuretic in the interim. NPO. ? GI referral.   Endocrine: Mild diabetic ketosis in the setting of acute alcohol toxicity. Anion gap of 17. Gently hydrate. Commander. A1c.  Cardiovascular: Nonspecific chest pain. Palpable tenderness to palpation across anterior chest wall. We'll cycle cardiac enzymes. Troponins and EKG within normal limits thus far. Hold on aspirin given high risk for GI bleeding. Hemoccult pending. Telemetry bed.  Infectious disease: Some concern for spontaneous bacteria peritonitis. Next clinical picture. We'll place on IV cefotaxime for GI and genitourinary prophylaxis. Urine culture. Patient is transiently hypoxic in setting of encephalopathy. High-risk for aspiration pneumonia a clear chest x-ray tonight. Supplemental oxygen. Repeat chest x-ray in the morning. Abdominal ultrasound pending. Blood cultures.   Heme:  Secondary thrombocytopenia and the setting of alcoholic cirrhosis. We'll continue to trend.  Prophylaxis:  SCDs.  Disposition: pending further evaluation  Code Status:Full code        Doree Albee MD  Pager: [phone]

## 2013-06-11 NOTE — ED Notes (Addendum)
Pt reports that he has a hx of alcohol abuse, reports that he had surgery on his liver about 2 years ago. Reports that he has been drinking again. Wants detox from alcohol at this time. Family present with him at triage. Denies any SI/HI at this time.

## 2013-06-12 ENCOUNTER — Inpatient Hospital Stay (HOSPITAL_COMMUNITY): Payer: 59

## 2013-06-12 ENCOUNTER — Observation Stay (HOSPITAL_COMMUNITY): Payer: 59

## 2013-06-12 DIAGNOSIS — R29898 Other symptoms and signs involving the musculoskeletal system: Secondary | ICD-10-CM

## 2013-06-12 DIAGNOSIS — Z9049 Acquired absence of other specified parts of digestive tract: Secondary | ICD-10-CM

## 2013-06-12 DIAGNOSIS — R161 Splenomegaly, not elsewhere classified: Secondary | ICD-10-CM | POA: Diagnosis present

## 2013-06-12 LAB — CBC WITH DIFFERENTIAL/PLATELET
BASOS PCT: 1 % (ref 0–1)
Basophils Absolute: 0 10*3/uL (ref 0.0–0.1)
EOS ABS: 0.1 10*3/uL (ref 0.0–0.7)
Eosinophils Relative: 1 % (ref 0–5)
HEMATOCRIT: 42.8 % (ref 39.0–52.0)
HEMOGLOBIN: 15 g/dL (ref 13.0–17.0)
Lymphocytes Relative: 19 % (ref 12–46)
Lymphs Abs: 0.7 10*3/uL (ref 0.7–4.0)
MCH: 34.4 pg — AB (ref 26.0–34.0)
MCHC: 35 g/dL (ref 30.0–36.0)
MCV: 98.2 fL (ref 78.0–100.0)
MONOS PCT: 10 % (ref 3–12)
Monocytes Absolute: 0.4 10*3/uL (ref 0.1–1.0)
Neutro Abs: 2.6 10*3/uL (ref 1.7–7.7)
Neutrophils Relative %: 69 % (ref 43–77)
Platelets: 30 10*3/uL — ABNORMAL LOW (ref 150–400)
RBC: 4.36 MIL/uL (ref 4.22–5.81)
RDW: 14.8 % (ref 11.5–15.5)
WBC: 3.8 10*3/uL — ABNORMAL LOW (ref 4.0–10.5)

## 2013-06-12 LAB — COMPREHENSIVE METABOLIC PANEL
ALT: 120 U/L — AB (ref 0–53)
AST: 194 U/L — AB (ref 0–37)
Albumin: 2.9 g/dL — ABNORMAL LOW (ref 3.5–5.2)
Alkaline Phosphatase: 83 U/L (ref 39–117)
BUN: 4 mg/dL — ABNORMAL LOW (ref 6–23)
CALCIUM: 7.7 mg/dL — AB (ref 8.4–10.5)
CO2: 24 mEq/L (ref 19–32)
Chloride: 106 mEq/L (ref 96–112)
Creatinine, Ser: 0.5 mg/dL (ref 0.50–1.35)
GFR calc Af Amer: >90 mL/min (ref >90)
GFR calc non Af Amer: >90 mL/min (ref >90)
Glucose, Bld: 170 mg/dL — ABNORMAL HIGH (ref 70–99)
Potassium: 3.7 mEq/L (ref 3.7–5.3)
SODIUM: 144 meq/L (ref 137–147)
TOTAL PROTEIN: 6.1 g/dL (ref 6.0–8.3)
Total Bilirubin: 2.5 mg/dL — ABNORMAL HIGH (ref 0.3–1.2)

## 2013-06-12 LAB — CBG MONITORING, ED
Glucose-Capillary: 144 mg/dL — ABNORMAL HIGH (ref 70–99)
Glucose-Capillary: 161 mg/dL — ABNORMAL HIGH (ref 70–99)
Glucose-Capillary: 163 mg/dL — ABNORMAL HIGH (ref 70–99)
Glucose-Capillary: 157 mg/dL — ABNORMAL HIGH (ref 70–99)
Glucose-Capillary: 143 mg/dL — ABNORMAL HIGH (ref 70–99)
Glucose-Capillary: 129 mg/dL — ABNORMAL HIGH (ref 70–99)
Glucose-Capillary: 143 mg/dL — ABNORMAL HIGH (ref 70–99)
Glucose-Capillary: 134 mg/dL — ABNORMAL HIGH (ref 70–99)

## 2013-06-12 LAB — GLUCOSE, CAPILLARY
GLUCOSE-CAPILLARY: 115 mg/dL — AB (ref 70–99)
Glucose-Capillary: 100 mg/dL — ABNORMAL HIGH (ref 70–99)
Glucose-Capillary: 128 mg/dL — ABNORMAL HIGH (ref 70–99)
Glucose-Capillary: 144 mg/dL — ABNORMAL HIGH (ref 70–99)
Glucose-Capillary: 190 mg/dL — ABNORMAL HIGH (ref 70–99)

## 2013-06-12 LAB — TROPONIN I: Troponin I: <0.3 ng/mL (ref <0.30)

## 2013-06-12 LAB — MRSA PCR SCREENING: MRSA BY PCR: NEGATIVE

## 2013-06-12 LAB — HEMOGLOBIN A1C
Hgb A1c MFr Bld: 7 % — ABNORMAL HIGH (ref <5.7)
MEAN PLASMA GLUCOSE: 154 mg/dL — AB (ref <117)

## 2013-06-12 LAB — VITAMIN B12: Vitamin B-12: 1446 pg/mL — ABNORMAL HIGH (ref 211–911)

## 2013-06-12 MED ORDER — PANTOPRAZOLE SODIUM 40 MG PO TBEC
40.0000 mg | DELAYED_RELEASE_TABLET | Freq: Every day | ORAL | Status: DC
  Administered 2013-06-13 – 2013-06-17 (×5): 40 mg via ORAL
  Filled 2013-06-12 (×3): qty 1

## 2013-06-12 MED ORDER — INSULIN ASPART 100 UNIT/ML ~~LOC~~ SOLN: 0.0000 [IU] | Freq: Every day | SUBCUTANEOUS | Status: DC

## 2013-06-12 MED ORDER — ONDANSETRON HCL 4 MG/2ML IJ SOLN
4.0000 mg | Freq: Four times a day (QID) | INTRAMUSCULAR | Status: DC | PRN
  Administered 2013-06-13: 4 mg via INTRAVENOUS
  Filled 2013-06-12: qty 2

## 2013-06-12 MED ORDER — OXYCODONE HCL 5 MG PO TABS
5.0000 mg | ORAL_TABLET | Freq: Four times a day (QID) | ORAL | Status: DC | PRN
  Administered 2013-06-12: 5 mg via ORAL
  Filled 2013-06-12: qty 1

## 2013-06-12 MED ORDER — INSULIN ASPART 100 UNIT/ML ~~LOC~~ SOLN
0.0000 [IU] | Freq: Three times a day (TID) | SUBCUTANEOUS | Status: DC
  Administered 2013-06-12 – 2013-06-13 (×3): 2 [IU] via SUBCUTANEOUS
  Administered 2013-06-14: 1 [IU] via SUBCUTANEOUS
  Administered 2013-06-14 – 2013-06-15 (×4): 2 [IU] via SUBCUTANEOUS
  Administered 2013-06-16: 3 [IU] via SUBCUTANEOUS
  Administered 2013-06-16: 2 [IU] via SUBCUTANEOUS
  Administered 2013-06-16: 1 [IU] via SUBCUTANEOUS
  Administered 2013-06-17: 3 [IU] via SUBCUTANEOUS

## 2013-06-12 MED ORDER — ACETAMINOPHEN 325 MG PO TABS
650.0000 mg | ORAL_TABLET | Freq: Four times a day (QID) | ORAL | Status: DC | PRN
  Administered 2013-06-12 – 2013-06-16 (×8): 650 mg via ORAL
  Filled 2013-06-12 (×9): qty 2

## 2013-06-12 MED ORDER — PANTOPRAZOLE SODIUM 40 MG IV SOLR
40.0000 mg | INTRAVENOUS | Status: DC
Start: 2013-06-13 — End: 2013-06-12

## 2013-06-12 NOTE — ED Notes (Signed)
Called MRI staff to inquire why pt did not have CT of spine.  Their notes reflect that pt refused MRI.  Pt did have MRI of head.

## 2013-06-12 NOTE — Progress Notes (Signed)
PHARMACY CONSULT - MEDICATION HISTORY  I spoke with the patient through a translator. He states he has not taken anything in a month. He reports taking no medications for diabetes. When asked [facility] [place] and a [facility] on [address] which I cannot find.  I called [facility] and they have no record for him. I called [facility] and he has not filled anything since 2013 - last thing filled was lactulose. [facility] states he had a spironolactone 50 mg daily prescription last filled 10/2012 and an omeprazole that was never picked-up.  FollansbeeJennifer [place], [address] BoulevardPh[address].D., BCPS Clinical Pharmacist Pager: [phone] 06/12/2013 5:24 PM

## 2013-06-12 NOTE — ED Notes (Signed)
Patient to MRI via bed.

## 2013-06-12 NOTE — Progress Notes (Signed)
UR completed. Patient changed to inpatient- requiring IV antibiotics.  

## 2013-06-12 NOTE — ED Notes (Signed)
Oxygen applied via [place] at 3l/min due to sats dropping

## 2013-06-12 NOTE — Progress Notes (Signed)
NEURO HOSPITALIST PROGRESS NOTE   SUBJECTIVE:                                                                                                                        Patient still complaining of left arm weakness but no pain.  He does no t speak english and he was having difficult time explaining what his weakness was but does state his left hand felt "tingly"--the whole hand.  Initially refusing the MRI C-spine.   OBJECTIVE:                                                                                                                           Vital signs in last 24 hours: Temp:  [98.5 F (36.9 C)-98.7 F (37.1 C)] 98.7 F (37.1 C) (04/06 2002) Pulse Rate:  [88-110] 88 (04/07 0430) Resp:  [16-22] 21 (04/07 0430) BP: (93-157)/(45-82) 100/46 mmHg (04/07 0430) SpO2:  [90 %-98 %] 91 % (04/07 0430)  Intake/Output from previous day: 04/06 0701 - 04/07 0700 In: -  Out: 300 [Urine:300] Intake/Output this shift:   Nutritional status: NPO  Past Medical History  Diagnosis Date  . Shortness of breath   . Sleep apnea   . Diabetes mellitus without complication   . GERD (gastroesophageal reflux disease)   . H/O hiatal hernia   . Seizures   . Headache(784.0)   . No pertinent past medical history      Neurologic Exam:  Mental Status: Alert, oriented, thought content appropriate.  Speech fluent without evidence of aphasia.  Able to follow 3 step commands without difficulty. Cranial Nerves: II: Discs flat bilaterally; Visual fields grossly normal, pupils equal, round, reactive to light and accommodation III,IV, VI: ptosis not present, extra-ocular motions intact bilaterally V,VII: smile symmetric, facial light touch sensation normal bilaterally VIII: hearing normal bilaterally IX,X: gag reflex present XI: bilateral shoulder shrug XII: midline tongue extension without atrophy or fasciculations  Motor: Right : Upper extremity   5/5    Left:      Upper extremity   4/5  Lower extremity   5/5     Lower extremity   5/5 Tone and bulk:normal tone throughout; no atrophy noted Sensory: Pinprick and light touch intact throughout, bilaterally Deep  Tendon Reflexes:  Right: Upper Extremity   Left: Upper extremity   biceps (C-5 to C-6) 2/4   biceps (C-5 to C-6) 2/4 tricep (C7) 2/4    triceps (C7) 2/4 Brachioradialis (C6) 2/4  Brachioradialis (C6) 2/4  Lower Extremity Lower Extremity  quadriceps (L-2 to L-4) 2/4   quadriceps (L-2 to L-4) 2/4 Achilles (S1) 2/4   Achilles (S1) 2/4  Plantars: Right: downgoing   Left: downgoing   Lab Results: Basic Metabolic Panel:  Recent Labs Lab 06/11/13 1547 06/11/13 2245 06/12/13 0405  NA 144  --  144  K 3.8  --  3.7  CL 102  --  106  CO2 25  --  24  GLUCOSE 297*  --  170*  BUN 3*  --  4*  CREATININE 0.52 0.50 0.50  CALCIUM 8.6  --  7.7*  MG  --  1.7  --     Liver Function Tests:  Recent Labs Lab 06/11/13 1547 06/12/13 0405  AST 217* 194*  ALT 138* 120*  ALKPHOS 103 83  BILITOT 3.1* 2.5*  PROT 7.3 6.1  ALBUMIN 3.4* 2.9*   No results found for this basename: LIPASE, AMYLASE,  in the last 168 hours  Recent Labs Lab 06/11/13 1910  AMMONIA 89*    CBC:  Recent Labs Lab 06/11/13 1547 06/11/13 2245 06/12/13 0405  WBC 3.3* 3.6* 3.8*  NEUTROABS  --   --  2.6  HGB 17.3* 15.9 15.0  HCT 48.5 44.6 42.8  MCV 97.2 97.6 98.2  PLT 37* 35* 30*    Cardiac Enzymes:  Recent Labs Lab 06/11/13 1910 06/11/13 2245 06/12/13 0410  TROPONINI <0.30 <0.30 <0.30    Lipid Panel: No results found for this basename: CHOL, TRIG, HDL, CHOLHDL, VLDL, LDLCALC,  in the last 168 hours  CBG:  Recent Labs Lab 06/12/13 0455 06/12/13 0610 06/12/13 0714 06/12/13 0912 06/12/13 1026  GLUCAP 157* 143* 129* 143* 134*    Microbiology: Results for orders placed during the hospital encounter of 10/21/12  CLOSTRIDIUM DIFFICILE BY PCR     Status: None   Collection Time    10/22/12 10:42  AM      Result Value Ref Range Status   C difficile by pcr NEGATIVE  NEGATIVE Final    Coagulation Studies: No results found for this basename: LABPROT, INR,  in the last 72 hours  Imaging: Dg Chest 2 View  06/11/2013   CLINICAL DATA:  Alcohol intoxication.  Medical clinic.  EXAM: CHEST  2 VIEW  COMPARISON:  05/24/2013  FINDINGS: Cardiac silhouette is normal in size. Normal mediastinal and hilar contours. Lung volumes are low. There is also mild respiratory motion. Allowing for this, the lungs are clear. No pleural effusion or pneumothorax.  Bony thorax is intact.  IMPRESSION: No active cardiopulmonary disease.   Electronically Signed   By: Amie Portland M.D.   On: 06/11/2013 21:23   Ct Head Wo Contrast  06/11/2013   CLINICAL DATA:  Status post fall  EXAM: CT HEAD WITHOUT CONTRAST  TECHNIQUE: Contiguous axial images were obtained from the base of the skull through the vertex without intravenous contrast.  COMPARISON:  Noncontrast CT scan of the brain dated January 27, 2012.  FINDINGS: The ventricles are normal in size and position. There is no intracranial hemorrhage nor intracranial mass effect. There is no evidence of an evolving ischemic infarction. The cerebellum and brainstem are normal in density. There are no abnormal intracranial calcifications.  At bone window settings there is  no evidence of an acute skull fracture. The observed portions of the paranasal sinuses and mastoid air cells are clear.  IMPRESSION: There is no acute intracranial abnormality.   Electronically Signed   By: Josejuan  [place]   On: 06/11/2013 20:13   Mr Brain Wo Contrast  06/12/2013   CLINICAL DATA:  ALCOHOL INTOXICATION MEDICAL CLEARANCE  EXAM: MRI HEAD WITHOUT CONTRAST  TECHNIQUE: Multiplanar, multiecho pulse sequences of the brain and surrounding structures were obtained without intravenous contrast.  COMPARISON:  CT HEAD W/O CM dated 06/11/2013; MR HEAD WO/W CM dated 10/21/2012  FINDINGS: Moderately motion degraded  examination. The ventricles and sulci are normal for patient's age. No abnormal parenchymal signal, mass lesions, mass effect. No reduced diffusion to suggest acute ischemia. No susceptibility artifact to suggest hemorrhage. Again noted is T1 shortening of the basal ganglia and to lesser extent thalamus which can be seen with chronic liver disease, similar.  No abnormal extra-axial fluid collections. No extra-axial masses though, contrast enhanced sequences would be more sensitive. Normal major intracranial vascular flow voids seen at the skull base.  Ocular globes and orbital contents are unremarkable though not tailored for evaluation. No abnormal sellar expansion. Visualized paranasal sinuses and mastoid air cells are well-aerated. No suspicious calvarial bone marrow signal. No abnormal sellar expansion. Craniocervical junction maintained.  IMPRESSION: No acute intracranial process on this moderately motion degraded examination.   Electronically Signed   By: Awilda Metro   On: 06/12/2013 00:10   US Abdomen Complete  06/12/2013   CLINICAL DATA:  Abdominal distention.  EXAM: ULTRASOUND ABDOMEN COMPLETE  COMPARISON:  CT of the abdomen and pelvis performed 01/29/2012  FINDINGS: Gallbladder:  Status post cholecystectomy. Gallbladder fossa not well assessed due to overlying bowel gas.  Common bile duct:  Not visualized.  Liver:  No dominant lesion identified. The liver is not well assessed due to diffuse heterogeneity and increased echogenicity. This reflects the patient's known hepatic cirrhosis.  IVC:  Not visualized.  Pancreas:  The visualized portions of the pancreas are grossly unremarkable.  Spleen:  15.3 cm in length, reflecting splenomegaly, with a somewhat bulky appearance.  Right Kidney:  Length: 13.8 cm. Echogenicity within normal limits. No mass or hydronephrosis visualized.  Left Kidney:  Length: 14.7 cm. Echogenicity within normal limits. No mass or hydronephrosis visualized.  Abdominal aorta:  No  aneurysm visualized. Difficult to fully characterize due to overlying bowel gas.  Other findings:  None.  IMPRESSION: Limited evaluation of the abdomen due to overlying bowel gas and attenuation from the liver.  1. Hepatic cirrhosis noted; no dominant mass seen, though evaluation of the liver is limited. 2. Status post cholecystectomy. 3. Splenomegaly again noted.   Electronically Signed   By: Roanna Raider M.D.   On: 06/12/2013 01:26       MEDICATIONS  Scheduled: . insulin aspart  0-5 Units Subcutaneous QHS  . insulin aspart  0-9 Units Subcutaneous TID WC  . lactulose  20 g Oral TID  . LORazepam  0-4 mg Oral 4 times per day   Followed by  . [START ON 06/13/2013] LORazepam  0-4 mg Oral Q12H  . pantoprazole (PROTONIX) IV  40 mg Intravenous Q12H  . propranolol  10 mg Oral TID  . rifaximin  550 mg Oral BID  . sodium chloride  3 mL Intravenous Q12H  . thiamine  100 mg Oral Daily   Or  . thiamine  100 mg Intravenous Daily    ASSESSMENT/PLAN:                                                                                                            Left arm weakness and left hand decreased sensation for 8 days.  MRI brain negative for CVA. C-spine MRI pending. IF MRI C-spine negative would recommend patient follow up as out patient for EMG/NCV for peripheral origin.      Assessment and plan discussed with with attending physician and they are in agreement.    Felicie MornDavid Smith PA-C [facility] [phone]  06/12/2013, 12:00 PM

## 2013-06-12 NOTE — Progress Notes (Addendum)
[facility] Progress Note [place] TEAM 1 - Stepdown/ICU TEAM   [name] BearDavid Sia UJW:[id number] DOB: 02-Aug-1965 DOA: 06/11/2013 PCP: Jeanann LewandowskyJEGEDE, OLUGBEMIGA, MD  Brief narrative: [name] [name] is a 48 y.o. male presenting on 06/11/2013 with  has a past medical history of Sleep apnea; Diabetes mellitus without complication; GERD (gastroesophageal reflux disease); H/O hiatal hernia; Seizures who tells me he came to the hospital for left arm weakness (8 days), headache on the back of his head (also 8 days) and because he was drinking too much alcohol. He is nauseated but has not had any vomiting. He was thought to have hepatic encephalopathy and started on lactulose- has has had diarrhea today. C/o abdominal pain- points to epigastrium.  Tells me he takes 2 pills at home but does not remember them.   Subjective: See above  Assessment/Plan: Principal Problem:   Left arm weakness - neuro following- MRI brain negative for CVA therefore CT neck ordered which is pending  Active Problems:   Alcohol abuse - follow on CIWA for withdrawal- appears he may want detox   Abdominal pain - placed on antibiotics for presumed SBP but has no ascites on exam or on ultrasound - d/c antibiotics- -- PPI for acute which is more likely considering the site (epigastric) and the ETOH abuse- he ws discharged on Omeprazole on 8/18    Thrombocytopenia, secondary - - follow  Hypoxia - 93 % on 2 L but not c/o dyspnea cough or chest pain- not a smoker-  follow- ambulate    DM type 2 (diabetes mellitus, type 2) - Hba1c 7- not sure if he takes meds for it at home or if he adheres to a diabetic diet- currently wanting a hamburger and jello    Esophageal varices in alcoholic cirrhosis - placed on Propranolol here    Liver cirrhosis, alcoholic - obtain PT/ INR to check child Pugh score - holding Aldactone- not sure if he is taking it at home  Elevated LFTs - likley acute alcohol abuse related     Encephalopathy - due to ETOH intoxication?? Vs hepatic encephalopathy- alcohol level was 395! will d/c Lactulose and Xifaxin for now    S/P cholecystectomy   Splenomegaly  Headache - Tyelenol    Code Status: Full code Family Communication: none Disposition Plan: home in 1-2 days  Consultants: none  Procedures: none  Antibiotics: Antibiotics Given (last 72 hours)   Date/Time Action Medication Dose   06/12/13 0215 Given   rifaximin (XIFAXAN) tablet 550 mg 550 mg   06/12/13 1210 Given   rifaximin (XIFAXAN) tablet 550 mg 550 mg       DVT prophylaxis: none  Objective: Filed Weights   06/12/13 1352  Weight: 105.7 kg (233 lb 0.4 oz)   Blood pressure 132/86, pulse 79, temperature 98.7 F (37.1 C), temperature source Oral, resp. rate 19, height 5\' 11"  (1.803 m), weight 105.7 kg (233 lb 0.4 oz), SpO2 93.00%.  Intake/Output Summary (Last 24 hours) at 06/12/13 1636 Last data filed at 06/12/13 1300  Gross per 24 hour  Intake      0 ml  Output    301 ml  Net   -301 ml     Exam: General: No acute respiratory distress- AAO x 3 Lungs: Clear to auscultation bilaterally without wheezes or crackles Cardiovascular: Regular rate and rhythm without murmur gallop or rub normal S1 and S2 Abdomen: Nontender, nondistended, soft, bowel sounds positive, no rebound, no ascites, no appreciable mass Extremities: No significant cyanosis, clubbing, or edema  bilateral lower extremities  Data Reviewed: Basic Metabolic Panel:  Recent Labs Lab 06/11/13 1547 06/11/13 2245 06/12/13 0405  NA 144  --  144  K 3.8  --  3.7  CL 102  --  106  CO2 25  --  24  GLUCOSE 297*  --  170*  BUN 3*  --  4*  CREATININE 0.52 0.50 0.50  CALCIUM 8.6  --  7.7*  MG  --  1.7  --    Liver Function Tests:  Recent Labs Lab 06/11/13 1547 06/12/13 0405  AST 217* 194*  ALT 138* 120*  ALKPHOS 103 83  BILITOT 3.1* 2.5*  PROT 7.3 6.1  ALBUMIN 3.4* 2.9*   No results found for this basename: LIPASE,  AMYLASE,  in the last 168 hours  Recent Labs Lab 06/11/13 1910  AMMONIA 89*   CBC:  Recent Labs Lab 06/11/13 1547 06/11/13 2245 06/12/13 0405  WBC 3.3* 3.6* 3.8*  NEUTROABS  --   --  2.6  HGB 17.3* 15.9 15.0  HCT 48.5 44.6 42.8  MCV 97.2 97.6 98.2  PLT 37* 35* 30*   Cardiac Enzymes:  Recent Labs Lab 06/11/13 1910 06/11/13 2245 06/12/13 0410  TROPONINI <0.30 <0.30 <0.30   BNP (last 3 results)  Recent Labs  10/31/12 1654 06/11/13 2245  PROBNP 74.9 <5.0   CBG:  Recent Labs Lab 06/12/13 0610 06/12/13 0714 06/12/13 0912 06/12/13 1026 06/12/13 1354  GLUCAP 143* 129* 143* 134* 190*    Recent Results (from the past 240 hour(s))  MRSA PCR SCREENING     Status: None   Collection Time    06/12/13  1:54 PM      Result Value Ref Range Status   MRSA by PCR NEGATIVE  NEGATIVE Final   Comment:            The GeneXpert MRSA Assay (FDA     approved for NASAL specimens     only), is one component of a     comprehensive MRSA colonization     surveillance program. It is not     intended to diagnose MRSA     infection nor to guide or     monitor treatment for     MRSA infections.     Studies:  Recent x-ray studies have been reviewed in detail by the Attending Physician  Scheduled Meds:  Scheduled Meds: . insulin aspart  0-5 Units Subcutaneous QHS  . insulin aspart  0-9 Units Subcutaneous TID WC  . lactulose  20 g Oral TID  . LORazepam  0-4 mg Oral 4 times per day   Followed by  . [START ON 06/13/2013] LORazepam  0-4 mg Oral Q12H  . [START ON 06/13/2013] pantoprazole  40 mg Oral Daily  . propranolol  10 mg Oral TID  . rifaximin  550 mg Oral BID  . sodium chloride  3 mL Intravenous Q12H  . thiamine  100 mg Oral Daily   Or  . thiamine  100 mg Intravenous Daily   Continuous Infusions: . sodium chloride 50 mL/hr at 06/12/13 1539  . dextrose 5 % and 0.45% NaCl Stopped (06/12/13 1215)    Time spent on care of this patient: >35 min   Calvert Cantor,  MD  [facility] Office  [phone] Pager - Text Page per Loretha Stapler as per below:  On-Call/Text Page:      [email or web address]  If 7PM-7AM, please contact night-coverage [email or web address] 06/12/2013, 4:36 PM   LOS: 1 day

## 2013-06-12 NOTE — ED Notes (Signed)
MRI called stating that the patient was unable to lay any longer to complete the lumbar spine.  Patient was up to the bathroom and had some stool and vomited blood (per patient) while in MRI.

## 2013-06-13 DIAGNOSIS — K7030 Alcoholic cirrhosis of liver without ascites: Secondary | ICD-10-CM

## 2013-06-13 DIAGNOSIS — R109 Unspecified abdominal pain: Secondary | ICD-10-CM

## 2013-06-13 LAB — COMPREHENSIVE METABOLIC PANEL
ALBUMIN: 2.8 g/dL — AB (ref 3.5–5.2)
ALK PHOS: 82 U/L (ref 39–117)
ALT: 104 U/L — ABNORMAL HIGH (ref 0–53)
AST: 147 U/L — ABNORMAL HIGH (ref 0–37)
BILIRUBIN TOTAL: 5.4 mg/dL — AB (ref 0.3–1.2)
BUN: 5 mg/dL — AB (ref 6–23)
CHLORIDE: 104 meq/L (ref 96–112)
CO2: 23 meq/L (ref 19–32)
Calcium: 8.2 mg/dL — ABNORMAL LOW (ref 8.4–10.5)
Creatinine, Ser: 0.45 mg/dL — ABNORMAL LOW (ref 0.50–1.35)
GFR calc non Af Amer: >90 mL/min (ref >90)
GLUCOSE: 111 mg/dL — AB (ref 70–99)
POTASSIUM: 4.4 meq/L (ref 3.7–5.3)
Sodium: 139 mEq/L (ref 137–147)
Total Protein: 5.9 g/dL — ABNORMAL LOW (ref 6.0–8.3)

## 2013-06-13 LAB — CBC WITH DIFFERENTIAL/PLATELET
Basophils Absolute: 0 10*3/uL (ref 0.0–0.1)
Basophils Relative: 1 % (ref 0–1)
EOS PCT: 3 % (ref 0–5)
Eosinophils Absolute: 0.1 10*3/uL (ref 0.0–0.7)
HCT: 43.8 % (ref 39.0–52.0)
Hemoglobin: 15.3 g/dL (ref 13.0–17.0)
LYMPHS PCT: 17 % (ref 12–46)
Lymphs Abs: 0.6 10*3/uL — ABNORMAL LOW (ref 0.7–4.0)
MCH: 34.6 pg — ABNORMAL HIGH (ref 26.0–34.0)
MCHC: 34.9 g/dL (ref 30.0–36.0)
MCV: 99.1 fL (ref 78.0–100.0)
Monocytes Absolute: 0.5 10*3/uL (ref 0.1–1.0)
Monocytes Relative: 13 % — ABNORMAL HIGH (ref 3–12)
Neutro Abs: 2.4 10*3/uL (ref 1.7–7.7)
Neutrophils Relative %: 67 % (ref 43–77)
PLATELETS: 26 10*3/uL — AB (ref 150–400)
RBC: 4.42 MIL/uL (ref 4.22–5.81)
RDW: 14.6 % (ref 11.5–15.5)
WBC: 3.6 10*3/uL — ABNORMAL LOW (ref 4.0–10.5)

## 2013-06-13 LAB — GLUCOSE, CAPILLARY
GLUCOSE-CAPILLARY: 180 mg/dL — AB (ref 70–99)
Glucose-Capillary: 98 mg/dL (ref 70–99)
Glucose-Capillary: 105 mg/dL — ABNORMAL HIGH (ref 70–99)
Glucose-Capillary: 175 mg/dL — ABNORMAL HIGH (ref 70–99)

## 2013-06-13 LAB — URINE CULTURE
CULTURE: NO GROWTH
CULTURE: NO GROWTH
Colony Count: NO GROWTH
Colony Count: NO GROWTH

## 2013-06-13 LAB — AMMONIA: Ammonia: 134 umol/L — ABNORMAL HIGH (ref 11–60)

## 2013-06-13 MED ORDER — LACTULOSE 10 GM/15ML PO SOLN
30.0000 g | Freq: Three times a day (TID) | ORAL | Status: DC
  Administered 2013-06-13 – 2013-06-15 (×5): 30 g via ORAL
  Filled 2013-06-13 (×10): qty 45

## 2013-06-13 MED ORDER — FOLIC ACID 1 MG PO TABS
1.0000 mg | ORAL_TABLET | Freq: Every day | ORAL | Status: DC
  Administered 2013-06-13 – 2013-06-17 (×5): 1 mg via ORAL
  Filled 2013-06-13 (×5): qty 1

## 2013-06-13 MED ORDER — OXYCODONE HCL 5 MG PO TABS
5.0000 mg | ORAL_TABLET | Freq: Four times a day (QID) | ORAL | Status: DC | PRN
  Administered 2013-06-14 – 2013-06-15 (×3): 5 mg via ORAL
  Filled 2013-06-13 (×3): qty 1

## 2013-06-13 NOTE — Progress Notes (Signed)
Progress Note [place] TEAM 1 - Stepdown/ICU TEAM   [name] [name] UJW:[id number] DOB: Mar 22, 1965 DOA: 06/11/2013 PCP: Jeanann Lewandowsky, MD  Brief narrative: 48 y.o. male presenting on 06/11/2013 with a past medical history of Sleep apnea; Diabetes mellitus; GERD; hiatal hernia; and seizures who came to the hospital for c/o left arm weakness (8 days), headache on the back of his head (also 8 days) and because he was drinking too much alcohol. He was nauseated but had not had any vomiting. He was thought to have hepatic encephalopathy and started on lactulose.   Subjective: Patient states that he feels somewhat better overall.  He appears to be confused however and his history is not felt to be completely reliable at this time.  Assessment/Plan:  Left arm weakness - Neuro has completed a workup - MRI brain and cervical spine negative - no further evaluation felt to be needed - symptoms appear to have resolved  Alcohol abuse - follow on CIWA for withdrawal   Hepatic encephalopathy versus simple alcohol intoxication - Patient remains confused on exam and ammonia was markedly elevated at 134 today -  alcohol level was 395 at presentation but patient should have improved at this point if intoxication alone was to blame - adjust treatment by resuming lactulose and follow  Abdominal pain - placed on antibiotics for presumed SBP but has no ascites on exam or on ultrasound - d/c antibiotics - PPI for acute gastritis which is more likely considering the site (epigastric) and the ETOH abuse - he was discharged on Omeprazole on 8/18  Thrombocytopenia - follow - due to alcohol abuse  Hypoxia - 93 % on 2 L but not c/o dyspnea cough or chest pain - not a smoker - improving - possibly simply due to hypoventilation related to hepatic encephalopathy  DM type 2  - Hba1c 7- not sure if he takes meds for it at home or if he adheres to a diabetic diet - follow trend without changes today  Esophageal  varices in alcoholic cirrhosis - placed on Propranolol   Liver cirrhosis, alcoholic - obtain PT/ INR to check child Pugh score - holding Aldactone - doubt he is taking it at home  Elevated LFTs - likley acute alcohol abuse related - recheck in a.m.  Headache - Resolved - MRI and CT of head without acute findings  Code Status: Full code Family Communication: No family present at time of exam Disposition Plan: SDU for now - possible transfer in a.m.  Consultants: none  Procedures: none  Antibiotics: None  DVT prophylaxis: SCDs  Objective: Filed Weights   06/12/13 1352 06/13/13 0415  Weight: 105.7 kg (233 lb 0.4 oz) 106.6 kg (235 lb 0.2 oz)   Blood pressure 139/63, pulse 63, temperature 98.1 F (36.7 C), temperature source Oral, resp. rate 22, height 5\' 11"  (1.803 m), weight 106.6 kg (235 lb 0.2 oz), SpO2 95.00%.  Intake/Output Summary (Last 24 hours) at 06/13/13 1457 Last data filed at 06/13/13 1000  Gross per 24 hour  Intake    480 ml  Output   1050 ml  Net   -570 ml   Exam: General: No acute respiratory distress - confused Lungs: Clear to auscultation bilaterally without wheezes or crackles Cardiovascular: Regular rate and rhythm without murmur gallop or rub normal S1 and S2 Abdomen: Nontender, soft, bowel sounds positive, no rebound, no appreciable mass Extremities: No significant cyanosis, clubbing, or edema bilateral lower extremities  Data Reviewed: Basic Metabolic Panel:  Recent Labs Lab 06/11/13  1547 06/11/13 2245 06/12/13 0405 06/13/13 0307  NA 144  --  144 139  K 3.8  --  3.7 4.4  CL 102  --  106 104  CO2 25  --  24 23  GLUCOSE 297*  --  170* 111*  BUN 3*  --  4* 5*  CREATININE 0.52 0.50 0.50 0.45*  CALCIUM 8.6  --  7.7* 8.2*  MG  --  1.7  --   --    Liver Function Tests:  Recent Labs Lab 06/11/13 1547 06/12/13 0405 06/13/13 0307  AST 217* 194* 147*  ALT 138* 120* 104*  ALKPHOS 103 83 82  BILITOT 3.1* 2.5* 5.4*  PROT 7.3 6.1  5.9*  ALBUMIN 3.4* 2.9* 2.8*    Recent Labs Lab 06/11/13 1910 06/13/13 0307  AMMONIA 89* 134*   CBC:  Recent Labs Lab 06/11/13 1547 06/11/13 2245 06/12/13 0405 06/13/13 0307  WBC 3.3* 3.6* 3.8* 3.6*  NEUTROABS  --   --  2.6 2.4  HGB 17.3* 15.9 15.0 15.3  HCT 48.5 44.6 42.8 43.8  MCV 97.2 97.6 98.2 99.1  PLT 37* 35* 30* 26*   Cardiac Enzymes:  Recent Labs Lab 06/11/13 1910 06/11/13 2245 06/12/13 0410  TROPONINI <0.30 <0.30 <0.30   BNP (last 3 results)  Recent Labs  10/31/12 1654 06/11/13 2245  PROBNP 74.9 <5.0   CBG:  Recent Labs Lab 06/12/13 2205 06/12/13 2334 06/13/13 0412 06/13/13 0829 06/13/13 1212  GLUCAP 115* 100* 98 105* 175*    Recent Results (from the past 240 hour(s))  URINE CULTURE     Status: None   Collection Time    06/11/13  7:12 PM      Result Value Ref Range Status   Specimen Description URINE, CLEAN CATCH   Final   Special Requests NONE   Final   Culture  Setup Time     Final   Value: 06/11/2013 20:46     Performed at [facility] Count     Final   Value: NO GROWTH     Performed at [facility]   Culture     Final   Value: NO GROWTH     Performed at [facility]   Report Status 06/13/2013 FINAL   Final  URINE CULTURE     Status: None   Collection Time    06/11/13 11:59 PM      Result Value Ref Range Status   Specimen Description URINE, RANDOM   Final   Special Requests NONE   Final   Culture  Setup Time     Final   Value: 06/11/2013 01:34     Performed at [facility] Count     Final   Value: NO GROWTH     Performed at [facility]   Culture     Final   Value: NO GROWTH     Performed at [facility]   Report Status 06/13/2013 FINAL   Final  CULTURE, BLOOD (ROUTINE X 2)     Status: None   Collection Time    06/12/13 12:36 AM      Result Value Ref Range Status   Specimen Description BLOOD LEFT ANTECUBITAL   Final   Special Requests BOTTLES DRAWN  AEROBIC ONLY 5CC   Final   Culture  Setup Time     Final   Value: 06/12/2013 04:53     Performed at [facility]  Culture     Final   Value:        BLOOD CULTURE RECEIVED NO GROWTH TO DATE CULTURE WILL BE HELD FOR 5 DAYS BEFORE ISSUING A FINAL NEGATIVE REPORT     [facility]   Report Status PENDING   Incomplete  CULTURE, BLOOD (ROUTINE X 2)     Status: None   Collection Time    06/12/13 12:43 AM      Result Value Ref Range Status   Specimen Description BLOOD LEFT FOREARM   Final   Special Requests BOTTLES DRAWN AEROBIC AND ANAEROBIC 5CC EA   Final   Culture  Setup Time     Final   Value: 06/12/2013 04:53     [facility]   Culture     Final   Value:        BLOOD CULTURE RECEIVED NO GROWTH TO DATE CULTURE WILL BE HELD FOR 5 DAYS BEFORE ISSUING A FINAL NEGATIVE REPORT     [facility]   Report Status PENDING   Incomplete  MRSA PCR SCREENING     Status: None   Collection Time    06/12/13  1:54 PM      Result Value Ref Range Status   MRSA by PCR NEGATIVE  NEGATIVE Final   Comment:            The GeneXpert MRSA Assay (FDA     approved for NASAL specimens     only), is one component of a     comprehensive MRSA colonization     surveillance program. It is not     intended to diagnose MRSA     infection nor to guide or     monitor treatment for     MRSA infections.     Studies:  Recent x-ray studies have been reviewed in detail by the Attending Physician  Scheduled Meds:  Scheduled Meds: . insulin aspart  0-5 Units Subcutaneous QHS  . insulin aspart  0-9 Units Subcutaneous TID WC  . LORazepam  0-4 mg Oral 4 times per day   Followed by  . LORazepam  0-4 mg Oral Q12H  . pantoprazole  40 mg Oral Daily  . propranolol  10 mg Oral TID  . sodium chloride  3 mL Intravenous Q12H  . thiamine  100 mg Oral Daily   Or  . thiamine  100 mg Intravenous Daily    Time spent on care of this patient: 35  min   Lonia BloodJeffrey T McClung, MD  [facility] Office  [phone] Pager - Text Page per Loretha [email or web address] as per below:  On-Call/Text Page:      [email or web address]  If 7PM-7AM, please contact night-coverage [email or web address] 06/13/2013, 2:57 PM   LOS: 2 days

## 2013-06-13 NOTE — Progress Notes (Signed)
Subjective: Patient reports no sensory changes in the left arm today.  Although he reports that it is still weak when I entered the room he was using his left arm preferentially to operate his phone.  Continues to complain of chest pain particularly when eating.    Objective: Current vital signs: BP 131/68  Pulse 64  Temp(Src) 98.1 F (36.7 C) (Oral)  Resp 18  Ht 5\' 11"  (1.803 m)  Wt 106.6 kg (235 lb 0.2 oz)  BMI 32.79 kg/m2  SpO2 95% Vital signs in last 24 hours: Temp:  [98 F (36.7 C)-98.7 F (37.1 C)] 98.1 F (36.7 C) (04/08 0700) Pulse Rate:  [64-84] 64 (04/08 0410) Resp:  [18-21] 18 (04/08 0410) BP: (124-138)/(64-86) 131/68 mmHg (04/08 0410) SpO2:  [92 %-95 %] 95 % (04/08 0410) Weight:  [105.7 kg (233 lb 0.4 oz)-106.6 kg (235 lb 0.2 oz)] 106.6 kg (235 lb 0.2 oz) (04/08 0415)  Intake/Output from previous day: 04/07 0701 - 04/08 0700 In: 240 [P.O.:240] Out: 1051 [Urine:1050; Stool:1] Intake/Output this shift:   Nutritional status: Full Liquid  Neurologic Exam: Mental Status: Alert, oriented to hospital, year, date and city, thought content appropriate.  Speech fluent without evidence of aphasia.   Able to follow 3 step commands without difficulty. Cranial Nerves: II: Visual fields grossly normal, pupils equal, round, reactive to light and accommodation III,IV, VI: ptosis not present, extra-ocular motions intact bilaterally V,VII: smile symmetric, facial light touch sensation normal bilaterally VIII: hearing normal bilaterally IX,X: gag reflex present XI: bilateral shoulder shrug XII: midline tongue extension without atrophy or fasciculations  Motor: Right : Upper extremity   5/5    Left:     Upper extremity   5/5  Lower extremity   5/5     Lower extremity   5/5 Tone and bulk:normal tone throughout; no atrophy noted Sensory: Pinprick and light touch intact throughout, bilaterally Deep Tendon Reflexes:  Right: Upper Extremity   Left: Upper extremity   biceps (C-5  to C-6) 2/4   biceps (C-5 to C-6) 2/4 tricep (C7) 2/4    triceps (C7) 2/4 Brachioradialis (C6) 2/4  Brachioradialis (C6) 2/4  Lower Extremity Lower Extremity  quadriceps (L-2 to L-4) 2/4   quadriceps (L-2 to L-4) 2/4 Achilles (S1) 2/4   Achilles (S1) 2/4  Plantars: Right: downgoing   Left: downgoing Cerebellar: normal finger-to-nose,  normal heel-to-shin test    Lab Results: Basic Metabolic Panel:  Recent Labs Lab 06/11/13 1547 06/11/13 2245 06/12/13 0405 06/13/13 0307  NA 144  --  144 139  K 3.8  --  3.7 4.4  CL 102  --  106 104  CO2 25  --  24 23  GLUCOSE 297*  --  170* 111*  BUN 3*  --  4* 5*  CREATININE 0.52 0.50 0.50 0.45*  CALCIUM 8.6  --  7.7* 8.2*  MG  --  1.7  --   --     Liver Function Tests:  Recent Labs Lab 06/11/13 1547 06/12/13 0405 06/13/13 0307  AST 217* 194* 147*  ALT 138* 120* 104*  ALKPHOS 103 83 82  BILITOT 3.1* 2.5* 5.4*  PROT 7.3 6.1 5.9*  ALBUMIN 3.4* 2.9* 2.8*   No results found for this basename: LIPASE, AMYLASE,  in the last 168 hours  Recent Labs Lab 06/11/13 1910 06/13/13 0307  AMMONIA 89* 134*    CBC:  Recent Labs Lab 06/11/13 1547 06/11/13 2245 06/12/13 0405 06/13/13 0307  WBC 3.3* 3.6* 3.8* 3.6*  NEUTROABS  --   --  2.6 2.4  HGB 17.3* 15.9 15.0 15.3  HCT 48.5 44.6 42.8 43.8  MCV 97.2 97.6 98.2 99.1  PLT 37* 35* 30* 26*    Cardiac Enzymes:  Recent Labs Lab 06/11/13 1910 06/11/13 2245 06/12/13 0410  TROPONINI <0.30 <0.30 <0.30    Lipid Panel: No results found for this basename: CHOL, TRIG, HDL, CHOLHDL, VLDL, LDLCALC,  in the last 168 hours  CBG:  Recent Labs Lab 06/12/13 1956 06/12/13 2205 06/12/13 2334 06/13/13 0412 06/13/13 0829  GLUCAP 144* 115* 100* 98 105*    Microbiology: Results for orders placed during the hospital encounter of 06/11/13  URINE CULTURE     Status: None   Collection Time    06/11/13  7:12 PM      Result Value Ref Range Status   Specimen Description URINE, CLEAN  CATCH   Final   Special Requests NONE   Final   Culture  Setup Time     Final   Value: 06/11/2013 20:46     Performed at [facility] Count     Final   Value: NO GROWTH     Performed at [facility]   Culture     Final   Value: NO GROWTH     Performed at [facility]   Report Status 06/13/2013 FINAL   Final  URINE CULTURE     Status: None   Collection Time    06/11/13 11:59 PM      Result Value Ref Range Status   Specimen Description URINE, RANDOM   Final   Special Requests NONE   Final   Culture  Setup Time     Final   Value: 06/11/2013 01:34     Performed at [facility] Count     Final   Value: NO GROWTH     Performed at [facility]   Culture     Final   Value: NO GROWTH     Performed at [facility]   Report Status 06/13/2013 FINAL   Final  CULTURE, BLOOD (ROUTINE X 2)     Status: None   Collection Time    06/12/13 12:36 AM      Result Value Ref Range Status   Specimen Description BLOOD LEFT ANTECUBITAL   Final   Special Requests BOTTLES DRAWN AEROBIC ONLY 5CC   Final   Culture  Setup Time     Final   Value: 06/12/2013 04:53     Performed at [facility]   Culture     Final   Value:        BLOOD CULTURE RECEIVED NO GROWTH TO DATE CULTURE WILL BE HELD FOR 5 DAYS BEFORE ISSUING A FINAL NEGATIVE REPORT     Performed at [facility]   Report Status PENDING   Incomplete  CULTURE, BLOOD (ROUTINE X 2)     Status: None   Collection Time    06/12/13 12:43 AM      Result Value Ref Range Status   Specimen Description BLOOD LEFT FOREARM   Final   Special Requests BOTTLES DRAWN AEROBIC AND ANAEROBIC 5CC EA   Final   Culture  Setup Time     Final   Value: 06/12/2013 04:53     Performed at [facility]   Culture     Final   Value:        BLOOD CULTURE RECEIVED NO GROWTH TO DATE CULTURE  WILL BE HELD FOR 5 DAYS BEFORE ISSUING A FINAL NEGATIVE REPORT     Performed at [facility]    Report Status PENDING   Incomplete  MRSA PCR SCREENING     Status: None   Collection Time    06/12/13  1:54 PM      Result Value Ref Range Status   MRSA by PCR NEGATIVE  NEGATIVE Final   Comment:            The GeneXpert MRSA Assay (FDA     approved for NASAL specimens     only), is one component of a     comprehensive MRSA colonization     surveillance program. It is not     intended to diagnose MRSA     infection nor to guide or     monitor treatment for     MRSA infections.    Coagulation Studies: No results found for this basename: LABPROT, INR,  in the last 72 hours  Imaging: Dg Chest 2 View  06/11/2013   CLINICAL DATA:  Alcohol intoxication.  Medical clinic.  EXAM: CHEST  2 VIEW  COMPARISON:  05/24/2013  FINDINGS: Cardiac silhouette is normal in size. Normal mediastinal and hilar contours. Lung volumes are low. There is also mild respiratory motion. Allowing for this, the lungs are clear. No pleural effusion or pneumothorax.  Bony thorax is intact.  IMPRESSION: No active cardiopulmonary disease.   Electronically Signed   By: Amie Portland M.D.   On: 06/11/2013 21:23   Ct Head Wo Contrast  06/11/2013   CLINICAL DATA:  Status post fall  EXAM: CT HEAD WITHOUT CONTRAST  TECHNIQUE: Contiguous axial images were obtained from the base of the skull through the vertex without intravenous contrast.  COMPARISON:  Noncontrast CT scan of the brain dated January 27, 2012.  FINDINGS: The ventricles are normal in size and position. There is no intracranial hemorrhage nor intracranial mass effect. There is no evidence of an evolving ischemic infarction. The cerebellum and brainstem are normal in density. There are no abnormal intracranial calcifications.  At bone window settings there is no evidence of an acute skull fracture. The observed portions of the paranasal sinuses and mastoid air cells are clear.  IMPRESSION: There is no acute intracranial abnormality.   Electronically Signed   By: Lorren   [place]   On: 06/11/2013 20:13   Mr Brain Wo Contrast  06/12/2013   CLINICAL DATA:  ALCOHOL INTOXICATION MEDICAL CLEARANCE  EXAM: MRI HEAD WITHOUT CONTRAST  TECHNIQUE: Multiplanar, multiecho pulse sequences of the brain and surrounding structures were obtained without intravenous contrast.  COMPARISON:  CT HEAD W/O CM dated 06/11/2013; MR HEAD WO/W CM dated 10/21/2012  FINDINGS: Moderately motion degraded examination. The ventricles and sulci are normal for patient's age. No abnormal parenchymal signal, mass lesions, mass effect. No reduced diffusion to suggest acute ischemia. No susceptibility artifact to suggest hemorrhage. Again noted is T1 shortening of the basal ganglia and to lesser extent thalamus which can be seen with chronic liver disease, similar.  No abnormal extra-axial fluid collections. No extra-axial masses though, contrast enhanced sequences would be more sensitive. Normal major intracranial vascular flow voids seen at the skull base.  Ocular globes and orbital contents are unremarkable though not tailored for evaluation. No abnormal sellar expansion. Visualized paranasal sinuses and mastoid air cells are well-aerated. No suspicious calvarial bone marrow signal. No abnormal sellar expansion. Craniocervical junction maintained.  IMPRESSION: No acute intracranial process on this moderately motion  degraded examination.   Electronically Signed   By: Awilda Metroourtnay  Bloomer   On: 06/12/2013 00:10   Mr Cervical Spine Wo Contrast  06/12/2013   CLINICAL DATA:  Left arm weakness after a fall.  EXAM: MRI CERVICAL SPINE WITHOUT CONTRAST  TECHNIQUE: Multiplanar, multisequence MR imaging was performed. No intravenous contrast was administered.  COMPARISON:  MR HEAD W/O CM dated 06/11/2013; CT HEAD W/O CM dated 06/11/2013  FINDINGS: Cervical vertebral bodies appear intact and aligned with maintenance of the cervical lordosis. Intervertebral disc heights demonstrate normal morphology, there is slight decreased T2 signal  most consistent with mild desiccation. No abnormal bone marrow signal, specifically no bright STIR signal to suggest acute osseous process.  Cervical spinal cord appears overall normal in morphology and signal characteristics, motion degrades sensitivity for subtle cord signal abnormality. No syrinx. No myelomalacia. Craniocervical junction appears intact. Included prevertebral and paraspinal soft tissues are nonsuspicious. Midline small posterior fossa arachnoid cyst versus mega cisterna magna.  Level by level evaluation (motion degraded axial sequences decrease evaluation):  C2-3: No disc bulge, canal stenosis nor neural foraminal narrowing.  C3-4: Moderate right subarticular broad-based disc protrusion. Uncovertebral hypertrophy and mild facet arthropathy. Very mild canal stenosis with moderate to severe right neural foraminal narrowing. No left neural foraminal narrowing.  C4-5: Minimal uncovertebral hypertrophy and mild facet arthropathy without canal stenosis. No disc bulge. Minimal left neural foraminal narrowing.  C5-6: Uncovertebral hypertrophy. No disc bulge. Moderate to severe right and moderate left facet arthropathy without canal stenosis. Mild to moderate bilateral neural foraminal narrowing.  C6-7: No disc bulge. Minimal uncovertebral hypertrophy without canal stenosis or neural foraminal narrowing. Mild facet arthropathy.  C7-T1: No disc bulge, canal stenosis nor neural foraminal narrowing.  IMPRESSION: Motion degraded examination without acute fracture nor malalignment. No convincing evidence of cervical spinal cord signal abnormality.  Degenerative change of the cervical spine with resultant very mild canal stenosis at C3-4.  Neural foraminal narrowing C3-4 through C5-6: Moderate to severe on the right at C3-4.   Electronically Signed   By: Awilda Metroourtnay  Bloomer   On: 06/12/2013 22:22   [place] Abdomen Complete  06/12/2013   CLINICAL DATA:  Abdominal distention.  EXAM: ULTRASOUND ABDOMEN COMPLETE   COMPARISON:  CT of the abdomen and pelvis performed 01/29/2012  FINDINGS: Gallbladder:  Status post cholecystectomy. Gallbladder fossa not well assessed due to overlying bowel gas.  Common bile duct:  Not visualized.  Liver:  No dominant lesion identified. The liver is not well assessed due to diffuse heterogeneity and increased echogenicity. This reflects the patient's known hepatic cirrhosis.  IVC:  Not visualized.  Pancreas:  The visualized portions of the pancreas are grossly unremarkable.  Spleen:  15.3 cm in length, reflecting splenomegaly, with a somewhat bulky appearance.  Right Kidney:  Length: 13.8 cm. Echogenicity within normal limits. No mass or hydronephrosis visualized.  Left Kidney:  Length: 14.7 cm. Echogenicity within normal limits. No mass or hydronephrosis visualized.  Abdominal aorta:  No aneurysm visualized. Difficult to fully characterize due to overlying bowel gas.  Other findings:  None.  IMPRESSION: Limited evaluation of the abdomen due to overlying bowel gas and attenuation from the liver.  1. Hepatic cirrhosis noted; no dominant mass seen, though evaluation of the liver is limited. 2. Status post cholecystectomy. 3. Splenomegaly again noted.   Electronically Signed   By: Roanna RaiderJeffery  Chang M.D.   On: 06/12/2013 01:26    Medications:  Scheduled: . insulin aspart  0-5 Units Subcutaneous QHS  . insulin aspart  0-9 Units Subcutaneous TID WC  . LORazepam  0-4 mg Oral 4 times per day   Followed by  . LORazepam  0-4 mg Oral Q12H  . pantoprazole  40 mg Oral Daily  . propranolol  10 mg Oral TID  . sodium chloride  3 mL Intravenous Q12H  . thiamine  100 mg Oral Daily   Or  . thiamine  100 mg Intravenous Daily    Assessment/Plan: Patient much improved.  Moving LUE well.  Suspect a compression palsy.  MRI of the brain reviewed and shows no acute changes.  MRI of the cervical spine shows nothing to explain the patient's symptoms-most pathology is on the right.  Recommendations: 1.  No  further neurologic intervention is recommended at this time.  If further questions arise, please call or page at that time.  Thank you for allowing neurology to participate in the care of this patient.     LOS: 2 days   Thana Farr, MD [facility] [phone]  06/13/2013  9:53 AM

## 2013-06-13 NOTE — Progress Notes (Signed)
CRITICAL VALUE ALERT  Critical value received:  Platelet count of 26   Date of notification:  06/13/2013  Time of notification:  0422  Critical value read back:yes  Nurse who received alert:  Kathlene CoteMelissa Broedy Osbourne  MD notified (1st page):  Maren ReamerKaren Kirby   Time of first page:  0427  MD notified (2nd page):  Time of second page:  Responding MD:  awaiting  Time MD responded:  awaiting

## 2013-06-14 DIAGNOSIS — D6959 Other secondary thrombocytopenia: Secondary | ICD-10-CM

## 2013-06-14 LAB — GLUCOSE, CAPILLARY
GLUCOSE-CAPILLARY: 200 mg/dL — AB (ref 70–99)
Glucose-Capillary: 148 mg/dL — ABNORMAL HIGH (ref 70–99)
Glucose-Capillary: 143 mg/dL — ABNORMAL HIGH (ref 70–99)
Glucose-Capillary: 186 mg/dL — ABNORMAL HIGH (ref 70–99)
Glucose-Capillary: 173 mg/dL — ABNORMAL HIGH (ref 70–99)
Glucose-Capillary: 142 mg/dL — ABNORMAL HIGH (ref 70–99)

## 2013-06-14 LAB — COMPREHENSIVE METABOLIC PANEL
ALT: 76 U/L — AB (ref 0–53)
AST: 83 U/L — ABNORMAL HIGH (ref 0–37)
Albumin: 2.7 g/dL — ABNORMAL LOW (ref 3.5–5.2)
Alkaline Phosphatase: 79 U/L (ref 39–117)
BUN: 4 mg/dL — ABNORMAL LOW (ref 6–23)
CO2: 22 meq/L (ref 19–32)
CREATININE: 0.47 mg/dL — AB (ref 0.50–1.35)
Calcium: 8.6 mg/dL (ref 8.4–10.5)
Chloride: 106 mEq/L (ref 96–112)
GLUCOSE: 208 mg/dL — AB (ref 70–99)
Potassium: 3.4 mEq/L — ABNORMAL LOW (ref 3.7–5.3)
SODIUM: 142 meq/L (ref 137–147)
TOTAL PROTEIN: 5.9 g/dL — AB (ref 6.0–8.3)
Total Bilirubin: 3.7 mg/dL — ABNORMAL HIGH (ref 0.3–1.2)

## 2013-06-14 LAB — AMMONIA: AMMONIA: 113 umol/L — AB (ref 11–60)

## 2013-06-14 LAB — HIV ANTIBODY (ROUTINE TESTING W REFLEX): HIV 1&2 Ab, 4th Generation: NONREACTIVE

## 2013-06-14 LAB — CBC
HCT: 42.3 % (ref 39.0–52.0)
Hemoglobin: 15.2 g/dL (ref 13.0–17.0)
MCH: 34.3 pg — AB (ref 26.0–34.0)
MCHC: 35.9 g/dL (ref 30.0–36.0)
MCV: 95.5 fL (ref 78.0–100.0)
PLATELETS: 24 10*3/uL — AB (ref 150–400)
RBC: 4.43 MIL/uL (ref 4.22–5.81)
RDW: 14.5 % (ref 11.5–15.5)
WBC: 2.3 10*3/uL — ABNORMAL LOW (ref 4.0–10.5)

## 2013-06-14 LAB — APTT: aPTT: 37 seconds (ref 24–37)

## 2013-06-14 LAB — PROTIME-INR
INR: 1.71 — AB (ref 0.00–1.49)
Prothrombin Time: 19.6 seconds — ABNORMAL HIGH (ref 11.6–15.2)

## 2013-06-14 MED ORDER — POTASSIUM CHLORIDE IN NACL 20-0.9 MEQ/L-% IV SOLN
INTRAVENOUS | Status: DC
  Administered 2013-06-14 – 2013-06-15 (×2): via INTRAVENOUS
  Filled 2013-06-14 (×5): qty 1000

## 2013-06-14 NOTE — Progress Notes (Signed)
[facility] PROGRESS NOTE  [name] [name] [name] DOB: [birth year] DOA: 06/11/2013 PCP: Jeanann Lewandowsky, MD  Brief narrative:  48 y.o. male presenting on 06/11/2013 with a past medical history of Sleep apnea; Diabetes mellitus; GERD; hiatal hernia; and alcoholism, seizures who came to the hospital for c/o left arm weakness (8 days), headache because he was drinking too much alcohol. He was nauseated but had not had any vomiting. He was thought to have hepatic encephalopathy and started on lactulose.   Assessment/Plan:  1. Left arm weakness - Neuro has completed a workup - MRI brain and cervical spine negative - no further evaluation felt to be needed - symptoms appear to have resolved  2. Alcohol abuse  - follow on CIWA for withdrawal; consulted psych  likely need impatent detox   3. Hepatic encephalopathy versus simple alcohol intoxication  - improving, mild confused on exam and ammonia was markedly elevated at 134 -->113- alcohol level was 395 at presentation  -cont lactulose and follow, recheck ammonia   4. Abdominal pain  - placed on antibiotics for presumed SBP but has no ascites on exam or on ultrasound - d/c antibiotics  - PPI for acute gastritis which is more likely considering the site (epigastric) and the ETOH abuse - he was discharged on Omeprazole 5. Thrombocytopenia due to cirrhosis; no s/s bleeding ; follow - due to alcohol abuse  6. Hypoxia  - 93 % on 2 L but not c/o dyspnea cough or chest pain - not a smoker - improving - possibly simply due to hypoventilation related to hepatic encephalopathy  7. DM type 2  - Hba1c 7- not sure if he takes meds for it at home or if he adheres to a diabetic diet - follow trend without changes today  8. Esophageal varices in alcoholic cirrhosis  - placed on Propranolol  9. Liver cirrhosis, alcoholic  - holding Aldactone - doubt he is taking it at home  10. Headache  - Resolved - MRI and CT of head without acute  findings    Code Status: full Family Communication: d/w patient,  (indicate person spoken with, relationship, and if by phone, the number) Disposition Plan: home 24-48 hours    Consultants:  None   Procedures:  none  Antibiotics:  none (indicate start date, and stop date if known)  HPI/Subjective: alert  Objective: Filed Vitals:   06/14/13 0800  BP: 128/67  Pulse: 67  Temp:   Resp: 17    Intake/Output Summary (Last 24 hours) at 06/14/13 1340 Last data filed at 06/14/13 1000  Gross per 24 hour  Intake   2040 ml  Output   1200 ml  Net    840 ml   Filed Weights   06/12/13 1352 06/13/13 0415 06/14/13 0400  Weight: 105.7 kg (233 lb 0.4 oz) 106.6 kg (235 lb 0.2 oz) 105.6 kg (232 lb 12.9 oz)    Exam:   General:  alert  Cardiovascular: s1,s2rrr  Respiratory: cta bl  Abdomen: soft,nt,nd  Musculoskeletal: no le edema   Data Reviewed: Basic Metabolic Panel:  Recent Labs Lab 06/11/13 1547 06/11/13 2245 06/12/13 0405 06/13/13 0307 06/14/13 0328  NA 144  --  144 139 142  K 3.8  --  3.7 4.4 3.4*  CL 102  --  106 104 106  CO2 25  --  24 23 22   GLUCOSE 297*  --  170* 111* 208*  BUN 3*  --  4* 5* 4*  CREATININE 0.52 0.50 0.50 0.45* 0.47*  CALCIUM 8.6  --  7.7* 8.2* 8.6  MG  --  1.7  --   --   --    Liver Function Tests:  Recent Labs Lab 06/11/13 1547 06/12/13 0405 06/13/13 0307 06/14/13 0328  AST 217* 194* 147* 83*  ALT 138* 120* 104* 76*  ALKPHOS 103 83 82 79  BILITOT 3.1* 2.5* 5.4* 3.7*  PROT 7.3 6.1 5.9* 5.9*  ALBUMIN 3.4* 2.9* 2.8* 2.7*   No results found for this basename: LIPASE, AMYLASE,  in the last 168 hours  Recent Labs Lab 06/11/13 1910 06/13/13 0307 06/14/13 0328  AMMONIA 89* 134* 113*   CBC:  Recent Labs Lab 06/11/13 1547 06/11/13 2245 06/12/13 0405 06/13/13 0307 06/14/13 0328  WBC 3.3* 3.6* 3.8* 3.6* 2.3*  NEUTROABS  --   --  2.6 2.4  --   HGB 17.3* 15.9 15.0 15.3 15.2  HCT 48.5 44.6 42.8 43.8 42.3  MCV  97.2 97.6 98.2 99.1 95.5  PLT 37* 35* 30* 26* 24*   Cardiac Enzymes:  Recent Labs Lab 06/11/13 1910 06/11/13 2245 06/12/13 0410  TROPONINI <0.30 <0.30 <0.30   BNP (last 3 results)  Recent Labs  10/31/12 1654 06/11/13 2245  PROBNP 74.9 <5.0   CBG:  Recent Labs Lab 06/13/13 1212 06/13/13 1612 06/13/13 2022 06/13/13 2227 06/14/13 0744  GLUCAP 175* 180* 200* 148* 143*    Recent Results (from the past 240 hour(s))  URINE CULTURE     Status: None   Collection Time    06/11/13  7:12 PM      Result Value Ref Range Status   Specimen Description URINE, CLEAN CATCH   Final   Special Requests NONE   Final   Culture  Setup Time     Final   Value: 06/11/2013 20:46     Performed at [facility] Count     Final   Value: NO GROWTH     Performed at [facility]   Culture     Final   Value: NO GROWTH     Performed at [facility]   Report Status 06/13/2013 FINAL   Final  URINE CULTURE     Status: None   Collection Time    06/11/13 11:59 PM      Result Value Ref Range Status   Specimen Description URINE, RANDOM   Final   Special Requests NONE   Final   Culture  Setup Time     Final   Value: 06/11/2013 01:34     Performed at [facility] Count     Final   Value: NO GROWTH     Performed at [facility]   Culture     Final   Value: NO GROWTH     Performed at [facility]   Report Status 06/13/2013 FINAL   Final  CULTURE, BLOOD (ROUTINE X 2)     Status: None   Collection Time    06/12/13 12:36 AM      Result Value Ref Range Status   Specimen Description BLOOD LEFT ANTECUBITAL   Final   Special Requests BOTTLES DRAWN AEROBIC ONLY 5CC   Final   Culture  Setup Time     Final   Value: 06/12/2013 04:53     Performed at [facility]   Culture     Final   Value:        BLOOD CULTURE RECEIVED NO GROWTH TO DATE  CULTURE WILL BE HELD FOR 5 DAYS BEFORE ISSUING A FINAL NEGATIVE REPORT     Performed at  [facility]   Report Status PENDING   Incomplete  CULTURE, BLOOD (ROUTINE X 2)     Status: None   Collection Time    06/12/13 12:43 AM      Result Value Ref Range Status   Specimen Description BLOOD LEFT FOREARM   Final   Special Requests BOTTLES DRAWN AEROBIC AND ANAEROBIC 5CC EA   Final   Culture  Setup Time     Final   Value: 06/12/2013 04:53     Performed at [facility]   Culture     Final   Value:        BLOOD CULTURE RECEIVED NO GROWTH TO DATE CULTURE WILL BE HELD FOR 5 DAYS BEFORE ISSUING A FINAL NEGATIVE REPORT     Performed at [facility]   Report Status PENDING   Incomplete  MRSA PCR SCREENING     Status: None   Collection Time    06/12/13  1:54 PM      Result Value Ref Range Status   MRSA by PCR NEGATIVE  NEGATIVE Final   Comment:            The GeneXpert MRSA Assay (FDA     approved for NASAL specimens     only), is one component of a     comprehensive MRSA colonization     surveillance program. It is not     intended to diagnose MRSA     infection nor to guide or     monitor treatment for     MRSA infections.     Studies: Mr Cervical Spine Wo Contrast  06/12/2013   CLINICAL DATA:  Left arm weakness after a fall.  EXAM: MRI CERVICAL SPINE WITHOUT CONTRAST  TECHNIQUE: Multiplanar, multisequence MR imaging was performed. No intravenous contrast was administered.  COMPARISON:  MR HEAD W/O CM dated 06/11/2013; CT HEAD W/O CM dated 06/11/2013  FINDINGS: Cervical vertebral bodies appear intact and aligned with maintenance of the cervical lordosis. Intervertebral disc heights demonstrate normal morphology, there is slight decreased T2 signal most consistent with mild desiccation. No abnormal bone marrow signal, specifically no bright STIR signal to suggest acute osseous process.  Cervical spinal cord appears overall normal in morphology and signal characteristics, motion degrades sensitivity for subtle cord signal abnormality. No syrinx. No myelomalacia.  Craniocervical junction appears intact. Included prevertebral and paraspinal soft tissues are nonsuspicious. Midline small posterior fossa arachnoid cyst versus mega cisterna magna.  Level by level evaluation (motion degraded axial sequences decrease evaluation):  C2-3: No disc bulge, canal stenosis nor neural foraminal narrowing.  C3-4: Moderate right subarticular broad-based disc protrusion. Uncovertebral hypertrophy and mild facet arthropathy. Very mild canal stenosis with moderate to severe right neural foraminal narrowing. No left neural foraminal narrowing.  C4-5: Minimal uncovertebral hypertrophy and mild facet arthropathy without canal stenosis. No disc bulge. Minimal left neural foraminal narrowing.  C5-6: Uncovertebral hypertrophy. No disc bulge. Moderate to severe right and moderate left facet arthropathy without canal stenosis. Mild to moderate bilateral neural foraminal narrowing.  C6-7: No disc bulge. Minimal uncovertebral hypertrophy without canal stenosis or neural foraminal narrowing. Mild facet arthropathy.  C7-T1: No disc bulge, canal stenosis nor neural foraminal narrowing.  IMPRESSION: Motion degraded examination without acute fracture nor malalignment. No convincing evidence of cervical spinal cord signal abnormality.  Degenerative change of the cervical spine with resultant very mild  canal stenosis at C3-4.  Neural foraminal narrowing C3-4 through C5-6: Moderate to severe on the right at C3-4.   Electronically Signed   By: Awilda Metroourtnay  Bloomer   On: 06/12/2013 22:22    Scheduled Meds: . folic acid  1 mg Oral Daily  . insulin aspart  0-5 Units Subcutaneous QHS  . insulin aspart  0-9 Units Subcutaneous TID WC  . lactulose  30 g Oral TID  . LORazepam  0-4 mg Oral Q12H  . pantoprazole  40 mg Oral Daily  . propranolol  10 mg Oral TID  . sodium chloride  3 mL Intravenous Q12H  . thiamine  100 mg Oral Daily   Or  . thiamine  100 mg Intravenous Daily   Continuous Infusions:   Principal  Problem:   Left arm weakness Active Problems:   Alcohol abuse   Thrombocytopenia, secondary   DM type 2 (diabetes mellitus, type 2)   Esophageal varices in alcoholic cirrhosis   Liver cirrhosis, alcoholic   Encephalopathy   S/P cholecystectomy   Splenomegaly    Time spent: > 35 minutes     [name] SheetsUlugbek N Paddy [name]  [facility] Pager [phone]. If 7PM-7AM, please contact night-coverage at [email or web address], password [email or web address] 06/14/2013, 1:40 PM  LOS: 3 days

## 2013-06-14 NOTE — Progress Notes (Signed)
CRITICAL VALUE ALERT  Critical value received:  Platelets=24  Date of notification:  06/14/13  Time of notification:  0530  Critical value read back:yes  Nurse who received alert:  Nedra HaiLee, RN  MD notified (1st page):  [phone]  Time of first page:  K.Kirby, NP (text page  MD notified (2nd page): N/A  Time of second page: N/A  Responding MD:    Time MD responded:

## 2013-06-14 NOTE — ED Provider Notes (Signed)
Medical screening examination/treatment/procedure(s) were performed by non-physician practitioner and as supervising physician I was immediately available for consultation/collaboration.   EKG Interpretation   Date/Time:  Monday June 11 2013 15:37:35 EDT Ventricular Rate:  98 PR Interval:  168 QRS Duration: 114 QT Interval:  352 QTC Calculation: 449 R Axis:   -25 Text Interpretation:  Normal sinus rhythm Incomplete right bundle branch  block Minimal voltage criteria for LVH, may be normal variant Borderline  ECG No significant change since last tracing Reconfirmed by [facility] CenterGHIM  MD,  MICHEAL ([id number]) on 06/14/2013 3:00:03 PM        Gavin PoundMichael Y. Tajuan Dufault, MD 06/14/13 1500

## 2013-06-15 DIAGNOSIS — F1020 Alcohol dependence, uncomplicated: Secondary | ICD-10-CM

## 2013-06-15 LAB — CBC
HEMATOCRIT: 44.9 % (ref 39.0–52.0)
Hemoglobin: 15.6 g/dL (ref 13.0–17.0)
MCH: 33.7 pg (ref 26.0–34.0)
MCHC: 34.7 g/dL (ref 30.0–36.0)
MCV: 97 fL (ref 78.0–100.0)
Platelets: 31 10*3/uL — ABNORMAL LOW (ref 150–400)
RBC: 4.63 MIL/uL (ref 4.22–5.81)
RDW: 14.8 % (ref 11.5–15.5)
WBC: 3.2 10*3/uL — AB (ref 4.0–10.5)

## 2013-06-15 LAB — BASIC METABOLIC PANEL
BUN: 5 mg/dL — AB (ref 6–23)
CHLORIDE: 104 meq/L (ref 96–112)
CO2: 23 meq/L (ref 19–32)
Calcium: 8.4 mg/dL (ref 8.4–10.5)
Creatinine, Ser: 0.48 mg/dL — ABNORMAL LOW (ref 0.50–1.35)
GFR calc Af Amer: >90 mL/min (ref >90)
GFR calc non Af Amer: >90 mL/min (ref >90)
Glucose, Bld: 120 mg/dL — ABNORMAL HIGH (ref 70–99)
Potassium: 3.5 mEq/L — ABNORMAL LOW (ref 3.7–5.3)
Sodium: 140 mEq/L (ref 137–147)

## 2013-06-15 LAB — GLUCOSE, CAPILLARY
GLUCOSE-CAPILLARY: 97 mg/dL (ref 70–99)
GLUCOSE-CAPILLARY: 180 mg/dL — AB (ref 70–99)
GLUCOSE-CAPILLARY: 161 mg/dL — AB (ref 70–99)
Glucose-Capillary: 163 mg/dL — ABNORMAL HIGH (ref 70–99)

## 2013-06-15 LAB — AMMONIA: AMMONIA: 101 umol/L — AB (ref 11–60)

## 2013-06-15 MED ORDER — SPIRONOLACTONE 25 MG PO TABS
25.0000 mg | ORAL_TABLET | Freq: Every day | ORAL | Status: DC
  Administered 2013-06-15 – 2013-06-17 (×3): 25 mg via ORAL
  Filled 2013-06-15 (×3): qty 1

## 2013-06-15 MED ORDER — LACTULOSE 10 GM/15ML PO SOLN
30.0000 g | Freq: Two times a day (BID) | ORAL | Status: DC
  Administered 2013-06-15 – 2013-06-17 (×4): 30 g via ORAL
  Filled 2013-06-15 (×5): qty 45

## 2013-06-15 NOTE — Progress Notes (Signed)
Pt transferred to 5W via wheelchair. Pt belongings and cell phone sent with him. Report called to RN, pt stable at time of transfer. [employer] notified of pt transfer

## 2013-06-15 NOTE — Consult Note (Signed)
Reason for Consult: Alcohol dependence Referring Physician:  Dr. Forde Radon [name] [name] is an 48 y.o. male.  History of Present Illness: Patient was seen and chart reviewed. Psychiatric consultation was requested for possible alcohol detox treatment while in the hospital. Patient reported he has been suffering with alcohol dependence over 25 years and has been receiving outpatient psychiatric and substance abuse treatment. Patient denies current symptoms of depression, anxiety and psychosis.   Medical history: Patient denied suicidal, homicidal ideation intention or plans. This is a 48 y.o. year old male with prior hx/o ETOH abuse, alcoholic cirrhosis, secondary thrombocytopenia, type 2 diabetes, esophageal varices presented with alcohol toxicity and encephalopathy. Patient daughter was at bedside and patient wanted Spanish translator during this meeting. Patient has moderate level of is English-speaking. Patient has episodes of nausea, vomiting and confusion for the last 48 hours but denied today. Patient had some chest discomfort, left arm numbness, abdominal pain. Patient also reports R arm/hand numbness/paresthesias over past week. Notable lab findings include alcohol level 395, glucose is 297-AG 17, AST 217, ALT 138, T. bili of 3.1. Ammonia level 89. Plt 37. Urinalysis with greater than thousand glucose and trace leukocytes. Troponin EKG within normal limits. Hemodynamic stable, though patient is noted to be transiently hypoxic and apneic. UDS WNL. Head CT and CXR WNL thus far.   Mental Status Examination: Patient appeared as per his stated age, and fairly groomed, and has good eye contact. Patient has depressed mood and his affect was constricted. He has normal rate, rhythm, and volume of speech. His thought process is linear and goal directed. Patient has denied suicidal, homicidal ideations, intentions or plans. Patient has no evidence of auditory or visual hallucinations, delusions, and paranoia.  Patient has fair insight judgment and impulse control.  Past Medical History  Diagnosis Date  . Shortness of breath   . Sleep apnea   . Diabetes mellitus without complication   . GERD (gastroesophageal reflux disease)   . H/O hiatal hernia   . Seizures   . Headache(784.0)   . No pertinent past medical history     Past Surgical History  Procedure Laterality Date  . Appendectomy    . No past surgeries    . Radiology with anesthesia  01/30/2012    Procedure: RADIOLOGY WITH ANESTHESIA;  Surgeon: Legrand Como T. Annamaria Boots, MD;  Location: [place];  Service: Radiology;  Laterality: N/A;    History reviewed. No pertinent family history.  Social History:  reports that he quit smoking about 5 years ago. His smoking use included Cigarettes. He has a 2.5 pack-year smoking history. He has never used smokeless tobacco. He reports that he does not drink alcohol or use illicit drugs.  Allergies: No Known Allergies  Medications: I have reviewed the patient's current medications.  Results for orders placed during the hospital encounter of 06/11/13 (from the past 48 hour(s))  GLUCOSE, CAPILLARY     Status: Abnormal   Collection Time    06/13/13  4:12 PM      Result Value Ref Range   Glucose-Capillary 180 (*) 70 - 99 mg/dL  GLUCOSE, CAPILLARY     Status: Abnormal   Collection Time    06/13/13  8:22 PM      Result Value Ref Range   Glucose-Capillary 200 (*) 70 - 99 mg/dL   Comment 1 Notify RN     Comment 2 Documented in Chart    GLUCOSE, CAPILLARY     Status: Abnormal   Collection Time  06/13/13 10:27 PM      Result Value Ref Range   Glucose-Capillary 148 (*) 70 - 99 mg/dL   Comment 1 Notify RN     Comment 2 Documented in Chart    COMPREHENSIVE METABOLIC PANEL     Status: Abnormal   Collection Time    06/14/13  3:28 AM      Result Value Ref Range   Sodium 142  137 - 147 mEq/L   Potassium 3.4 (*) 3.7 - 5.3 mEq/L   Comment: DELTA CHECK NOTED   Chloride 106  96 - 112 mEq/L   CO2 22  19 - 32  mEq/L   Glucose, Bld 208 (*) 70 - 99 mg/dL   BUN 4 (*) 6 - 23 mg/dL   Creatinine, Ser 0.47 (*) 0.50 - 1.35 mg/dL   Calcium 8.6  8.4 - 10.5 mg/dL   Total Protein 5.9 (*) 6.0 - 8.3 g/dL   Albumin 2.7 (*) 3.5 - 5.2 g/dL   AST 83 (*) 0 - 37 U/L   ALT 76 (*) 0 - 53 U/L   Alkaline Phosphatase 79  39 - 117 U/L   Total Bilirubin 3.7 (*) 0.3 - 1.2 mg/dL   GFR calc non Af Amer >90  >90 mL/min   GFR calc Af Amer >90  >90 mL/min   Comment: (NOTE)     The eGFR has been calculated using the CKD EPI equation.     This calculation has not been validated in all clinical situations.     eGFR's persistently <90 mL/min signify possible Chronic Kidney     Disease.  PROTIME-INR     Status: Abnormal   Collection Time    06/14/13  3:28 AM      Result Value Ref Range   Prothrombin Time 19.6 (*) 11.6 - 15.2 seconds   INR 1.71 (*) 0.00 - 1.49  APTT     Status: None   Collection Time    06/14/13  3:28 AM      Result Value Ref Range   aPTT 37  24 - 37 seconds   Comment:            IF BASELINE aPTT IS ELEVATED,     SUGGEST PATIENT RISK ASSESSMENT     BE USED TO DETERMINE APPROPRIATE     ANTICOAGULANT THERAPY.  AMMONIA     Status: Abnormal   Collection Time    06/14/13  3:28 AM      Result Value Ref Range   Ammonia 113 (*) 11 - 60 umol/L  CBC     Status: Abnormal   Collection Time    06/14/13  3:28 AM      Result Value Ref Range   WBC 2.3 (*) 4.0 - 10.5 K/uL   RBC 4.43  4.22 - 5.81 MIL/uL   Hemoglobin 15.2  13.0 - 17.0 g/dL   HCT 42.3  39.0 - 52.0 %   MCV 95.5  78.0 - 100.0 fL   MCH 34.3 (*) 26.0 - 34.0 pg   MCHC 35.9  30.0 - 36.0 g/dL   RDW 14.5  11.5 - 15.5 %   Platelets 24 (*) 150 - 400 K/uL   Comment: SPECIMEN CHECKED FOR CLOTS     REPEATED TO VERIFY     CONSISTENT WITH PREVIOUS RESULT     CRITICAL RESULT CALLED TO, READ BACK BY AND VERIFIED WITH:     L WORLEY,RN 06/14/13 0429 SHIPMAN M  GLUCOSE, CAPILLARY  Status: Abnormal   Collection Time    06/14/13  7:44 AM      Result Value  Ref Range   Glucose-Capillary 143 (*) 70 - 99 mg/dL  GLUCOSE, CAPILLARY     Status: Abnormal   Collection Time    06/14/13 11:51 AM      Result Value Ref Range   Glucose-Capillary 186 (*) 70 - 99 mg/dL  GLUCOSE, CAPILLARY     Status: Abnormal   Collection Time    06/14/13  4:48 PM      Result Value Ref Range   Glucose-Capillary 173 (*) 70 - 99 mg/dL   Comment 1 Documented in Chart     Comment 2 Notify RN    GLUCOSE, CAPILLARY     Status: Abnormal   Collection Time    06/14/13  9:39 PM      Result Value Ref Range   Glucose-Capillary 142 (*) 70 - 99 mg/dL  AMMONIA     Status: Abnormal   Collection Time    06/15/13  6:49 AM      Result Value Ref Range   Ammonia 101 (*) 11 - 60 umol/L  CBC     Status: Abnormal   Collection Time    06/15/13  6:49 AM      Result Value Ref Range   WBC 3.2 (*) 4.0 - 10.5 K/uL   RBC 4.63  4.22 - 5.81 MIL/uL   Hemoglobin 15.6  13.0 - 17.0 g/dL   HCT 44.9  39.0 - 52.0 %   MCV 97.0  78.0 - 100.0 fL   MCH 33.7  26.0 - 34.0 pg   MCHC 34.7  30.0 - 36.0 g/dL   RDW 14.8  11.5 - 15.5 %   Platelets 31 (*) 150 - 400 K/uL   Comment: CONSISTENT WITH PREVIOUS RESULT  BASIC METABOLIC PANEL     Status: Abnormal   Collection Time    06/15/13  6:49 AM      Result Value Ref Range   Sodium 140  137 - 147 mEq/L   Potassium 3.5 (*) 3.7 - 5.3 mEq/L   Chloride 104  96 - 112 mEq/L   CO2 23  19 - 32 mEq/L   Glucose, Bld 120 (*) 70 - 99 mg/dL   BUN 5 (*) 6 - 23 mg/dL   Creatinine, Ser 0.48 (*) 0.50 - 1.35 mg/dL   Calcium 8.4  8.4 - 10.5 mg/dL   GFR calc non Af Amer >90  >90 mL/min   GFR calc Af Amer >90  >90 mL/min   Comment: (NOTE)     The eGFR has been calculated using the CKD EPI equation.     This calculation has not been validated in all clinical situations.     eGFR's persistently <90 mL/min signify possible Chronic Kidney     Disease.  GLUCOSE, CAPILLARY     Status: None   Collection Time    06/15/13  8:23 AM      Result Value Ref Range    Glucose-Capillary 97  70 - 99 mg/dL  GLUCOSE, CAPILLARY     Status: Abnormal   Collection Time    06/15/13 12:03 PM      Result Value Ref Range   Glucose-Capillary 180 (*) 70 - 99 mg/dL    No results found.  Positive for depression, excessive alcohol consumption and sleep disturbance Blood pressure 147/68, pulse 67, temperature 98.1 F (36.7 C), temperature source Oral, resp. rate 18, height _0  (  1.803 m), weight 104.5 kg (230 lb 6.1 oz), SpO2 94.00%.   Assessment/Plan: Alcohol dependence  Recommendation: Patient does not meet criteria for acute psychiatric hospitalization for detox treatment as he has been detoxed while the medical floor Recommended residential substance abuse rehabilitation treatment program if patient is interested Appreciate psychiatric consultation and will sign off at this time  Durward Parcel 06/15/2013, 12:19 PM

## 2013-06-15 NOTE — Progress Notes (Signed)
PATIENT DETAILS Name: [name] [name] Age: 48 y.o. Sex: male Date of Birth: Jan 12, 1966 Admit Date: 06/11/2013 Admitting Physician Doree Albee, MD [id number], [name], MD  Subjective: No major complaints this am  Assessment/Plan: Principal Problem: Left arm weakness  - Neuro has completed a workup - MRI brain and cervical spine negative - no further evaluation felt to be needed - symptoms appear to have resolved. Neurology suspects a compression palsy -PT eval today  Active Problems: Alcohol abuse  -last drink per patient 8 days back -no active withdrawal symptoms -c/w Folate,Thiamine -Ativan per CIWA protocol -counseled extensively  Altered Mental Status -Hepatic encephalopathy versus simple alcohol intoxication  -given Cirrhosis-will continue with Lactulose-will decrease to BID given frequent loose stools -mental status much improved, and is awake and alert  Liver Cirrhosis -secondary to Etoh use -c/w Propranolol,Lactulose and restart Aldactone  Thrombocytopenia -suspect secondary to Etoh use -platelets slowly increasing-up to 31K-no signs of bleeding  Esophageal varices in alcoholic cirrhosis  - placed on Propranolol   Headache  - Resolved - MRI and CT of head without acute findings  Abdominal pain  - placed on antibiotics for presumed SBP but has no ascites on exam or on ultrasound -therefore Cefotaxine was d/c'd on 4/7 -PPI for acute gastritis which is more likely considering the site (epigastric) and the ETOH abuse  DM -A1C of 7.0 -c/w SSI while inpatient-limited options for outpatient therapy-given non compliance, ongoing ETOH use  Disposition: Remain inpatient  DVT Prophylaxis: SCD's  Code Status: Full code   Family Communication None at bedside  Procedures:  None  CONSULTS:  neurology  Time spent 40 minutes-which includes 50% of the time with face-to-face with patient/ family and coordinating care related to the above  assessment and plan.    MEDICATIONS: Scheduled Meds: . folic acid  1 mg Oral Daily  . insulin aspart  0-5 Units Subcutaneous QHS  . insulin aspart  0-9 Units Subcutaneous TID WC  . lactulose  30 g Oral TID  . LORazepam  0-4 mg Oral Q12H  . pantoprazole  40 mg Oral Daily  . propranolol  10 mg Oral TID  . sodium chloride  3 mL Intravenous Q12H  . thiamine  100 mg Oral Daily   Or  . thiamine  100 mg Intravenous Daily   Continuous Infusions:  PRN Meds:.acetaminophen, dextrose, ondansetron (ZOFRAN) IV, oxyCODONE  Antibiotics: Anti-infectives   Start     Dose/Rate Route Frequency Ordered Stop   06/11/13 2330  rifaximin (XIFAXAN) tablet 550 mg  Status:  Discontinued     550 mg Oral 2 times daily 06/11/13 2228 06/12/13 1648   06/11/13 2300  cefoTAXime (CLAFORAN) 2 g in dextrose 5 % 50 mL IVPB  Status:  Discontinued     2 g 100 mL/hr over 30 Minutes Intravenous 3 times per day 06/11/13 2242 06/12/13 1609       PHYSICAL EXAM: Vital signs in last 24 hours: Filed Vitals:   06/14/13 1105 06/14/13 1720 06/14/13 2101 06/15/13 0607  BP: 129/68 145/66 156/79 147/68  Pulse: 64 71 68 67  Temp: 98.5 F (36.9 C) 98.1 F (36.7 C) 98.4 F (36.9 C) 98.1 F (36.7 C)  TempSrc: Oral Oral Oral   Resp: 16 23 20 18   Height:      Weight:   104.5 kg (230 lb 6.1 oz) 104.5 kg (230 lb 6.1 oz)  SpO2: 98% 97% 95% 94%    Weight change: -1.1 kg (-2 lb 6.8  oz) Filed Weights   06/14/13 0400 06/14/13 2101 06/15/13 0607  Weight: 105.6 kg (232 lb 12.9 oz) 104.5 kg (230 lb 6.1 oz) 104.5 kg (230 lb 6.1 oz)   Body mass index is 32.15 kg/(m^2).   Gen Exam: Awake and alert with clear speech.   Neck: Supple, No JVD.   Chest: B/L Clear.   CVS: S1 S2 Regular, no murmurs.  Abdomen: soft, BS +, non tender, non distended.  Extremities: no edema, lower extremities warm to touch. Neurologic: Non Focal.   Skin: No Rash.  Wounds: N/A.    Intake/Output from previous day:  Intake/Output Summary (Last 24  hours) at 06/15/13 1059 Last data filed at 06/14/13 1700  Gross per 24 hour  Intake    795 ml  Output    150 ml  Net    645 ml     LAB RESULTS: CBC  Recent Labs Lab 06/11/13 2245 06/12/13 0405 06/13/13 0307 06/14/13 0328 06/15/13 0649  WBC 3.6* 3.8* 3.6* 2.3* 3.2*  HGB 15.9 15.0 15.3 15.2 15.6  HCT 44.6 42.8 43.8 42.3 44.9  PLT 35* 30* 26* 24* 31*  MCV 97.6 98.2 99.1 95.5 97.0  MCH 34.8* 34.4* 34.6* 34.3* 33.7  MCHC 35.7 35.0 34.9 35.9 34.7  RDW 14.8 14.8 14.6 14.5 14.8  LYMPHSABS  --  0.7 0.6*  --   --   MONOABS  --  0.4 0.5  --   --   EOSABS  --  0.1 0.1  --   --   BASOSABS  --  0.0 0.0  --   --     Chemistries   Recent Labs Lab 06/11/13 1547 06/11/13 2245 06/12/13 0405 06/13/13 0307 06/14/13 0328 06/15/13 0649  NA 144  --  144 139 142 140  K 3.8  --  3.7 4.4 3.4* 3.5*  CL 102  --  106 104 106 104  CO2 25  --  24 23 22 23   GLUCOSE 297*  --  170* 111* 208* 120*  BUN 3*  --  4* 5* 4* 5*  CREATININE 0.52 0.50 0.50 0.45* 0.47* 0.48*  CALCIUM 8.6  --  7.7* 8.2* 8.6 8.4  MG  --  1.7  --   --   --   --     CBG:  Recent Labs Lab 06/14/13 0744 06/14/13 1151 06/14/13 1648 06/14/13 2139 06/15/13 0823  GLUCAP 143* 186* 173* 142* 97    GFR Estimated Creatinine Clearance: 139 ml/min (by C-G formula based on Cr of 0.48).  Coagulation profile  Recent Labs Lab 06/14/13 0328  INR 1.71*    Cardiac Enzymes  Recent Labs Lab 06/11/13 1910 06/11/13 2245 06/12/13 0410  TROPONINI <0.30 <0.30 <0.30    No components found with this basename: POCBNP,  No results found for this basename: DDIMER,  in the last 72 hours No results found for this basename: HGBA1C,  in the last 72 hours No results found for this basename: CHOL, HDL, LDLCALC, TRIG, CHOLHDL, LDLDIRECT,  in the last 72 hours No results found for this basename: TSH, T4TOTAL, FREET3, T3FREE, THYROIDAB,  in the last 72 hours No results found for this basename: VITAMINB12, FOLATE, FERRITIN, TIBC,  IRON, RETICCTPCT,  in the last 72 hours No results found for this basename: LIPASE, AMYLASE,  in the last 72 hours  Urine Studies No results found for this basename: UACOL, UAPR, USPG, UPH, UTP, UGL, UKET, UBIL, UHGB, UNIT, UROB, ULEU, UEPI, UWBC, URBC, UBAC, CAST, CRYS, UCOM, BILUA,  in the last 72 hours  MICROBIOLOGY: Recent Results (from the past 240 hour(s))  URINE CULTURE     Status: None   Collection Time    06/11/13  7:12 PM      Result Value Ref Range Status   Specimen Description URINE, CLEAN CATCH   Final   Special Requests NONE   Final   Culture  Setup Time     Final   Value: 06/11/2013 20:46     Performed at [facility]   Colony Count     Final   Value: NO GROWTH     [facility]formed at [facility]   Culture     Final   Value: NO GROWTH     Performed at [facility]   Report Status 06/13/2013 FINAL   Final  URINE CULTURE     Status: None   Collection Time    06/11/13 11:59 PM      Result Value Ref Range Status   Specimen Description URINE, RANDOM   Final   Special Requests NONE   Final   Culture  Setup Time     Final   Value: 06/11/2013 01:34     Performed at [facility]   Colony Count     Final   Value: NO GROWTH     Performed at [facility]   Culture     Final   Value: NO GROWTH     Performed at [facility]   Report Status 06/13/2013 FINAL   Final  CULTURE, BLOOD (ROUTINE X 2)     Status: None   Collection Time    06/12/13 12:36 AM      Result Value Ref Range Status   Specimen Description BLOOD LEFT ANTECUBITAL   Final   Special Requests BOTTLES DRAWN AEROBIC ONLY 5CC   Final   Culture  Setup Time     Final   Value: 06/12/2013 04:53     Performed at [facility]   Culture     Final   Value:        BLOOD CULTURE RECEIVED NO GROWTH TO DATE CULTURE WILL BE HELD FOR 5 DAYS BEFORE ISSUING A FINAL NEGATIVE REPORT     Performed at [facility]   Report Status PENDING   Incomplete  CULTURE, BLOOD  (ROUTINE X 2)     Status: None   Collection Time    06/12/13 12:43 AM      Result Value Ref Range Status   Specimen Description BLOOD LEFT FOREARM   Final   Special Requests BOTTLES DRAWN AEROBIC AND ANAEROBIC 5CC EA   Final   Culture  Setup Time     Final   Value: 06/12/2013 04:53     Performed at [facility]   Culture     Final   Value:        BLOOD CULTURE RECEIVED NO GROWTH TO DATE CULTURE WILL BE HELD FOR 5 DAYS BEFORE ISSUING A FINAL NEGATIVE REPORT     Performed at [facility]   Report Status PENDING   Incomplete  MRSA PCR SCREENING     Status: None   Collection Time    06/12/13  1:54 PM      Result Value Ref Range Status   MRSA by PCR NEGATIVE  NEGATIVE Final   Comment:            The GeneXpert MRSA Assay (FDA  approved for NASAL specimens     only), is one component of a     comprehensive MRSA colonization     surveillance program. It is not     intended to diagnose MRSA     infection nor to guide or     monitor treatment for     MRSA infections.    RADIOLOGY STUDIES/RESULTS: Dg Chest 2 View  06/11/2013   CLINICAL DATA:  Alcohol intoxication.  Medical clinic.  EXAM: CHEST  2 VIEW  COMPARISON:  05/24/2013  FINDINGS: Cardiac silhouette is normal in size. Normal mediastinal and hilar contours. Lung volumes are low. There is also mild respiratory motion. Allowing for this, the lungs are clear. No pleural effusion or pneumothorax.  Bony thorax is intact.  IMPRESSION: No active cardiopulmonary disease.   Electronically Signed   By: Amie Portland M.D.   On: 06/11/2013 21:23   Dg Chest 2 View  05/24/2013   CLINICAL DATA:  Pain and swelling  EXAM: CHEST  2 VIEW  COMPARISON:  Prior radiograph from 10/21/2012  FINDINGS: The cardiac and mediastinal silhouettes are stable in size and contour, and remain within normal limits.  The lungs are normally inflated. No airspace consolidation, pleural effusion, or pulmonary edema is identified. There is no pneumothorax.  No  acute osseous abnormality identified. Multilevel degenerative changes noted within the visualized spine. Embolization coils overlie the upper abdomen, unchanged.  IMPRESSION: No active cardiopulmonary disease.   Electronically Signed   By: Rise Mu M.D.   On: 05/24/2013 04:45   Ct Head Wo Contrast  06/11/2013   CLINICAL DATA:  Status post fall  EXAM: CT HEAD WITHOUT CONTRAST  TECHNIQUE: Contiguous axial images were obtained from the base of the skull through the vertex without intravenous contrast.  COMPARISON:  Noncontrast CT scan of the brain dated January 27, 2012.  FINDINGS: The ventricles are normal in size and position. There is no intracranial hemorrhage nor intracranial mass effect. There is no evidence of an evolving ischemic infarction. The cerebellum and brainstem are normal in density. There are no abnormal intracranial calcifications.  At bone window settings there is no evidence of an acute skull fracture. The observed portions of the paranasal sinuses and mastoid air cells are clear.  IMPRESSION: There is no acute intracranial abnormality.   Electronically Signed   By: Omair  [place]   On: 06/11/2013 20:13   Mr Brain Wo Contrast  06/12/2013   CLINICAL DATA:  ALCOHOL INTOXICATION MEDICAL CLEARANCE  EXAM: MRI HEAD WITHOUT CONTRAST  TECHNIQUE: Multiplanar, multiecho pulse sequences of the brain and surrounding structures were obtained without intravenous contrast.  COMPARISON:  CT HEAD W/O CM dated 06/11/2013; MR HEAD WO/W CM dated 10/21/2012  FINDINGS: Moderately motion degraded examination. The ventricles and sulci are normal for patient's age. No abnormal parenchymal signal, mass lesions, mass effect. No reduced diffusion to suggest acute ischemia. No susceptibility artifact to suggest hemorrhage. Again noted is T1 shortening of the basal ganglia and to lesser extent thalamus which can be seen with chronic liver disease, similar.  No abnormal extra-axial fluid collections. No extra-axial  masses though, contrast enhanced sequences would be more sensitive. Normal major intracranial vascular flow voids seen at the skull base.  Ocular globes and orbital contents are unremarkable though not tailored for evaluation. No abnormal sellar expansion. Visualized paranasal sinuses and mastoid air cells are well-aerated. No suspicious calvarial bone marrow signal. No abnormal sellar expansion. Craniocervical junction maintained.  IMPRESSION: No acute intracranial process on this  moderately motion degraded examination.   Electronically Signed   By: Awilda Metro   On: 06/12/2013 00:10   Mr Cervical Spine Wo Contrast  06/12/2013   CLINICAL DATA:  Left arm weakness after a fall.  EXAM: MRI CERVICAL SPINE WITHOUT CONTRAST  TECHNIQUE: Multiplanar, multisequence MR imaging was performed. No intravenous contrast was administered.  COMPARISON:  MR HEAD W/O CM dated 06/11/2013; CT HEAD W/O CM dated 06/11/2013  FINDINGS: Cervical vertebral bodies appear intact and aligned with maintenance of the cervical lordosis. Intervertebral disc heights demonstrate normal morphology, there is slight decreased T2 signal most consistent with mild desiccation. No abnormal bone marrow signal, specifically no bright STIR signal to suggest acute osseous process.  Cervical spinal cord appears overall normal in morphology and signal characteristics, motion degrades sensitivity for subtle cord signal abnormality. No syrinx. No myelomalacia. Craniocervical junction appears intact. Included prevertebral and paraspinal soft tissues are nonsuspicious. Midline small posterior fossa arachnoid cyst versus mega cisterna magna.  Level by level evaluation (motion degraded axial sequences decrease evaluation):  C2-3: No disc bulge, canal stenosis nor neural foraminal narrowing.  C3-4: Moderate right subarticular broad-based disc protrusion. Uncovertebral hypertrophy and mild facet arthropathy. Very mild canal stenosis with moderate to severe right  neural foraminal narrowing. No left neural foraminal narrowing.  C4-5: Minimal uncovertebral hypertrophy and mild facet arthropathy without canal stenosis. No disc bulge. Minimal left neural foraminal narrowing.  C5-6: Uncovertebral hypertrophy. No disc bulge. Moderate to severe right and moderate left facet arthropathy without canal stenosis. Mild to moderate bilateral neural foraminal narrowing.  C6-7: No disc bulge. Minimal uncovertebral hypertrophy without canal stenosis or neural foraminal narrowing. Mild facet arthropathy.  C7-T1: No disc bulge, canal stenosis nor neural foraminal narrowing.  IMPRESSION: Motion degraded examination without acute fracture nor malalignment. No convincing evidence of cervical spinal cord signal abnormality.  Degenerative change of the cervical spine with resultant very mild canal stenosis at C3-4.  Neural foraminal narrowing C3-4 through C5-6: Moderate to severe on the right at C3-4.   Electronically Signed   By: Awilda Metro   On: 06/12/2013 22:22   US Abdomen Complete  06/12/2013   CLINICAL DATA:  Abdominal distention.  EXAM: ULTRASOUND ABDOMEN COMPLETE  COMPARISON:  CT of the abdomen and pelvis performed 01/29/2012  FINDINGS: Gallbladder:  Status post cholecystectomy. Gallbladder fossa not well assessed due to overlying bowel gas.  Common bile duct:  Not visualized.  Liver:  No dominant lesion identified. The liver is not well assessed due to diffuse heterogeneity and increased echogenicity. This reflects the patient's known hepatic cirrhosis.  IVC:  Not visualized.  Pancreas:  The visualized portions of the pancreas are grossly unremarkable.  Spleen:  15.3 cm in length, reflecting splenomegaly, with a somewhat bulky appearance.  Right Kidney:  Length: 13.8 cm. Echogenicity within normal limits. No mass or hydronephrosis visualized.  Left Kidney:  Length: 14.7 cm. Echogenicity within normal limits. No mass or hydronephrosis visualized.  Abdominal aorta:  No aneurysm  visualized. Difficult to fully characterize due to overlying bowel gas.  Other findings:  None.  IMPRESSION: Limited evaluation of the abdomen due to overlying bowel gas and attenuation from the liver.  1. Hepatic cirrhosis noted; no dominant mass seen, though evaluation of the liver is limited. 2. Status post cholecystectomy. 3. Splenomegaly again noted.   Electronically Signed   By: Roanna Raider M.D.   On: 06/12/2013 01:26    Hoy Fallert Levora Dredge, MD  [facility] Pager:[phone]  If 7PM-7AM, please contact  night-coverage [email or web address] Password [email or web address] 06/15/2013, 10:59 AM   LOS: 4 days

## 2013-06-15 NOTE — Progress Notes (Signed)
Pt transferred to unit from 3S. Pt A&O, stable, & call bell within reach. Will continue to monitor.

## 2013-06-15 NOTE — Evaluation (Signed)
Clinical/Bedside Swallow Evaluation Patient Details  Name: [name] [name] MRN: [id number] Date of Birth: [birth year]  Today's Date: 06/15/2013 Time: 1914-7829 SLP Time Calculation (min): 17 min  Past Medical History:  Past Medical History  Diagnosis Date  . Shortness of breath   . Sleep apnea   . Diabetes mellitus without complication   . GERD (gastroesophageal reflux disease)   . H/O hiatal hernia   . Seizures   . Headache(784.0)   . No pertinent past medical history    Past Surgical History:  Past Surgical History  Procedure Laterality Date  . Appendectomy    . No past surgeries    . Radiology with anesthesia  01/30/2012    Procedure: RADIOLOGY WITH ANESTHESIA;  Surgeon: Casimiro Needle T. Miles Costain, MD;  Location: [facility] OR;  Service: Radiology;  Laterality: N/A;   HPI:  48 y.o. male presenting on 06/11/2013 with a past medical history of Sleep apnea; Diabetes mellitus; GERD; hiatal hernia; and alcoholism, seizures who came to the hospital for c/o left arm weakness (8 days), headache because he was drinking too much alcohol. He was nauseated but had not had any vomiting. He was thought to have hepatic encephalopathy and started on lactulose. Neuro has completed a workup - MRI brain and cervical spine negative - no further evaluation felt to be needed - symptoms appear to have resolved   Assessment / Plan / Recommendation Clinical Impression  Pt demonstrates normal swallow function. Discussed esopahgeal precautions and foods to avoid if suffering from gastritis. No SLP f/u needed, will sign off.     Aspiration Risk  None    Diet Recommendation Regular;Thin liquid   Liquid Administration via: Cup;Straw Medication Administration: Whole meds with liquid Supervision: Patient able to self feed Postural Changes and/or Swallow Maneuvers: Seated upright 90 degrees;Upright 30-60 min after meal    Other  Recommendations Oral Care Recommendations: Oral care BID;Patient independent with oral care    Follow Up Recommendations  None    Frequency and Duration        Pertinent Vitals/Pain NA    SLP Swallow Goals     Swallow Study Prior Functional Status  Available Help at Discharge: Family    General HPI: 48 y.o. male presenting on 06/11/2013 with a past medical history of Sleep apnea; Diabetes mellitus; GERD; hiatal hernia; and alcoholism, seizures who came to the hospital for c/o left arm weakness (8 days), headache because he was drinking too much alcohol. He was nauseated but had not had any vomiting. He was thought to have hepatic encephalopathy and started on lactulose. Neuro has completed a workup - MRI brain and cervical spine negative - no further evaluation felt to be needed - symptoms appear to have resolved Type of Study: Bedside swallow evaluation Diet Prior to this Study: Regular;Thin liquids Temperature Spikes Noted: No Respiratory Status: Room air History of Recent Intubation: No Behavior/Cognition: Alert;Cooperative;Pleasant mood Oral Cavity - Dentition: Adequate natural dentition Self-Feeding Abilities: Able to feed self Patient Positioning: Upright in bed Baseline Vocal Quality: Clear Volitional Cough: Strong Volitional Swallow: Able to elicit    Oral/Motor/Sensory Function Overall Oral Motor/Sensory Function: Appears within functional limits for tasks assessed   Ice Chips     Thin Liquid Thin Liquid: Within functional limits Presentation: Cup;Straw;Self Fed    Nectar Thick Nectar Thick Liquid: Not tested   Honey Thick Honey Thick Liquid: Not tested   Puree Puree: Not tested   Solid   GO    Solid: Within functional limits  [name] [name], [place] CCC-SLP [phone]  [name] [name] 06/15/2013,3:52 PM

## 2013-06-15 NOTE — Evaluation (Addendum)
Physical Therapy Evaluation Patient Details Name: [name] [name] MRN: [id number] DOB: [birth year] Today's Date: 06/15/2013   History of Present Illness  48 y.o. year old male  adm  06/11/13 with prior hx/o ETOH abuse, alcoholic cirrhosis, secondary thrombocytopenia, type 2 diabetes, esophageal varices presented with alcohol toxicity and encephalopathy; also with LUE weakness  and parasthesias for 1 wk prior to adm; MRI spine grossly negative except "Neural foraminal narrowing C3-4 through C5-6: Moderate to severe on the right at C3-4."   Clinical Impression  Pt will benefit from PT in acute setting to address deficits below; Plan is for HHPT, pt may need RW depending on progress, limited activity tolerance on eval; Pt states his sister will be home with him;    Follow Up Recommendations Home health PT    Equipment Recommendations  Rolling walker with 5" wheels    Recommendations for Other Services       Precautions / Restrictions Precautions Precautions: Fall Precaution Comments: pt speaks some English, Spanish 1* language      Mobility  Bed Mobility               General bed mobility comments: pt sitting EOB on  arrival  Transfers Overall transfer level: Needs assistance Equipment used: Rolling walker (2 wheeled) Transfers: Sit to/from Stand (repeated x 2 for activity tol) Sit to Stand: Min guard         General transfer comment: cues for hand placement  Ambulation/Gait Ambulation/Gait assistance: Min assist;Min guard Ambulation Distance (Feet): 15 Feet Assistive device: Rolling walker (2 wheeled) Gait Pattern/deviations: Step-through pattern;Decreased stride length     General Gait Details: pt requires incr time, verbal and visual cues for RW safety and step length; pt reports fatigue and wants to go to chair  Stairs            Wheelchair Mobility    Modified Rankin (Stroke Patients Only)       Balance Overall balance assessment: Needs  assistance   Sitting balance-Leahy Scale: Good       Standing balance-Leahy Scale: Fair                               Pertinent Vitals/Pain C/o LUE  Pain and pain in chest RN aware    Home Living Family/patient expects to be discharged to:: Private residence Living Arrangements: Other relatives (sister and brother in law) Available Help at Discharge: Family             Additional Comments: unable to get full hx due to language barrier; no family present at time of eval    Prior Function Level of Independence: Independent               Hand Dominance        Extremity/Trunk Assessment   Upper Extremity Assessment: LUE deficits/detail;Defer to OT evaluation       LUE Deficits / Details: LUE weaker than right; grossly  3+ to 4/5   Lower Extremity Assessment: Overall WFL for tasks assessed      Cervical / Trunk Assessment: Normal  Communication   Communication: Prefers language other than English  Cognition Arousal/Alertness: Awake/alert Behavior During Therapy: WFL for tasks assessed/performed Overall Cognitive Status: Difficult to assess                      General Comments      Exercises  Assessment/Plan    PT Assessment Patient needs continued PT services  PT Diagnosis Difficulty walking   PT Problem List Decreased activity tolerance;Decreased balance;Decreased mobility;Decreased strength;Decreased knowledge of use of DME;Impaired sensation  PT Treatment Interventions DME instruction;Gait training;Stair training;Functional mobility training;Therapeutic activities;Therapeutic exercise;Patient/family education;Balance training   PT Goals (Current goals can be found in the Care Plan section) Acute Rehab PT Goals Patient Stated Goal: none stated PT Goal Formulation: With patient Time For Goal Achievement: 06/22/13 Potential to Achieve Goals: Good    Frequency Min 3X/week   Barriers to discharge         Co-evaluation               End of Session   Activity Tolerance: Patient limited by fatigue Patient left: in chair;with call bell/phone within reach           Time: 0948-1000 PT Time Calculation (min): 12 min   Charges:   PT Evaluation $Initial PT Evaluation Tier I: 1 Procedure PT Treatments $Gait Training: 8-22 mins   PT G Codes:          Caswell Corwinara N Kinzly Pierrelouis 06/15/2013, 1:11 PM

## 2013-06-16 LAB — GLUCOSE, CAPILLARY
GLUCOSE-CAPILLARY: 145 mg/dL — AB (ref 70–99)
GLUCOSE-CAPILLARY: 212 mg/dL — AB (ref 70–99)
Glucose-Capillary: 194 mg/dL — ABNORMAL HIGH (ref 70–99)
Glucose-Capillary: 198 mg/dL — ABNORMAL HIGH (ref 70–99)

## 2013-06-16 LAB — CBC
HCT: 45.4 % (ref 39.0–52.0)
Hemoglobin: 16.1 g/dL (ref 13.0–17.0)
MCH: 34.8 pg — ABNORMAL HIGH (ref 26.0–34.0)
MCHC: 35.5 g/dL (ref 30.0–36.0)
MCV: 98.1 fL (ref 78.0–100.0)
Platelets: 36 10*3/uL — ABNORMAL LOW (ref 150–400)
RBC: 4.63 MIL/uL (ref 4.22–5.81)
RDW: 14.6 % (ref 11.5–15.5)
WBC: 3.2 10*3/uL — AB (ref 4.0–10.5)

## 2013-06-16 LAB — BASIC METABOLIC PANEL
BUN: 5 mg/dL — ABNORMAL LOW (ref 6–23)
CO2: 20 mEq/L (ref 19–32)
Calcium: 8.8 mg/dL (ref 8.4–10.5)
Chloride: 101 mEq/L (ref 96–112)
Creatinine, Ser: 0.51 mg/dL (ref 0.50–1.35)
GFR calc Af Amer: >90 mL/min (ref >90)
Glucose, Bld: 147 mg/dL — ABNORMAL HIGH (ref 70–99)
Potassium: 3.8 mEq/L (ref 3.7–5.3)
SODIUM: 136 meq/L — AB (ref 137–147)

## 2013-06-16 LAB — VITAMIN B1: VITAMIN B1 (THIAMINE): 397 nmol/L — AB (ref 8–30)

## 2013-06-16 LAB — MAGNESIUM: MAGNESIUM: 1.5 mg/dL (ref 1.5–2.5)

## 2013-06-16 MED ORDER — MAGNESIUM SULFATE 40 MG/ML IJ SOLN
2.0000 g | Freq: Once | INTRAMUSCULAR | Status: AC
  Administered 2013-06-16: 2 g via INTRAVENOUS
  Filled 2013-06-16: qty 50

## 2013-06-16 MED ORDER — PROPRANOLOL HCL 10 MG PO TABS
10.0000 mg | ORAL_TABLET | Freq: Three times a day (TID) | ORAL | Status: DC
  Administered 2013-06-16 – 2013-06-17 (×3): 10 mg via ORAL
  Filled 2013-06-16 (×5): qty 1

## 2013-06-16 MED ORDER — CEFTRIAXONE SODIUM 1 G IJ SOLR
1.0000 g | INTRAMUSCULAR | Status: DC
  Administered 2013-06-16: 1 g via INTRAVENOUS
  Filled 2013-06-16 (×2): qty 10

## 2013-06-16 NOTE — Progress Notes (Addendum)
PATIENT DETAILS Name: [name] [name] Age: 48 y.o. Sex: male Date of Birth: 03-Jul-1965 Admit Date: 06/11/2013 Admitting Physician Doree Albee, MD [id number], [name], MD  Subjective: No major complaints this am-however did have is that of lightheadedness and dizziness earlier this morning.  Assessment/Plan: Principal Problem: Left arm weakness  - Neuro has completed a workup - MRI brain and cervical spine negative - no further evaluation felt to be needed - symptoms appear to have resolved. Neurology suspects a compression palsy -PT eval completed, recommendations of a home health PT  Active Problems: ? Gram-positive bacteremia - One set of blood cultures done on 06/12/13 positive for gram positive rods, repeat blood cultures today, await further culture data. Since afebrile, nontoxic looking, continue to monitor off antibiotics. This very well could be a contamination. 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 -PPI for acute gastritis which is more likely considering the site  (epigastric) and the ETOH abuse  DM -A1C of 7.0 -c/w SSI while inpatient-limited options for outpatient therapy-given non compliance, ongoing ETOH use  Lightheadedness/dizziness - Occurred on 4/11 AM-check orthostatics  Disposition: Remain inpatient  DVT Prophylaxis: SCD's  Code Status: Full code   Family Communication None at bedside  Procedures:  None  CONSULTS:  neurology   MEDICATIONS: Scheduled Meds: . cefTRIAXone (ROCEPHIN)  IV  1 g Intravenous Q24H  . folic acid  1 mg Oral Daily  . insulin aspart  0-5 Units Subcutaneous QHS  . insulin aspart  0-9 Units Subcutaneous TID WC  . lactulose  30 g Oral BID  . magnesium sulfate 1 - 4 g bolus IVPB  2 g Intravenous Once  . pantoprazole  40 mg Oral Daily  . propranolol  10 mg Oral TID  . sodium chloride  3 mL Intravenous Q12H  . spironolactone  25 mg Oral Daily  . thiamine  100 mg Oral Daily   Or  . thiamine  100 mg Intravenous Daily   Continuous Infusions:  PRN Meds:.acetaminophen, dextrose, ondansetron (ZOFRAN) IV, oxyCODONE  Antibiotics: Anti-infectives   Start     Dose/Rate Route Frequency Ordered Stop   06/16/13 0345  cefTRIAXone (ROCEPHIN) 1 g in dextrose 5 % 50 mL IVPB     1 g 100 mL/hr over 30 Minutes Intravenous Every 24 hours 06/16/13 0339     06/11/13 2330  rifaximin (XIFAXAN) tablet 550 mg  Status:  Discontinued     550 mg Oral 2 times daily 06/11/13 2228 06/12/13 1648   06/11/13 2300  cefoTAXime (CLAFORAN) 2 g in dextrose 5 % 50 mL IVPB  Status:  Discontinued     2 g 100 mL/hr over 30 Minutes Intravenous  3 times per day 06/11/13 2242 06/12/13 1609       PHYSICAL EXAM: Vital signs in last 24 hours: Filed Vitals:   06/15/13 2202 06/15/13 2330 06/16/13 0259 06/16/13 0537  BP: 127/66 122/63 158/87 120/72  Pulse: 80 72 68 72  Temp:  98.2 F (36.8 C)  98.2 F (36.8 C)  TempSrc:  Oral  Oral  Resp:  18 19 18   Height:      Weight:    106.1 kg (233 lb 14.5 oz)  SpO2:  95% 92% 94%    Weight  change: 1.6 kg (3 lb 8.4 oz) Filed Weights   06/14/13 2101 06/15/13 0607 06/16/13 0537  Weight: 104.5 kg (230 lb 6.1 oz) 104.5 kg (230 lb 6.1 oz) 106.1 kg (233 lb 14.5 oz)   Body mass index is 32.64 kg/(m^2).   Gen Exam: Awake and alert with clear speech.   Neck: Supple, No JVD.   Chest: B/L Clear.   CVS: S1 S2 Regular, no murmurs.  Abdomen: soft, BS +, non tender, non distended.  Extremities: no edema, lower extremities warm to touch. Neurologic: Non Focal.   Skin: No Rash.  Wounds: N/A.    Intake/Output from previous day:  Intake/Output Summary (Last 24 hours) at 06/16/13 1048 Last data filed at 06/16/13 0808  Gross per 24 hour  Intake   1476 ml  Output      0 ml  Net   1476 ml     LAB RESULTS: CBC  Recent Labs Lab 06/11/13 2245 06/12/13 0405 06/13/13 0307 06/14/13 0328 06/15/13 0649  WBC 3.6* 3.8* 3.6* 2.3* 3.2*  HGB 15.9 15.0 15.3 15.2 15.6  HCT 44.6 42.8 43.8 42.3 44.9  PLT 35* 30* 26* 24* 31*  MCV 97.6 98.2 99.1 95.5 97.0  MCH 34.8* 34.4* 34.6* 34.3* 33.7  MCHC 35.7 35.0 34.9 35.9 34.7  RDW 14.8 14.8 14.6 14.5 14.8  LYMPHSABS  --  0.7 0.6*  --   --   MONOABS  --  0.4 0.5  --   --   EOSABS  --  0.1 0.1  --   --   BASOSABS  --  0.0 0.0  --   --     Chemistries   Recent Labs Lab 06/11/13 2245 06/12/13 0405 06/13/13 0307 06/14/13 0328 06/15/13 0649 06/16/13 0615  NA  --  144 139 142 140 136*  K  --  3.7 4.4 3.4* 3.5* 3.8  CL  --  106 104 106 104 101  CO2  --  24 23 22 23 20   GLUCOSE  --  170* 111* 208* 120* 147*  BUN  --  4* 5* 4* 5* 5*  CREATININE 0.50 0.50 0.45* 0.47* 0.48* 0.51  CALCIUM  --  7.7* 8.2* 8.6 8.4 8.8  MG 1.7  --   --   --   --  1.5    CBG:  Recent Labs Lab 06/15/13 0823 06/15/13 1203 06/15/13 1712 06/15/13 2139 06/16/13 0802  GLUCAP 97 180* 163* 161* 145*    GFR Estimated Creatinine Clearance: 139.9 ml/min (by C-G formula based on Cr of 0.51).  Coagulation profile  Recent Labs Lab 06/14/13 0328  INR 1.71*      Cardiac Enzymes  Recent Labs Lab 06/11/13 1910 06/11/13 2245 06/12/13 0410  TROPONINI <0.30 <0.30 <0.30    No components found with this basename: POCBNP,  No results found for this basename: DDIMER,  in the last 72 hours No results found for this basename: HGBA1C,  in the last 72 hours No results found for this basename: CHOL, HDL, LDLCALC, TRIG, CHOLHDL, LDLDIRECT,  in the last 72 hours No results found for this basename: TSH, T4TOTAL, FREET3, T3FREE, THYROIDAB,  in the last 72 hours No results found for this basename: VITAMINB12, FOLATE, FERRITIN, TIBC, IRON, RETICCTPCT,  in the last 72 hours No results found for this basename: LIPASE, AMYLASE,  in the last 72 hours  Urine Studies No results found for this basename: UACOL, UAPR, USPG, UPH, UTP, UGL, UKET, UBIL, UHGB, UNIT, UROB, ULEU, UEPI, UWBC, URBC, UBAC, CAST, CRYS, UCOM, BILUA,  in the last 72 hours  MICROBIOLOGY: Recent Results (from the past 240 hour(s))  URINE CULTURE     Status: None   Collection Time    06/11/13  7:12 PM      Result Value Ref Range Status   Specimen Description URINE, CLEAN CATCH   Final   Special Requests NONE   Final   Culture  Setup Time     Final   Value: 06/11/2013 20:46     Performed at [facility] Count     Final   Value: NO GROWTH     Performed at [facility]   Culture     Final   Value: NO GROWTH     Performed at [facility]   Report Status 06/13/2013 FINAL   Final  URINE CULTURE     Status: None   Collection Time    06/11/13 11:59 PM      Result Value Ref Range Status   Specimen Description URINE, RANDOM   Final   Special Requests NONE   Final   Culture  Setup Time     Final   Value: 06/11/2013 01:34     Performed at [facility] Count     Final   Value: NO GROWTH     Performed at [facility]   Culture     Final   Value: NO GROWTH     Performed at [facility]   Report Status 06/13/2013 FINAL    Final  CULTURE, BLOOD (ROUTINE X 2)     Status: None   Collection Time    06/12/13 12:36 AM      Result Value Ref Range Status   Specimen Description BLOOD LEFT ANTECUBITAL   Final   Special Requests BOTTLES DRAWN AEROBIC ONLY 5CC   Final   Culture  Setup Time     Final   Value: 06/12/2013 04:53     Performed at [facility]   Culture     Final   Value:        BLOOD CULTURE RECEIVED NO GROWTH TO DATE CULTURE WILL BE HELD FOR 5 DAYS BEFORE ISSUING A FINAL NEGATIVE REPORT     Performed at [facility]   Report Status PENDING   Incomplete  CULTURE, BLOOD (ROUTINE X 2)     Status: None   Collection Time    06/12/13 12:43 AM      Result Value Ref Range Status   Specimen Description BLOOD LEFT FOREARM   Final   Special Requests BOTTLES DRAWN AEROBIC AND ANAEROBIC 5CC EA   Final   Culture  Setup Time     Final   Value: 06/12/2013 04:53     Performed at [facility]   Culture     Final   Value: GRAM POSITIVE RODS  Note: Gram Stain Report Called to,Read Back By and Verified With: VALERIE AT 0232 06/16/13 BY SNOLO     [facility]   Report Status PENDING   Incomplete  MRSA PCR SCREENING     Status: None   Collection Time    06/12/13  1:54 PM      Result Value Ref Range Status   MRSA by PCR NEGATIVE  NEGATIVE Final   Comment:            The GeneXpert MRSA Assay (FDA     approved for NASAL specimens     only), is one component of a     comprehensive MRSA colonization     surveillance program. It is not     intended to diagnose MRSA     infection nor to guide or     monitor treatment for     MRSA infections.    RADIOLOGY STUDIES/RESULTS: Dg Chest 2 View  06/11/2013   CLINICAL DATA:  Alcohol intoxication.  Medical clinic.  EXAM: CHEST  2 VIEW  COMPARISON:  05/24/2013  FINDINGS: Cardiac silhouette is normal in size. Normal mediastinal and hilar contours. Lung volumes are low. There is also mild respiratory motion. Allowing for this, the  lungs are clear. No pleural effusion or pneumothorax.  Bony thorax is intact.  IMPRESSION: No active cardiopulmonary disease.   Electronically Signed   By: Amie Portlandavid  Ormond M.D.   On: 06/11/2013 21:23   Dg Chest 2 View  05/24/2013   CLINICAL DATA:  Pain and swelling  EXAM: CHEST  2 VIEW  COMPARISON:  Prior radiograph from 10/21/2012  FINDINGS: The cardiac and mediastinal silhouettes are stable in size and contour, and remain within normal limits.  The lungs are normally inflated. No airspace consolidation, pleural effusion, or pulmonary edema is identified. There is no pneumothorax.  No acute osseous abnormality identified. Multilevel degenerative changes noted within the visualized spine. Embolization coils overlie the upper abdomen, unchanged.  IMPRESSION: No active cardiopulmonary disease.   Electronically Signed   By: Rise MuBenjamin  McClintock M.D.   On: 05/24/2013 04:45   Ct Head Wo Contrast  06/11/2013   CLINICAL DATA:  Status post fall  EXAM: CT HEAD WITHOUT CONTRAST  TECHNIQUE: Contiguous axial images were obtained from the base of the skull through the vertex without intravenous contrast.  COMPARISON:  Noncontrast CT scan of the brain dated January 27, 2012.  FINDINGS: The ventricles are normal in size and position. There is no intracranial hemorrhage nor intracranial mass effect. There is no evidence of an evolving ischemic infarction. The cerebellum and brainstem are normal in density. There are no abnormal intracranial calcifications.  At bone window settings there is no evidence of an acute skull fracture. The observed portions of the paranasal sinuses and mastoid air cells are clear.  IMPRESSION: There is no acute intracranial abnormality.   Electronically Signed   By: Tavis  SwazilandJordan   On: 06/11/2013 20:13   Mr Brain Wo Contrast  06/12/2013   CLINICAL DATA:  ALCOHOL INTOXICATION MEDICAL CLEARANCE  EXAM: MRI HEAD WITHOUT CONTRAST  TECHNIQUE: Multiplanar, multiecho pulse sequences of the brain and  surrounding structures were obtained without intravenous contrast.  COMPARISON:  CT HEAD W/O CM dated 06/11/2013; MR HEAD WO/W CM dated 10/21/2012  FINDINGS: Moderately motion degraded examination. The ventricles and sulci are normal for patient's age. No abnormal parenchymal signal, mass lesions, mass effect. No reduced diffusion to suggest acute ischemia. No susceptibility artifact to suggest hemorrhage. Again  noted is T1 shortening of the basal ganglia and to lesser extent thalamus which can be seen with chronic liver disease, similar.  No abnormal extra-axial fluid collections. No extra-axial masses though, contrast enhanced sequences would be more sensitive. Normal major intracranial vascular flow voids seen at the skull base.  Ocular globes and orbital contents are unremarkable though not tailored for evaluation. No abnormal sellar expansion. Visualized paranasal sinuses and mastoid air cells are well-aerated. No suspicious calvarial bone marrow signal. No abnormal sellar expansion. Craniocervical junction maintained.  IMPRESSION: No acute intracranial process on this moderately motion degraded examination.   Electronically Signed   By: Awilda Metro   On: 06/12/2013 00:10   Mr Cervical Spine Wo Contrast  06/12/2013   CLINICAL DATA:  Left arm weakness after a fall.  EXAM: MRI CERVICAL SPINE WITHOUT CONTRAST  TECHNIQUE: Multiplanar, multisequence MR imaging was performed. No intravenous contrast was administered.  COMPARISON:  MR HEAD W/O CM dated 06/11/2013; CT HEAD W/O CM dated 06/11/2013  FINDINGS: Cervical vertebral bodies appear intact and aligned with maintenance of the cervical lordosis. Intervertebral disc heights demonstrate normal morphology, there is slight decreased T2 signal most consistent with mild desiccation. No abnormal bone marrow signal, specifically no bright STIR signal to suggest acute osseous process.  Cervical spinal cord appears overall normal in morphology and signal characteristics,  motion degrades sensitivity for subtle cord signal abnormality. No syrinx. No myelomalacia. Craniocervical junction appears intact. Included prevertebral and paraspinal soft tissues are nonsuspicious. Midline small posterior fossa arachnoid cyst versus mega cisterna magna.  Level by level evaluation (motion degraded axial sequences decrease evaluation):  C2-3: No disc bulge, canal stenosis nor neural foraminal narrowing.  C3-4: Moderate right subarticular broad-based disc protrusion. Uncovertebral hypertrophy and mild facet arthropathy. Very mild canal stenosis with moderate to severe right neural foraminal narrowing. No left neural foraminal narrowing.  C4-5: Minimal uncovertebral hypertrophy and mild facet arthropathy without canal stenosis. No disc bulge. Minimal left neural foraminal narrowing.  C5-6: Uncovertebral hypertrophy. No disc bulge. Moderate to severe right and moderate left facet arthropathy without canal stenosis. Mild to moderate bilateral neural foraminal narrowing.  C6-7: No disc bulge. Minimal uncovertebral hypertrophy without canal stenosis or neural foraminal narrowing. Mild facet arthropathy.  C7-T1: No disc bulge, canal stenosis nor neural foraminal narrowing.  IMPRESSION: Motion degraded examination without acute fracture nor malalignment. No convincing evidence of cervical spinal cord signal abnormality.  Degenerative change of the cervical spine with resultant very mild canal stenosis at C3-4.  Neural foraminal narrowing C3-4 through C5-6: Moderate to severe on the right at C3-4.   Electronically Signed   By: Awilda Metro   On: 06/12/2013 22:22   US Abdomen Complete  06/12/2013   CLINICAL DATA:  Abdominal distention.  EXAM: ULTRASOUND ABDOMEN COMPLETE  COMPARISON:  CT of the abdomen and pelvis performed 01/29/2012  FINDINGS: Gallbladder:  Status post cholecystectomy. Gallbladder fossa not well assessed due to overlying bowel gas.  Common bile duct:  Not visualized.  Liver:  No  dominant lesion identified. The liver is not well assessed due to diffuse heterogeneity and increased echogenicity. This reflects the patient's known hepatic cirrhosis.  IVC:  Not visualized.  Pancreas:  The visualized portions of the pancreas are grossly unremarkable.  Spleen:  15.3 cm in length, reflecting splenomegaly, with a somewhat bulky appearance.  Right Kidney:  Length: 13.8 cm. Echogenicity within normal limits. No mass or hydronephrosis visualized.  Left Kidney:  Length: 14.7 cm. Echogenicity within normal limits. No mass or  hydronephrosis visualized.  Abdominal aorta:  No aneurysm visualized. Difficult to fully characterize due to overlying bowel gas.  Other findings:  None.  IMPRESSION: Limited evaluation of the abdomen due to overlying bowel gas and attenuation from the liver.  1. Hepatic cirrhosis noted; no dominant mass seen, though evaluation of the liver is limited. 2. Status post cholecystectomy. 3. Splenomegaly again noted.   Electronically Signed   By: Roanna Raider M.D.   On: 06/12/2013 01:26    Shanker Levora Dredge, MD  [facility] Pager:[phone]  If 7PM-7AM, please contact night-coverage [email or web address] Password [email or web address] 06/16/2013, 10:48 AM   LOS: 5 days

## 2013-06-16 NOTE — Progress Notes (Addendum)
RN called to patient's room due to patient complaints of feeling dizzy after urinating in the bathroom. Patient was assisted back to bed and vital signs taken. BP 158/87, HR 68, RR 19, and O2 92% on RA. RN called central telemetry to review last several minutes of patient's telemetry. Monitor tech informed RN that other than patient's PVCs increasing to approximately 10 PVCs in about the last 10 minutes, no other alarms were noted.Patient stated that after lying down the dizziness began to resolve. [employer] called [phone] [name] to ensure patient understood RN's assessment questions. Patient stated that he has felt similar symptoms at home before and on the first day of this admission. He stated that his dizziness was improving and that it only started after he voided. The patient did not complain of any other symptoms except a headache that he said he "has had for several days". Patient is resting at this time. On-call provider notified. Will continue to monitor.

## 2013-06-16 NOTE — Progress Notes (Signed)
[facility] lab notified RN that patient's blood cultures drawn on 4/7 were positive for gram positive rods. On-call provider paged to notify. Will continue to monitor.

## 2013-06-17 LAB — GLUCOSE, CAPILLARY
Glucose-Capillary: 105 mg/dL — ABNORMAL HIGH (ref 70–99)
Glucose-Capillary: 247 mg/dL — ABNORMAL HIGH (ref 70–99)

## 2013-06-17 LAB — CULTURE, BLOOD (ROUTINE X 2)

## 2013-06-17 MED ORDER — PROPRANOLOL HCL 10 MG PO TABS: 10.0000 mg | ORAL_TABLET | Freq: Three times a day (TID) | ORAL | Status: DC

## 2013-06-17 MED ORDER — THIAMINE HCL 100 MG PO TABS: 100.0000 mg | ORAL_TABLET | Freq: Every day | ORAL | Status: DC

## 2013-06-17 MED ORDER — LACTULOSE 10 GM/15ML PO SOLN: 30.0000 g | Freq: Two times a day (BID) | ORAL | Status: DC

## 2013-06-17 MED ORDER — SPIRONOLACTONE 25 MG PO TABS: 25.0000 mg | ORAL_TABLET | Freq: Every day | ORAL | Status: DC

## 2013-06-17 MED ORDER — FOLIC ACID 1 MG PO TABS: 1.0000 mg | ORAL_TABLET | Freq: Every day | ORAL | Status: DC

## 2013-06-17 NOTE — Discharge Summary (Signed)
PATIENT DETAILS Name: [name] [name] Age: 48 y.o. Sex: male Date of Birth: 06-25-1965 MRN: [id number]. Admit Date: 06/11/2013 Admitting Physician: Doree AlbeeSteven Newton, MD WGN:FAOZHYPCP:JEGEDE, Keane ScrapeLUGBEMIGA, MD  Recommendations for Outpatient Follow-up:  1. Follow blood cultures done on 4/11-negative  2. Follow CBC and BMET-has chronic thrombocytopenia 3. Counsel regarding alcohol use 4. If compliant to follow up and not drinking ETOH -consider starting oral hypoglycemics  PRIMARY DISCHARGE DIAGNOSIS:  Principal Problem:   Left arm weakness Active Problems:   Alcohol abuse   Thrombocytopenia, secondary   DM type 2 (diabetes mellitus, type 2)   Esophageal varices in alcoholic cirrhosis   Liver cirrhosis, alcoholic   Encephalopathy   S/P cholecystectomy   Splenomegaly      PAST MEDICAL HISTORY: Past Medical History  Diagnosis Date  . Shortness of breath   . Sleep apnea   . Diabetes mellitus without complication   . GERD (gastroesophageal reflux disease)   . H/O hiatal hernia   . Seizures   . Headache(784.0)   . No pertinent past medical history     DISCHARGE MEDICATIONS:   Medication List         folic acid 1 MG tablet  Commonly known as:  FOLVITE  Take 1 tablet (1 mg total) by mouth daily.     lactulose 10 GM/15ML solution  Commonly known as:  CHRONULAC  Take 45 mLs (30 g total) by mouth 2 (two) times daily.     propranolol 10 MG tablet  Commonly known as:  INDERAL  Take 1 tablet (10 mg total) by mouth 3 (three) times daily.     spironolactone 25 MG tablet  Commonly known as:  ALDACTONE  Take 1 tablet (25 mg total) by mouth daily.     thiamine 100 MG tablet  Take 1 tablet (100 mg total) by mouth daily.        ALLERGIES:  No Known Allergies  BRIEF HPI:  See H&P, Labs, Consult and Test reports for all details in 1brief,48 y.o. male presenting on 06/11/2013 with a past medical history of Sleep apnea; Diabetes mellitus; GERD; hiatal hernia; and alcoholism, seizures who  came to the hospital for c/o left arm weakness (8 days), headache because he was drinking too much alcohol. He was nauseated but had not had any vomiting. He was thought to have hepatic encephalopathy and started on lactulose.   CONSULTATIONS:   neurology  PERTINENT RADIOLOGIC STUDIES: Dg Chest 2 View  06/11/2013   CLINICAL DATA:  Alcohol intoxication.  Medical clinic.  EXAM: CHEST  2 VIEW  COMPARISON:  05/24/2013  FINDINGS: Cardiac silhouette is normal in size. Normal mediastinal and hilar contours. Lung volumes are low. There is also mild respiratory motion. Allowing for this, the lungs are clear. No pleural effusion or pneumothorax.  Bony thorax is intact.  IMPRESSION: No active cardiopulmonary disease.   Electronically Signed   By: Amie Portlandavid  Ormond M.D.   On: 06/11/2013 21:23   Dg Chest 2 View  05/24/2013   CLINICAL DATA:  Pain and swelling  EXAM: CHEST  2 VIEW  COMPARISON:  Prior radiograph from 10/21/2012  FINDINGS: The cardiac and mediastinal silhouettes are stable in size and contour, and remain within normal limits.  The lungs are normally inflated. No airspace consolidation, pleural effusion, or pulmonary edema is identified. There is no pneumothorax.  No acute osseous abnormality identified. Multilevel degenerative changes noted within the visualized spine. Embolization coils overlie the upper abdomen, unchanged.  IMPRESSION: No active cardiopulmonary disease.  Electronically Signed   By: Rise Mu M.D.   On: 05/24/2013 04:45   Ct Head Wo Contrast  06/11/2013   CLINICAL DATA:  Status post fall  EXAM: CT HEAD WITHOUT CONTRAST  TECHNIQUE: Contiguous axial images were obtained from the base of the skull through the vertex without intravenous contrast.  COMPARISON:  Noncontrast CT scan of the brain dated January 27, 2012.  FINDINGS: The ventricles are normal in size and position. There is no intracranial hemorrhage nor intracranial mass effect. There is no evidence of an evolving ischemic  infarction. The cerebellum and brainstem are normal in density. There are no abnormal intracranial calcifications.  At bone window settings there is no evidence of an acute skull fracture. The observed portions of the paranasal sinuses and mastoid air cells are clear.  IMPRESSION: There is no acute intracranial abnormality.   Electronically Signed   By: Lakeith  [place]   On: 06/11/2013 20:13   Mr Brain Wo Contrast  06/12/2013   CLINICAL DATA:  ALCOHOL INTOXICATION MEDICAL CLEARANCE  EXAM: MRI HEAD WITHOUT CONTRAST  TECHNIQUE: Multiplanar, multiecho pulse sequences of the brain and surrounding structures were obtained without intravenous contrast.  COMPARISON:  CT HEAD W/O CM dated 06/11/2013; MR HEAD WO/W CM dated 10/21/2012  FINDINGS: Moderately motion degraded examination. The ventricles and sulci are normal for patient's age. No abnormal parenchymal signal, mass lesions, mass effect. No reduced diffusion to suggest acute ischemia. No susceptibility artifact to suggest hemorrhage. Again noted is T1 shortening of the basal ganglia and to lesser extent thalamus which can be seen with chronic liver disease, similar.  No abnormal extra-axial fluid collections. No extra-axial masses though, contrast enhanced sequences would be more sensitive. Normal major intracranial vascular flow voids seen at the skull base.  Ocular globes and orbital contents are unremarkable though not tailored for evaluation. No abnormal sellar expansion. Visualized paranasal sinuses and mastoid air cells are well-aerated. No suspicious calvarial bone marrow signal. No abnormal sellar expansion. Craniocervical junction maintained.  IMPRESSION: No acute intracranial process on this moderately motion degraded examination.   Electronically Signed   By: Awilda Metro   On: 06/12/2013 00:10   Mr Cervical Spine Wo Contrast  06/12/2013   CLINICAL DATA:  Left arm weakness after a fall.  EXAM: MRI CERVICAL SPINE WITHOUT CONTRAST  TECHNIQUE:  Multiplanar, multisequence MR imaging was performed. No intravenous contrast was administered.  COMPARISON:  MR HEAD W/O CM dated 06/11/2013; CT HEAD W/O CM dated 06/11/2013  FINDINGS: Cervical vertebral bodies appear intact and aligned with maintenance of the cervical lordosis. Intervertebral disc heights demonstrate normal morphology, there is slight decreased T2 signal most consistent with mild desiccation. No abnormal bone marrow signal, specifically no bright STIR signal to suggest acute osseous process.  Cervical spinal cord appears overall normal in morphology and signal characteristics, motion degrades sensitivity for subtle cord signal abnormality. No syrinx. No myelomalacia. Craniocervical junction appears intact. Included prevertebral and paraspinal soft tissues are nonsuspicious. Midline small posterior fossa arachnoid cyst versus mega cisterna magna.  Level by level evaluation (motion degraded axial sequences decrease evaluation):  C2-3: No disc bulge, canal stenosis nor neural foraminal narrowing.  C3-4: Moderate right subarticular broad-based disc protrusion. Uncovertebral hypertrophy and mild facet arthropathy. Very mild canal stenosis with moderate to severe right neural foraminal narrowing. No left neural foraminal narrowing.  C4-5: Minimal uncovertebral hypertrophy and mild facet arthropathy without canal stenosis. No disc bulge. Minimal left neural foraminal narrowing.  C5-6: Uncovertebral hypertrophy. No disc bulge. Moderate to  severe right and moderate left facet arthropathy without canal stenosis. Mild to moderate bilateral neural foraminal narrowing.  C6-7: No disc bulge. Minimal uncovertebral hypertrophy without canal stenosis or neural foraminal narrowing. Mild facet arthropathy.  C7-T1: No disc bulge, canal stenosis nor neural foraminal narrowing.  IMPRESSION: Motion degraded examination without acute fracture nor malalignment. No convincing evidence of cervical spinal cord signal abnormality.   Degenerative change of the cervical spine with resultant very mild canal stenosis at C3-4.  Neural foraminal narrowing C3-4 through C5-6: Moderate to severe on the right at C3-4.   Electronically Signed   By: Awilda Metro   On: 06/12/2013 22:22   US Abdomen Complete  06/12/2013   CLINICAL DATA:  Abdominal distention.  EXAM: ULTRASOUND ABDOMEN COMPLETE  COMPARISON:  CT of the abdomen and pelvis performed 01/29/2012  FINDINGS: Gallbladder:  Status post cholecystectomy. Gallbladder fossa not well assessed due to overlying bowel gas.  Common bile duct:  Not visualized.  Liver:  No dominant lesion identified. The liver is not well assessed due to diffuse heterogeneity and increased echogenicity. This reflects the patient's known hepatic cirrhosis.  IVC:  Not visualized.  Pancreas:  The visualized portions of the pancreas are grossly unremarkable.  Spleen:  15.3 cm in length, reflecting splenomegaly, with a somewhat bulky appearance.  Right Kidney:  Length: 13.8 cm. Echogenicity within normal limits. No mass or hydronephrosis visualized.  Left Kidney:  Length: 14.7 cm. Echogenicity within normal limits. No mass or hydronephrosis visualized.  Abdominal aorta:  No aneurysm visualized. Difficult to fully characterize due to overlying bowel gas.  Other findings:  None.  IMPRESSION: Limited evaluation of the abdomen due to overlying bowel gas and attenuation from the liver.  1. Hepatic cirrhosis noted; no dominant mass seen, though evaluation of the liver is limited. 2. Status post cholecystectomy. 3. Splenomegaly again noted.   Electronically Signed   By: Roanna Raider M.D.   On: 06/12/2013 01:26     PERTINENT LAB RESULTS: CBC:  Recent Labs  06/15/13 0649 06/16/13 1034  WBC 3.2* 3.2*  HGB 15.6 16.1  HCT 44.9 45.4  PLT 31* 36*   CMET CMP     Component Value Date/Time   NA 136* 06/16/2013 0615   K 3.8 06/16/2013 0615   CL 101 06/16/2013 0615   CO2 20 06/16/2013 0615   GLUCOSE 147* 06/16/2013 0615    BUN 5* 06/16/2013 0615   CREATININE 0.51 06/16/2013 0615   CREATININE 0.48* 11/16/2012 1443   CALCIUM 8.8 06/16/2013 0615   PROT 5.9* 06/14/2013 0328   ALBUMIN 2.7* 06/14/2013 0328   AST 83* 06/14/2013 0328   ALT 76* 06/14/2013 0328   ALKPHOS 79 06/14/2013 0328   BILITOT 3.7* 06/14/2013 0328   GFRNONAA >90 06/16/2013 0615   GFRAA >90 06/16/2013 0615    GFR Estimated Creatinine Clearance: 137.4 ml/min (by C-G formula based on Cr of 0.51). No results found for this basename: LIPASE, AMYLASE,  in the last 72 hours No results found for this basename: CKTOTAL, CKMB, CKMBINDEX, TROPONINI,  in the last 72 hours No components found with this basename: POCBNP,  No results found for this basename: DDIMER,  in the last 72 hours No results found for this basename: HGBA1C,  in the last 72 hours No results found for this basename: CHOL, HDL, LDLCALC, TRIG, CHOLHDL, LDLDIRECT,  in the last 72 hours No results found for this basename: TSH, T4TOTAL, FREET3, T3FREE, THYROIDAB,  in the last 72 hours No results found for this  basename: VITAMINB12, FOLATE, FERRITIN, TIBC, IRON, RETICCTPCT,  in the last 72 hours Coags: No results found for this basename: PT, INR,  in the last 72 hours Microbiology: Recent Results (from the past 240 hour(s))  URINE CULTURE     Status: None   Collection Time    06/11/13  7:12 PM      Result Value Ref Range Status   Specimen Description URINE, CLEAN CATCH   Final   Special Requests NONE   Final   Culture  Setup Time     Final   Value: 06/11/2013 20:46     Performed at [facility] Count     Final   Value: NO GROWTH     Performed at [facility]   Culture     Final   Value: NO GROWTH     Performed at [facility]   Report Status 06/13/2013 FINAL   Final  URINE CULTURE     Status: None   Collection Time    06/11/13 11:59 PM      Result Value Ref Range Status   Specimen Description URINE, RANDOM   Final   Special Requests NONE   Final   Culture   Setup Time     Final   Value: 06/11/2013 01:34     Performed at [facility] Count     Final   Value: NO GROWTH     Performed at [facility]   Culture     Final   Value: NO GROWTH     Performed at [facility]   Report Status 06/13/2013 FINAL   Final  CULTURE, BLOOD (ROUTINE X 2)     Status: None   Collection Time    06/12/13 12:36 AM      Result Value Ref Range Status   Specimen Description BLOOD LEFT ANTECUBITAL   Final   Special Requests BOTTLES DRAWN AEROBIC ONLY 5CC   Final   Culture  Setup Time     Final   Value: 06/12/2013 04:53     Performed at [facility]   Culture     Final   Value:        BLOOD CULTURE RECEIVED NO GROWTH TO DATE CULTURE WILL BE HELD FOR 5 DAYS BEFORE ISSUING A FINAL NEGATIVE REPORT     Performed at [facility]   Report Status PENDING   Incomplete  CULTURE, BLOOD (ROUTINE X 2)     Status: None   Collection Time    06/12/13 12:43 AM      Result Value Ref Range Status   Specimen Description BLOOD LEFT FOREARM   Final   Special Requests BOTTLES DRAWN AEROBIC AND ANAEROBIC 5CC EA   Final   Culture  Setup Time     Final   Value: 06/12/2013 04:53     Performed at [facility]   Culture     Final   Value: PROPIONIBACTERIUM SPECIES     Note: Gram Stain Report Called to,Read Back By and Verified With: VALERIE AT 0232 06/16/13 BY SNOLO     Performed at [facility]   Report Status 06/17/2013 FINAL   Final  MRSA PCR SCREENING     Status: None   Collection Time    06/12/13  1:54 PM      Result Value Ref Range Status   MRSA by PCR NEGATIVE  NEGATIVE Final   Comment:  The GeneXpert MRSA Assay (FDA     approved for NASAL specimens     only), is one component of a     comprehensive MRSA colonization     surveillance program. It is not     intended to diagnose MRSA     infection nor to guide or     monitor treatment for     MRSA infections.  CULTURE, BLOOD (ROUTINE X 2)      Status: None   Collection Time    06/16/13 10:34 AM      Result Value Ref Range Status   Specimen Description BLOOD RIGHT ARM   Final   Special Requests BOTTLES DRAWN AEROBIC ONLY [facility]   Final   Culture  Setup Time     Final   Value: 06/16/2013 17:23     Performed at [facility]   Culture     Final   Value:        BLOOD CULTURE RECEIVED NO GROWTH TO DATE CULTURE WILL BE HELD FOR 5 DAYS BEFORE ISSUING A FINAL NEGATIVE REPORT     Performed at [facility]   Report Status PENDING   Incomplete  CULTURE, BLOOD (ROUTINE X 2)     Status: None   Collection Time    06/16/13 10:41 AM      Result Value Ref Range Status   Specimen Description BLOOD RIGHT HAND   Final   Special Requests BOTTLES DRAWN AEROBIC ONLY 6CC   Final   Culture  Setup Time     Final   Value: 06/16/2013 17:23     Performed at [facility]   Culture     Final   Value:        BLOOD CULTURE RECEIVED NO GROWTH TO DATE CULTURE WILL BE HELD FOR 5 DAYS BEFORE ISSUING A FINAL NEGATIVE REPORT     Performed at [facility]   Report Status PENDING   Incomplete     BRIEF HOSPITAL COURSE:  Left arm weakness  - Neuro has completed a workup - MRI brain and cervical spine negative - no further evaluation felt to be needed - symptoms appear to have resolved. Neurology suspects a compression palsy  -PT eval completed, recommendations are for home health PT   ? Gram-positive bacteremia  - One set of blood cultures done on 06/12/13 positive for gram positive rods, final culture results show PROPIONIBACTERIUM SPECIES, likely a contamination.Repeat blood culture on 4/11-negative so far (prelim-called Lab) -please follow culture results at follow up with PCP  Alcohol abuse  -last drink per patient 9 days back  -no active withdrawal symptoms  -c/w Folate,Thiamine  -treated with Ativan per [facility] protocol  -counseled extensively   Altered Mental Status  -Hepatic encephalopathy versus simple alcohol  intoxication  -given Cirrhosis-will continue with Lactulose-will decrease to BID given frequent loose stools  -mental status much improved, and is awake and alert, and stable for discharge  Liver Cirrhosis  -secondary to Etoh use  -c/w Propranolol,Lactulose and restart Aldactone. Optimize further in the outpatient setting  Thrombocytopenia  -suspect secondary to Etoh use  -platelets slowly increasing-up to 36K-no signs of bleeding. Please continue to monitor CBC in the outpatient setting.  Esophageal varices in alcoholic cirrhosis  - placed on Propranolol   Headache  - Resolved - MRI and CT of head without acute findings   Abdominal pain  - placed on antibiotics for presumed SBP but has no ascites on exam or on ultrasound -  therefore Cefotaxine was d/c'd on 4/7, Rocephin discontinued on 4/11.  -PPI for acute gastritis which is more likely considering the site (epigastric) and the ETOH abuse   DM  -A1C of 7.0  -managed with SSI while inpatient-limited options for outpatient therapy-given non compliance, ongoing ETOH use.If compliant to follow up and not drinking ETOH -consider starting oral hypoglycemics  Lightheadedness/dizziness  - Occurred on 4/11 AM- orthostatics negative. Symptoms have resolved.  TODAY-DAY OF DISCHARGE:  Subjective:   [name] [name] today has no headache,no chest abdominal pain,no new weakness tingling or numbness, feels much better wants to go home today.   Objective:   Blood pressure 97/60, pulse 62, temperature 98 F (36.7 C), temperature source Oral, resp. rate 20, height 5\' 11"  (1.803 m), weight 101.969 kg (224 lb 12.8 oz), SpO2 94.00%. No intake or output data in the 24 hours ending 06/17/13 1219 Filed Weights   06/15/13 0607 06/16/13 0537 06/17/13 0601  Weight: 104.5 kg (230 lb 6.1 oz) 106.1 kg (233 lb 14.5 oz) 101.969 kg (224 lb 12.8 oz)    Exam Awake Alert, Oriented *3, No new F.N deficits, Normal affect [place].AT,PERRAL Supple Neck,No JVD, No  cervical lymphadenopathy appriciated.  Symmetrical Chest wall movement, Good air movement bilaterally, CTAB RRR,No Gallops,Rubs or new Murmurs, No Parasternal Heave +ve B.Sounds, Abd Soft, Non tender, No organomegaly appriciated, No rebound -guarding or rigidity. No Cyanosis, Clubbing or edema, No new Rash or bruise  DISCHARGE CONDITION: Stable  DISPOSITION: Home with home health services  DISCHARGE INSTRUCTIONS:    Activity:  As tolerated with Full fall precautions use walker/cane & assistance as needed  Diet recommendation: Diabetic Diet Heart Healthy diet       Discharge Orders   Future Orders Complete By Expires   Diet - low sodium heart healthy  As directed    Diet Carb Modified  As directed    Increase activity slowly  As directed       Follow-up Information   Follow up with Jeanann Lewandowsky, MD. Schedule an appointment as soon as possible for a visit in 1 week.   Specialty:  Internal Medicine   Contact information:   [address] [place] [address] [phone]         Total Time spent on discharge equals 45 minutes.  Signed: Werner Lean Jairy Angulo 06/17/2013 12:19 PM

## 2013-06-17 NOTE — Progress Notes (Signed)
06/17/13 Patient going home today, IV site removed, Case manager to review for Home Health and needs.

## 2013-06-17 NOTE — Progress Notes (Signed)
PATIENT DETAILS Name: [name] [name] Age: 48 y.o. Sex: male Date of Birth: 15-Jul-1965 Admit Date: 06/11/2013 Admitting Physician Doree AlbeeSteven Newton, MD EAV:[id number], Keane ScrapeLUGBEMIGA, MD  Subjective: No major complaints this am-per RN ambulating independently this am.   Assessment/Plan: Principal Problem: Left arm weakness  - Neuro has completed a workup - MRI brain and cervical spine negative - no further evaluation felt to be needed - symptoms appear to have resolved. Neurology suspects a compression palsy -PT eval completed, recommendations are for home health PT  Active Problems: ? Gram-positive bacteremia - One set of blood cultures done on 06/12/13 positive for gram positive rods, repeat blood cultures today, await further culture data. Since afebrile, nontoxic looking, continue to monitor off antibiotics. This very well could be a contamination. 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 -PPI for acute gastritis which is more likely considering the site  (epigastric) and the ETOH abuse  DM -A1C of 7.0 -c/w SSI while inpatient-limited options for outpatient therapy-given non compliance, ongoing ETOH use  Lightheadedness/dizziness - Occurred on 4/11 AM-check orthostatics  Disposition: Remain inpatient-suspect home later today  DVT Prophylaxis: SCD's  Code Status: Full code   Family Communication None at bedside  Procedures:  None  CONSULTS:  neurology   MEDICATIONS: Scheduled Meds: . folic acid  1 mg Oral Daily  . insulin aspart  0-5 Units Subcutaneous QHS  . insulin aspart  0-9 Units Subcutaneous TID WC  . lactulose  30 g Oral BID  . pantoprazole  40 mg Oral Daily  . propranolol  10 mg Oral TID  . sodium chloride  3 mL Intravenous Q12H  . spironolactone  25 mg Oral Daily  . thiamine  100 mg Oral Daily   Or  . thiamine  100 mg Intravenous Daily   Continuous Infusions:  PRN Meds:.acetaminophen, dextrose, ondansetron (ZOFRAN) IV, oxyCODONE  Antibiotics: Anti-infectives   Start     Dose/Rate Route Frequency Ordered Stop   06/16/13 0345  cefTRIAXone (ROCEPHIN) 1 g in dextrose 5 % 50 mL IVPB  Status:  Discontinued     1 g 100 mL/hr over 30 Minutes Intravenous Every 24 hours 06/16/13 0339 06/16/13 1508   06/11/13 2330  rifaximin (XIFAXAN) tablet 550 mg  Status:  Discontinued     550 mg Oral 2 times daily 06/11/13 2228 06/12/13 1648   06/11/13 2300  cefoTAXime (CLAFORAN) 2 g in dextrose 5 % 50 mL IVPB  Status:  Discontinued     2 g 100 mL/hr over 30 Minutes Intravenous 3 times per day 06/11/13 2242 06/12/13 1609       PHYSICAL EXAM: Vital signs in  last 24 hours: Filed Vitals:   06/16/13 1119 06/16/13 2152 06/17/13 0030 06/17/13 0601  BP: 152/89 146/73 148/76 97/60  Pulse: 69 68 64 62  Temp:  98.5 F (36.9 C) 98.6 F (37 C) 98 F (36.7 C)  TempSrc:  Oral Oral Oral  Resp:  20 20 20   Height:      Weight:    101.969 kg (224 lb 12.8 oz)  SpO2:  92% 94% 94%    Weight change: -4.131 kg (-9 lb 1.7  oz) Filed Weights   06/15/13 0607 06/16/13 0537 06/17/13 0601  Weight: 104.5 kg (230 lb 6.1 oz) 106.1 kg (233 lb 14.5 oz) 101.969 kg (224 lb 12.8 oz)   Body mass index is 31.37 kg/(m^2).   Gen Exam: Awake and alert with clear speech.   Neck: Supple, No JVD.   Chest: B/L Clear.   CVS: S1 S2 Regular, no murmurs.  Abdomen: soft, BS +, non tender, non distended.  Extremities: no edema, lower extremities warm to touch. Neurologic: Non Focal.   Skin: No Rash.  Wounds: N/A.    Intake/Output from previous day:  Intake/Output Summary (Last 24 hours) at 06/17/13 1003 Last data filed at 06/16/13 1048  Gross per 24 hour  Intake     50 ml  Output      0 ml  Net     50 ml     LAB RESULTS: CBC  Recent Labs Lab 06/12/13 0405 06/13/13 0307 06/14/13 0328 06/15/13 0649 06/16/13 1034  WBC 3.8* 3.6* 2.3* 3.2* 3.2*  HGB 15.0 15.3 15.2 15.6 16.1  HCT 42.8 43.8 42.3 44.9 45.4  PLT 30* 26* 24* 31* 36*  MCV 98.2 99.1 95.5 97.0 98.1  MCH 34.4* 34.6* 34.3* 33.7 34.8*  MCHC 35.0 34.9 35.9 34.7 35.5  RDW 14.8 14.6 14.5 14.8 14.6  LYMPHSABS 0.7 0.6*  --   --   --   MONOABS 0.4 0.5  --   --   --   EOSABS 0.1 0.1  --   --   --   BASOSABS 0.0 0.0  --   --   --     Chemistries   Recent Labs Lab 06/11/13 2245 06/12/13 0405 06/13/13 0307 06/14/13 0328 06/15/13 0649 06/16/13 0615  NA  --  144 139 142 140 136*  K  --  3.7 4.4 3.4* 3.5* 3.8  CL  --  106 104 106 104 101  CO2  --  24 23 22 23 20   GLUCOSE  --  170* 111* 208* 120* 147*  BUN  --  4* 5* 4* 5* 5*  CREATININE 0.50 0.50 0.45* 0.47* 0.48* 0.51  CALCIUM  --  7.7* 8.2* 8.6 8.4 8.8  MG 1.7  --   --   --   --  1.5    CBG:  Recent Labs Lab 06/16/13 0802 06/16/13 1200 06/16/13 1639 06/16/13 2151 06/17/13 0746  GLUCAP 145* 194* 212* 198* 105*    GFR Estimated Creatinine Clearance: 137.4 ml/min (by C-G formula based on Cr of 0.51).  Coagulation profile  Recent Labs Lab 06/14/13 0328  INR 1.71*    Cardiac  Enzymes  Recent Labs Lab 06/11/13 1910 06/11/13 2245 06/12/13 0410  TROPONINI <0.30 <0.30 <0.30    No components found with this basename: POCBNP,  No results found for this basename: DDIMER,  in the last 72 hours No results found for this basename: HGBA1C,  in the last 72 hours No results found for this basename: CHOL,  HDL, LDLCALC, TRIG, CHOLHDL, LDLDIRECT,  in the last 72 hours No results found for this basename: TSH, T4TOTAL, FREET3, T3FREE, THYROIDAB,  in the last 72 hours No results found for this basename: VITAMINB12, FOLATE, FERRITIN, TIBC, IRON, RETICCTPCT,  in the last 72 hours No results found for this basename: LIPASE, AMYLASE,  in the last 72 hours  Urine Studies No results found for this basename: UACOL, UAPR, USPG, UPH, UTP, UGL, UKET, UBIL, UHGB, UNIT, UROB, ULEU, UEPI, UWBC, URBC, UBAC, CAST, CRYS, UCOM, BILUA,  in the last 72 hours  MICROBIOLOGY: Recent Results (from the past 240 hour(s))  URINE CULTURE     Status: None   Collection Time    06/11/13  7:12 PM      Result Value Ref Range Status   Specimen Description URINE, CLEAN CATCH   Final   Special Requests NONE   Final   Culture  Setup Time     Final   Value: 06/11/2013 20:46     Performed at [facility] Count     Final   Value: NO GROWTH     Performed at [facility]   Culture     Final   Value: NO GROWTH     Performed at [facility]   Report Status 06/13/2013 FINAL   Final  URINE CULTURE     Status: None   Collection Time    06/11/13 11:59 PM      Result Value Ref Range Status   Specimen Description URINE, RANDOM   Final   Special Requests NONE   Final   Culture  Setup Time     Final   Value: 06/11/2013 01:34     Performed at [facility] Count     Final   Value: NO GROWTH     Performed at [facility]   Culture     Final   Value: NO GROWTH     Performed at [facility]   Report Status 06/13/2013 FINAL   Final  CULTURE,  BLOOD (ROUTINE X 2)     Status: None   Collection Time    06/12/13 12:36 AM      Result Value Ref Range Status   Specimen Description BLOOD LEFT ANTECUBITAL   Final   Special Requests BOTTLES DRAWN AEROBIC ONLY 5CC   Final   Culture  Setup Time     Final   Value: 06/12/2013 04:53     Performed at [facility]   Culture     Final   Value:        BLOOD CULTURE RECEIVED NO GROWTH TO DATE CULTURE WILL BE HELD FOR 5 DAYS BEFORE ISSUING A FINAL NEGATIVE REPORT     Performed at [facility]   Report Status PENDING   Incomplete  CULTURE, BLOOD (ROUTINE X 2)     Status: None   Collection Time    06/12/13 12:43 AM      Result Value Ref Range Status   Specimen Description BLOOD LEFT FOREARM   Final   Special Requests BOTTLES DRAWN AEROBIC AND ANAEROBIC 5CC EA   Final   Culture  Setup Time     Final   Value: 06/12/2013 04:53     Performed at [facility]   Culture     Final   Value: GRAM POSITIVE RODS     Note: Gram Stain Report Called to,Read Back By and Verified  With: VALERIE AT 1610 06/16/13 BY SNOLO     Performed at [facility]   Report Status PENDING   Incomplete  MRSA PCR SCREENING     Status: None   Collection Time    06/12/13  1:54 PM      Result Value Ref Range Status   MRSA by PCR NEGATIVE  NEGATIVE Final   Comment:            The GeneXpert MRSA Assay (FDA     approved for NASAL specimens     only), is one component of a     comprehensive MRSA colonization     surveillance program. It is not     intended to diagnose MRSA     infection nor to guide or     monitor treatment for     MRSA infections.    RADIOLOGY STUDIES/RESULTS: Dg Chest 2 View  06/11/2013   CLINICAL DATA:  Alcohol intoxication.  Medical clinic.  EXAM: CHEST  2 VIEW  COMPARISON:  05/24/2013  FINDINGS: Cardiac silhouette is normal in size. Normal mediastinal and hilar contours. Lung volumes are low. There is also mild respiratory motion. Allowing for this, the lungs are clear. No  pleural effusion or pneumothorax.  Bony thorax is intact.  IMPRESSION: No active cardiopulmonary disease.   Electronically Signed   By: Amie Portland M.D.   On: 06/11/2013 21:23   Dg Chest 2 View  05/24/2013   CLINICAL DATA:  Pain and swelling  EXAM: CHEST  2 VIEW  COMPARISON:  Prior radiograph from 10/21/2012  FINDINGS: The cardiac and mediastinal silhouettes are stable in size and contour, and remain within normal limits.  The lungs are normally inflated. No airspace consolidation, pleural effusion, or pulmonary edema is identified. There is no pneumothorax.  No acute osseous abnormality identified. Multilevel degenerative changes noted within the visualized spine. Embolization coils overlie the upper abdomen, unchanged.  IMPRESSION: No active cardiopulmonary disease.   Electronically Signed   By: Rise Mu M.D.   On: 05/24/2013 04:45   Ct Head Wo Contrast  06/11/2013   CLINICAL DATA:  Status post fall  EXAM: CT HEAD WITHOUT CONTRAST  TECHNIQUE: Contiguous axial images were obtained from the base of the skull through the vertex without intravenous contrast.  COMPARISON:  Noncontrast CT scan of the brain dated January 27, 2012.  FINDINGS: The ventricles are normal in size and position. There is no intracranial hemorrhage nor intracranial mass effect. There is no evidence of an evolving ischemic infarction. The cerebellum and brainstem are normal in density. There are no abnormal intracranial calcifications.  At bone window settings there is no evidence of an acute skull fracture. The observed portions of the paranasal sinuses and mastoid air cells are clear.  IMPRESSION: There is no acute intracranial abnormality.   Electronically Signed   By: Ric  [place]   On: 06/11/2013 20:13   Mr Brain Wo Contrast  06/12/2013   CLINICAL DATA:  ALCOHOL INTOXICATION MEDICAL CLEARANCE  EXAM: MRI HEAD WITHOUT CONTRAST  TECHNIQUE: Multiplanar, multiecho pulse sequences of the brain and surrounding structures were  obtained without intravenous contrast.  COMPARISON:  CT HEAD W/O CM dated 06/11/2013; MR HEAD WO/W CM dated 10/21/2012  FINDINGS: Moderately motion degraded examination. The ventricles and sulci are normal for patient's age. No abnormal parenchymal signal, mass lesions, mass effect. No reduced diffusion to suggest acute ischemia. No susceptibility artifact to suggest hemorrhage. Again noted is T1 shortening of the basal ganglia and to  lesser extent thalamus which can be seen with chronic liver disease, similar.  No abnormal extra-axial fluid collections. No extra-axial masses though, contrast enhanced sequences would be more sensitive. Normal major intracranial vascular flow voids seen at the skull base.  Ocular globes and orbital contents are unremarkable though not tailored for evaluation. No abnormal sellar expansion. Visualized paranasal sinuses and mastoid air cells are well-aerated. No suspicious calvarial bone marrow signal. No abnormal sellar expansion. Craniocervical junction maintained.  IMPRESSION: No acute intracranial process on this moderately motion degraded examination.   Electronically Signed   By: Awilda Metro   On: 06/12/2013 00:10   Mr Cervical Spine Wo Contrast  06/12/2013   CLINICAL DATA:  Left arm weakness after a fall.  EXAM: MRI CERVICAL SPINE WITHOUT CONTRAST  TECHNIQUE: Multiplanar, multisequence MR imaging was performed. No intravenous contrast was administered.  COMPARISON:  MR HEAD W/O CM dated 06/11/2013; CT HEAD W/O CM dated 06/11/2013  FINDINGS: Cervical vertebral bodies appear intact and aligned with maintenance of the cervical lordosis. Intervertebral disc heights demonstrate normal morphology, there is slight decreased T2 signal most consistent with mild desiccation. No abnormal bone marrow signal, specifically no bright STIR signal to suggest acute osseous process.  Cervical spinal cord appears overall normal in morphology and signal characteristics, motion degrades sensitivity  for subtle cord signal abnormality. No syrinx. No myelomalacia. Craniocervical junction appears intact. Included prevertebral and paraspinal soft tissues are nonsuspicious. Midline small posterior fossa arachnoid cyst versus mega cisterna magna.  Level by level evaluation (motion degraded axial sequences decrease evaluation):  C2-3: No disc bulge, canal stenosis nor neural foraminal narrowing.  C3-4: Moderate right subarticular broad-based disc protrusion. Uncovertebral hypertrophy and mild facet arthropathy. Very mild canal stenosis with moderate to severe right neural foraminal narrowing. No left neural foraminal narrowing.  C4-5: Minimal uncovertebral hypertrophy and mild facet arthropathy without canal stenosis. No disc bulge. Minimal left neural foraminal narrowing.  C5-6: Uncovertebral hypertrophy. No disc bulge. Moderate to severe right and moderate left facet arthropathy without canal stenosis. Mild to moderate bilateral neural foraminal narrowing.  C6-7: No disc bulge. Minimal uncovertebral hypertrophy without canal stenosis or neural foraminal narrowing. Mild facet arthropathy.  C7-T1: No disc bulge, canal stenosis nor neural foraminal narrowing.  IMPRESSION: Motion degraded examination without acute fracture nor malalignment. No convincing evidence of cervical spinal cord signal abnormality.  Degenerative change of the cervical spine with resultant very mild canal stenosis at C3-4.  Neural foraminal narrowing C3-4 through C5-6: Moderate to severe on the right at C3-4.   Electronically Signed   By: Awilda Metro   On: 06/12/2013 22:22   US Abdomen Complete  06/12/2013   CLINICAL DATA:  Abdominal distention.  EXAM: ULTRASOUND ABDOMEN COMPLETE  COMPARISON:  CT of the abdomen and pelvis performed 01/29/2012  FINDINGS: Gallbladder:  Status post cholecystectomy. Gallbladder fossa not well assessed due to overlying bowel gas.  Common bile duct:  Not visualized.  Liver:  No dominant lesion identified. The  liver is not well assessed due to diffuse heterogeneity and increased echogenicity. This reflects the patient's known hepatic cirrhosis.  IVC:  Not visualized.  Pancreas:  The visualized portions of the pancreas are grossly unremarkable.  Spleen:  15.3 cm in length, reflecting splenomegaly, with a somewhat bulky appearance.  Right Kidney:  Length: 13.8 cm. Echogenicity within normal limits. No mass or hydronephrosis visualized.  Left Kidney:  Length: 14.7 cm. Echogenicity within normal limits. No mass or hydronephrosis visualized.  Abdominal aorta:  No aneurysm visualized. Difficult  to fully characterize due to overlying bowel gas.  Other findings:  None.  IMPRESSION: Limited evaluation of the abdomen due to overlying bowel gas and attenuation from the liver.  1. Hepatic cirrhosis noted; no dominant mass seen, though evaluation of the liver is limited. 2. Status post cholecystectomy. 3. Splenomegaly again noted.   Electronically Signed   By: Roanna Raider M.D.   On: 06/12/2013 01:26    Wm Fruchter Levora Dredge, MD  [facility] Pager:[phone]  If 7PM-7AM, please contact night-coverage [email or web address] Password [email or web address] 06/17/2013, 10:03 AM   LOS: 6 days

## 2013-06-17 NOTE — Progress Notes (Signed)
   CARE MANAGEMENT NOTE 06/17/2013  Patient:  [name] [name]   Account Number:  [id number]  Date Initiated:  06/17/2013  Documentation initiated by:  [facility]  Subjective/Objective Assessment:   adm: ETOH, encephalopathy     Action/Plan:   discharge planning   Anticipated DC Date:  06/17/2013   Anticipated DC Plan:  [place]  CM consult      Choice offered to / List presented to:             Status of service:  Completed, signed off Medicare Important Message given?   (If response is "NO", the following Medicare IM given date fields will be blank) Date Medicare IM given:   Date Additional Medicare IM given:    Discharge Disposition:  HOME/SELF CARE  Per UR Regulation:    If discussed at Long Length of Stay Meetings, dates discussed:    Comments:  06/17/13 14:00 CM met with pt in room to explain he would have to pay out of pocket for HHPT.  PT refused HHPT.  Pt states he is in the process of getting Medicaid but was not willing to risk paying out of pocket.  No other CM needs were communicated.  Mariane Masters, BSN, CM [phone].

## 2013-06-18 LAB — CULTURE, BLOOD (ROUTINE X 2): CULTURE: NO GROWTH

## 2013-06-22 LAB — CULTURE, BLOOD (ROUTINE X 2)
Culture: NO GROWTH
Culture: NO GROWTH

## 2013-07-24 ENCOUNTER — Encounter: Payer: Self-pay | Admitting: Internal Medicine

## 2013-07-24 ENCOUNTER — Ambulatory Visit: Payer: Self-pay | Attending: Internal Medicine | Admitting: Internal Medicine

## 2013-07-24 VITALS — BP 165/89 | HR 71 | Temp 98.3°F | Resp 14 | Ht 71.0 in | Wt 233.0 lb

## 2013-07-24 DIAGNOSIS — Z794 Long term (current) use of insulin: Secondary | ICD-10-CM | POA: Insufficient documentation

## 2013-07-24 DIAGNOSIS — K7030 Alcoholic cirrhosis of liver without ascites: Secondary | ICD-10-CM | POA: Insufficient documentation

## 2013-07-24 DIAGNOSIS — I10 Essential (primary) hypertension: Secondary | ICD-10-CM | POA: Insufficient documentation

## 2013-07-24 DIAGNOSIS — F1010 Alcohol abuse, uncomplicated: Secondary | ICD-10-CM | POA: Insufficient documentation

## 2013-07-24 DIAGNOSIS — IMO0001 Reserved for inherently not codable concepts without codable children: Secondary | ICD-10-CM | POA: Insufficient documentation

## 2013-07-24 DIAGNOSIS — E119 Type 2 diabetes mellitus without complications: Secondary | ICD-10-CM | POA: Insufficient documentation

## 2013-07-24 DIAGNOSIS — E1165 Type 2 diabetes mellitus with hyperglycemia: Principal | ICD-10-CM

## 2013-07-24 LAB — COMPLETE METABOLIC PANEL WITH GFR
ALBUMIN: 3.2 g/dL — AB (ref 3.5–5.2)
ALT: 24 U/L (ref 0–53)
AST: 31 U/L (ref 0–37)
Alkaline Phosphatase: 75 U/L (ref 39–117)
BUN: 6 mg/dL (ref 6–23)
CO2: 25 mEq/L (ref 19–32)
CREATININE: 0.53 mg/dL (ref 0.50–1.35)
Calcium: 8.6 mg/dL (ref 8.4–10.5)
Chloride: 108 mEq/L (ref 96–112)
GFR, Est Non African American: >89 mL/min
Glucose, Bld: 239 mg/dL — ABNORMAL HIGH (ref 70–99)
POTASSIUM: 3.8 meq/L (ref 3.5–5.3)
Sodium: 137 mEq/L (ref 135–145)
TOTAL PROTEIN: 6.1 g/dL (ref 6.0–8.3)
Total Bilirubin: 2.4 mg/dL — ABNORMAL HIGH (ref 0.2–1.2)

## 2013-07-24 LAB — GLUCOSE, POCT (MANUAL RESULT ENTRY): POC GLUCOSE: 337 mg/dL — AB (ref 70–99)

## 2013-07-24 MED ORDER — PROPRANOLOL HCL 10 MG PO TABS: 10.0000 mg | ORAL_TABLET | Freq: Three times a day (TID) | ORAL | Status: DC

## 2013-07-24 MED ORDER — DAPAGLIFLOZIN PROPANEDIOL 10 MG PO TABS: 10.0000 mg | ORAL_TABLET | Freq: Every day | ORAL | Status: DC

## 2013-07-24 MED ORDER — INSULIN ASPART 100 UNIT/ML ~~LOC~~ SOLN
10.0000 [IU] | Freq: Once | SUBCUTANEOUS | Status: AC
  Administered 2013-07-24: 10 [IU] via SUBCUTANEOUS

## 2013-07-24 MED ORDER — SPIRONOLACTONE 25 MG PO TABS: 25.0000 mg | ORAL_TABLET | Freq: Every day | ORAL | Status: DC

## 2013-07-24 MED ORDER — FUROSEMIDE 40 MG PO TABS: 40.0000 mg | ORAL_TABLET | Freq: Every day | ORAL | Status: DC

## 2013-07-24 MED ORDER — LACTULOSE 10 GM/15ML PO SOLN: 30.0000 g | Freq: Two times a day (BID) | ORAL | Status: DC

## 2013-07-24 MED ORDER — FOLIC ACID 1 MG PO TABS: 1.0000 mg | ORAL_TABLET | Freq: Every day | ORAL | Status: DC

## 2013-07-24 MED ORDER — THIAMINE HCL 100 MG PO TABS: 100.0000 mg | ORAL_TABLET | Freq: Every day | ORAL | Status: DC

## 2013-07-24 NOTE — Progress Notes (Signed)
Pt states that he is having reactions to medications; spots on his hand and painful urination. Pt reports having chest pain and having headaches. Pt also is having swelling of his legs. Pt has an interpreter.

## 2013-07-24 NOTE — Patient Instructions (Signed)
Cirrosis (Cirrhosis) Le han diagnosticado que padece cirrosis. Esta enfermedad consiste en un proceso de cicatrizacin del hgado, que se origina cuando este rgano trata de repararse despus sufrir un dao. El dao puede provenir de una infeccin previa, como la que se produce luego de una de las formas de la hepatitis (generalmente hepatitis C) o tambin por la accin de algunas toxinas. La principal toxina es el alcohol. La cicatrizacin del hgado que causa el alcohol es irreversible. Significa que el hgado no puede volver a su estado normal aunque no se consuma ms alcohol. El dao principal que causa la infeccin por hepatitis C es la enfermedad crnica (de larga duracin) del hgado, y tambin puede conducir a la cirrosis. Esta complicacin es progresiva e irreversible. CAUSAS Antes de disponer de las pruebas de [place], la hepatitis B poda contraerse por transfusiones de [place]ring[place]angre. Ahora es bastante improbable, ya que las pruebas han mejorado. Esta infeccin tambin puede adquirirse por el uso de drogas por va intravenosa y por compartir las agujas. Tambin puede contagiarse a travs de las The St. Paul Travelersrelaciones sexuales. La lesin que produce el alcohol se debe al Goodyear Tireexceso en el consumo. El hecho de beber algunos tragos no enfermar al hgado, sino que el riesgo aparece cuando el consumo es excesivo. Generalmente habr algunos signos y sntomas a comienzos del proceso de cicatrizacin del hgado que tendran que alertar para desarrollar mejores hbitos. El alcohol nunca debera consumirse si est tomando acetaminofeno. Pequeas dosis de ambos, consumidos en forma conjunta, pueden causar lesiones irreversibles en el hgado. INSTRUCCIONES PARA EL CUIDADO DOMICILIARIO No existe un tratamiento especfico para la cirrosis; sin embargo hay algunas cosas que usted puede hacer para evitar que la enfermedad empeore.  Descanse todo lo que pueda.  Consuma una dieta normal y bien balanceada. El profesional que lo asiste  podr darle algunas indicaciones.  Suplemento vitamnicos que incluyan vitaminas A, K, D y tiamina pueden [address] Floorayudar.  Una dieta baja en sal, restriccin de agua o diurticos podrn ser necesarios para reducir la retencin de lquidos.  Evite el alcohol. Puede ser extremadamente txico si lo combina con acetaminofeno.  Evite los frmacos que son txicos para el hgado. Entre ellas se incluyen: Isoniacida, metildopa acetaminofeno, esteroides anablicos (frmacos que energizan los msculos), eritromicina, y anticonceptivos orales (pldoras para el control de la natalidad). Realice un control con el profesional que lo asiste para comprobar que los medicamentos que est tomando no sern nocivos.  Podr requerir que se le practiquen pruebas de sangre peridicamente. Siga el consejo del profesional que lo asiste con respecto al momento en que deber Journalist, newspaperrealizarlas.  La leche de cardo es un remedio derivado de hierbas que protege al hgado de las toxinas; sin embargo, no ser de IXLutilidad. SOLICITE ATENCIN MDICA SI:   Presenta fatiga o debilidad en aumento.  Observa que se le Eli Lilly and Companyhinchan las manos, los pies, las piernas o el abdomen.  Tiene vmitos de sangre de color rojo brillante o semejante a la borra del caf.  Observa sangre en la materia fecal, o las heces se tornan negras y de aspecto alquitranado.  Tiene fiebre.  Ha perdido el apetito, siente nuseas o vmitos.  Desarrolla ictericia.  Se le forman moretones o tiene hemorragias con facilidad.  Alguno de los BorgWarnerproblemas que lo preocupaban, comienzan a Theme park managerempeorar. Document Released: 02/22/2005 Document Revised: 05/17/2011 [facility] Patient Information 2014 WalworthExitCare, [place]LLC. La dieta DASH  (DASH Diet) La dieta DASH (por su nombre en ingls) significa "Abordaje diettico para detener la hipertensin". Es un plan de  alimentacin saludable que ha demostrado reducir la presin arterial elevada (hipertensin) en tan solo [address] mientras probablemente  tambin ofrezca otros beneficios significativos para la salud. Estos otros beneficios incluyen la reduccin del riesgo de sufrir cncer de mama despus de la menopausia y de sufrir diabetes tipo 2, enfermedades cardacas, cncer de colon e ictus. Los beneficios para la salud tambin incluyen la prdida de peso y la disminucin del riesgo de insuficiencia renal en pacientes con enfermedad renal crnica.  GUAS PARA LA DIETA   Limite la cantidad de sal (sodio). Su dieta debe contener menos de 1500 mg de [employer].  Limite el consumo de carbohidratos refinados o procesados. Su dieta debe incluir principalmente granos enteros. Consuma postres con moderacin y limite el agregado de International aid/development worker.  Incluya pequeas cantidades de grasas saludables para el corazn. Estos tipos de grasas estn contenidas en las nueces, aceites y [place]. Limite las grasas saturadas y trans. Estas grasas han demostrado ser perjudiciales para el organismo. ELECCIN DE LOS ALIMENTOS  Los siguientes grupos de alimentos se basan en una dieta de 2000 caloras. Consulte a su nutricionista matriculado cules son sus necesidades calricas individuales.  Granos y productos elaborados con granos (6 a 8 porciones por Futures trader).   Consuma ms a menudo: Pan integral, arroz integral, pasta de trigo o de [place] integral, quinoa, palomitas de maz sin grasa o sal aadida (infladas).  Consuma con menos frecuencia:  Pan blanco, pastas blancas, arroz blanco, pan de maz. Verduras (4 a 5 porciones por Futures trader).   Consuma ms a menudo: Hortalizas frescas, congeladas y Primary school teacher. Puede consumir las hortalizas crudas, al vapor, asadas o grilladas con una mnima cantidad de grasas.  Consuma con menos frecuencia o evite: Vegetales con crema o fritos. Verduras en salsa de [place]. Frutas (4 a 5 porciones por da).  Consuma ms a menudo: Frutas frescas, en conserva (en su jugo natural) o frutas congeladas. Frutas secas sin el agregado de azcar. Cien por  ciento jugo de fruta ( taza [237 ml] por da).  Consuma con menos frecuencia: Frutas secas con azcar agregado. Fruta enlatada en almbar light o espeso. Carnes magras, pescado y aves de corral (2 porciones o Cabin crew. Una porcin es de 3 a 4 oz. [16-109 g]).  Consuma ms a menudo: Carne molida 90% magra o ms de lomo o solomillo . Cortes redondos de carne, [place] de pollo y de [place]. Todos los pescados. Prepare la carne al horno o asada a la parrilla o al grill. Nada debe ser frito.  Consuma con menos frecuencia o evite: Cortes grasos de carne, [place] o pata, muslo o ala de pollo. Cortes de carne o pescado fritos. Lcteos (2 a 3 porciones)  Consuma ms a menudo: La [employer] o con bajo contenido de grasa, yogur descremado o semidescremado, queso con bajo contenido en grasa o parcialmente descremado.  Consuma con menos frecuencia o evite: Leche (entera, al 2%). Yogur de [employer]. Quesos con toda su grasa. Nueces, semillas y legumbres (4 a 5 porciones por semana).  Consuma ms a menudo: Todos los alimentos sin sal agregada.  Consuma con menos frecuencia o evite: Nueces y semillas con sal, frijoles enlatados con sal agregada. Grasas y dulces (limitados)  Consuma ms a menudo: Public affairs consultant, margarinas en pote sin grasas trans, gelatina sin azcar. Mayonesa y aderezos para Solicitor.  Consuma con menos frecuencia o evite: Aceites de coco, aceites de palma, [place], margarina en barra, crema, [employer] y [address], [place], [place], [place].  Irven Shelling MS INFORMACIN  El Dash Diet Eating Plan (Plan Nutricional Dieta Dash): www.dashdiet.org  Document Released: 02/11/2011 Document Revised: 05/17/2011 [facility] Patient Information 2014 [place], [place]. Gua de planeamiento de la alimentacin para diabticos (Diabetes Meal Planning Guide) La gua de planeamiento de alimentacin para diabticos es una herramienta para ayudarlo a planear sus comidas y colaciones. Es  importante para las personas con diabetes controlar sus niveles de International aid/development worker. Elegir los [employer] correctos y las cantidades adecuadas durante el da le ayudar a Technical brewer. [place] bien puede incluso ayudarlo a mejorar la presin sangunea y Barista o Pharmacologist un peso saludable. CUENTE LOS HIDRATOS DE CARBONO CON FACILIDAD Cuando consume hidratos de carbono, stos se transforman en azcar (glucosa). Esto a su vez Counsellor de Production assistant, radio. El conteo de carbohidratos puede ayudarlo a Chief Operating Officer este nivel para que se sienta mejor. Al planear sus alimentos con el conteo de carbohidratos, podr tener ms flexibilidad en lo que come y Physiological scientist con el consumo de alimentos. El conteo de carbohidratos significa simplemente sumar la cantidad total de gramos de carbohidratos a sus comidas o colaciones. Trate de consumir la misma cantidad en cada comida. A continuacin encontrar una lista de 1 porcin o 15 gr. de carbohidratos. A continuacin se enumeran. Pregunte al mdico cuntos gramos de carbohidratos necesita comer en cada comida o colacin. Almidones y granos  1 [place] de pan.   bollo ingls o bollo para hamburguesa o hotdog.   taza de cereal fro (sin azcar).   taza de pasta o arroz cocido.   taza de vegetales que contengan almidn (maz, papas, arvejas, porotos, calabaza).  1 omelette (6 pulgadas).   bollo.  1 waffle o panqueque (del tamao de un CD).   taza de cereales cocidos.  4 a 6 galletas saldas pequeas. *Se recomienda el consumo de granos enteros. Frutas  1 taza de frutos rojos, meln, papaya o anan sin azcar.  1 fruta fresca pequea.   banana o mango.   taza de jugo de frutas (4 onzas sin endulzar).   taza de fruta envasada en jugo natural o agua.  2 cucharadas de frutas secas.  12-15 uvas o cerezas. Leche y yogurt  1 taza de [employer] o al 1%.  1 taza de leche de soja.  6 onzas de yogurt descremado  con edulcorante sin azcar.  6 onzas de yogur descremado de soja.  6 onzas de yogur natural. Vegetales  1 taza de vegetales crudos o  de vegetales cocidos se considera cero carbohidratos o una comida "libre".  Si come 3 o ms porciones en una comida, cuntelas como 1 porcin de carbohidratos. Otros carbohidratos   onzas de chips o pretzels.   taza de helado de crema o yogur helado.   taza de helado de agua.  5 cm de torta no congelada.  1 cucharada de miel, azcar, mermelada, jalea o almbar.  2 galletitas dulces pequeas.  3 cuadrados de crackers de graham.  3 tazas de palomitas de maz.  6 crackers.  1 taza de caldo.  Cuente 1 taza de guisado u otra mezcla de alimentos como 2 porciones de carbohidratos.  Los alimentos con menos de 20 caloras por porcin deben contarse como cero carbohidratos o alimento "libre". Si lo desea compre un libro o software de computacin que enumere la cantidad de gramos de carbohidratos de los diferentes alimentos. Adems, el panel nutricional en las etiquetas de los productos que consume es una buena fuente  de informacin. Le indicar el tamao de la porcin y la cantidad total de carbohidratos que consumir por cada una. Divida este nmero por 15 para obtener el nmero de conteo de carbohidratos por porcin. Recuerde: cada porcin son 3315 gramos de carbohidratos. PORCIONES La medicin de los alimentos y el tamao de las porciones lo ayudarn a Scientist, physiologicalcontrolar la cantidad exacta de comida que debe ingerir. La lista que sigue le mostrar el tamao de algunas porciones comunes.   1 onza.................4 dados apilados.  3 onzas..............Marland Kitchen.Un mazo de cartas.  1 cucharadita...Marland Kitchen.Marland Kitchen.La punta de un dedo pequeo.  1 cucharada.......Marland Kitchen.Un dedo.  2 cucharadas....Marland Kitchen.Marland Kitchen.Una pelota de golf.   taza..............Marland Kitchen.La mitad de un puo.  1 taza...............Marland Kitchen.Un puo. EJEMPLO DE PLAN DE ALIMENTACIN PARA DIABTICOS: A continuacin se muestra un ejemplo de  plan de alimentacin que incluye comidas de los grupos de granos y [place]fculas, Sports administratorvegetales, frutas y carnes. Un nutricionista podr confeccionarle un plan individualizado para cubrir sus necesidades calricas y decirle el nmero de porciones que necesita de [place]o Springscada grupo. Sin embargo, podra Pulte Homesintercambiar los alimentos que contengan carbohidratos (lcteos, cereales y frutas). Controlar la cantidad total de carbohidratos en los alimentos o colaciones es ms importante que asegurarse de incluir todos los grupos alimenticios cada vez que come.  El siguiente plan de alimentacin es un ejemplo de una dieta de 2000 caloras mediante el conteo de carbohidratos. Este plan contiene 17 porciones de carbohidratos. Desayuno  1 taza de avena (2 porciones de carbohidratos).   taza de yogur light(1 porcin de carbohidratos).  1 taza de arndanos (1 porcin de carbohidratos).   taza de almendras. Colacin  1 manzana grande (2 porciones de carbohidratos).  1 palito de queso bajo Fortune Brandsen grasa. Almuerzo  Ensalada de pechuga de pollo.  1 taza de espinacas.   taza de tomates cortados.  2 oz (60 gr) de pechuga de pollo en rebanadas.  2 cucharadas de aderezo italiano bajo en Avnetcontenido graso.  12 galletas integrales (2 porciones de carbohidratos).  12 a 15 uvas (1 porcin de carbohidratos).  1 taza de PPG Industriesleche descremada (1porcin de carbohidratos). Colacin  1 taza de zanahorias.   taza de pur de garbanzos (1 porcin de carbohidratos). Cena  3 oz (80 gr) de salmn a la parrilla.  1 taza de arroz integral (3 porciones de carbohidratos). Colacin  1  taza de brcoli al vapor (1 porcin de carbohidrato) con una cucharadita de aceite de oliva y jugo de limn.  1 taza de budn light (2 porciones de carbohidratos). HOJA DE PLANEAMIENTO DE LA ALIMENTACIN: El dietista podr utilizar esta hoja para ayudarlo a decidir cuntas porciones y qu tipos de alimentos son los adecuados para usted.  DESAYUNO Grupo  de alimentos y porciones / Alimento elegido Granos/Fculas_________________________________________________ Lcteos________________________________________________________ Rufina FalcoVegetales ______________________________________________________ Lou MinerFruta __________________________________________________________ Charlesetta Ivoryarnes _________________________________________________________ Rosalin HawkingGrasas _________________________________________________________ Lorin MercyALMUERZO Grupo de alimentos y porciones / Alimento elegido Granos/Fculas___________________________________________________ Lcteos_________________________________________________________ Lou MinerFruta ___________________________________________________________ Charlesetta Ivoryarnes __________________________________________________________ Rosalin HawkingGrasas __________________________________________________________ Danford BadENA Grupo de alimentos y porciones / Alimento elegido Granos/Fculas___________________________________________________ Lcteos_________________________________________________________ Lou MinerFruta ___________________________________________________________ Charlesetta Ivoryarnes __________________________________________________________ Rosalin HawkingGrasas __________________________________________________________ Jettie PaganOLACIN Grupo de alimentos y porciones / Alimento elegido Granos/Fculas_________________________________________________ Lcteos________________________________________________________ Rufina FalcoVegetales ______________________________________________________ Lou MinerFruta _________________________________________________________ Charlesetta Ivoryarnes ________________________________________________________ Rosalin HawkingGrasas ________________________________________________________ Carolyn StareTALES DIARIOS Fculas_______________________________________________________ Vegetales _____________________________________________________ Lou MinerFruta  ________________________________________________________ Lcteos_______________________________________________________ Carnes________________________________________________________ Rosalin HawkingGrasas ________________________________________________________ Document Released: 06/01/2007 Document Revised: 05/17/2011 ExitCare Patient Information 2014 [place] CreekExitCare, LLC.

## 2013-07-25 ENCOUNTER — Telehealth: Payer: Self-pay | Admitting: Emergency Medicine

## 2013-07-25 LAB — CORTISOL: CORTISOL PLASMA: 7.6 ug/dL

## 2013-07-25 NOTE — Telephone Encounter (Signed)
Message copied by Darlis LoanSMITH, Acelin Ferdig D on Wed Jul 25, 2013  3:14 PM ------      Message from: Jeanann LewandowskyJEGEDE, OLUGBEMIGA E      Created: Wed Jul 25, 2013  1:32 PM       Please inform patient that his lab results show high blood glucose and evidence of liver damage. We advised that he continues blood sugar control with the new medication, do not take Tylenol, do not drink alcohol. We will continue to monitor his parameters. ------

## 2013-07-25 NOTE — Telephone Encounter (Signed)
Pt given lab results with instructions not to take Tylenol or drink alcohol  Pacific interpretor used for [place]Spanish communication

## 2013-09-24 ENCOUNTER — Ambulatory Visit: Payer: Self-pay | Admitting: Internal Medicine

## 2013-09-30 NOTE — Progress Notes (Signed)
Patient ID: [name] BearDavid Mosley, male   DOB: 1965/12/09, 48 y.o.   MRN: [id number]   [name] BearDavid Sames, is a 48 y.o. male  [id number]  [id number]  DOB - 1965/12/09  Chief Complaint  Patient presents with  . Follow-up        Subjective:   [name] [name] is a 48 y.o. male here today for a follow up visit. Patient has extensive medical history of ETOH abuse, alcoholic cirrhosis, secondary thrombocytopenia, type 2 diabetes, esophageal varices, recently seen in the ER  with multiple complaints including dizziness, syncopal episodes, vomiting, confusion. Patient states he's had some chest discomfort, left arm numbness, abdominal pain. Has had some posterior headache. Pt also reports R arm/hand numbness/paresthesias over past week. He was managed and discharged to followup in our clinic today. Pt states that he is having reactions to medications; spots on his hand and painful urination. Pt also is having swelling of his legs.   Patient has No Nausea, No new weakness tingling or numbness, No Cough - SOB.  ALLERGIES: No Known Allergies  PAST MEDICAL HISTORY: Past Medical History  Diagnosis Date  . Shortness of breath   . Sleep apnea   . Diabetes mellitus without complication   . GERD (gastroesophageal reflux disease)   . H/O hiatal hernia   . Seizures   . Headache(784.0)   . No pertinent past medical history     MEDICATIONS AT HOME: Prior to Admission medications   Medication Sig Start Date End Date Taking? Authorizing Provider  Dapagliflozin Propanediol (FARXIGA) 10 MG TABS Take 10 mg by mouth daily. 07/24/13   Jeanann Lewandowskylugbemiga Aviv Rota, MD  folic acid (FOLVITE) 1 MG tablet Take 1 tablet (1 mg total) by mouth daily. 07/24/13   Jeanann Lewandowskylugbemiga Kamaury Cutbirth, MD  furosemide (LASIX) 40 MG tablet Take 1 tablet (40 mg total) by mouth daily. 07/24/13   Jeanann Lewandowskylugbemiga Shakerria Parran, MD  lactulose (CHRONULAC) 10 GM/15ML solution Take 45 mLs (30 g total) by mouth 2 (two) times daily. 07/24/13   Jeanann Lewandowskylugbemiga Fabienne Nolasco, MD    propranolol (INDERAL) 10 MG tablet Take 1 tablet (10 mg total) by mouth 3 (three) times daily. 07/24/13   Jeanann Lewandowskylugbemiga Tamar Lipscomb, MD  spironolactone (ALDACTONE) 25 MG tablet Take 1 tablet (25 mg total) by mouth daily. 07/24/13   Jeanann Lewandowskylugbemiga Duvid Smalls, MD  thiamine 100 MG tablet Take 1 tablet (100 mg total) by mouth daily. 07/24/13   Jeanann Lewandowskylugbemiga Azelyn Batie, MD     Objective:   Filed Vitals:   07/24/13 0945  BP: 165/89  Pulse: 71  Temp: 98.3 F (36.8 C)  TempSrc: Oral  Resp: 14  Height: 5\' 11"  (1.803 m)  Weight: 233 lb (105.688 kg)  SpO2: 95%    Exam General appearance : Awake, alert, not in any distress. Speech Clear. Not toxic looking HEENT: Atraumatic and Normocephalic, pupils equally reactive to light and accomodation Neck: supple, no JVD. No cervical lymphadenopathy.  Chest:Good air entry bilaterally, no added sounds  CVS: S1 S2 regular, no murmurs.  Abdomen: Bowel sounds present, Non tender and not distended with no gaurding, rigidity or rebound. Extremities: B/L Lower Ext shows no edema, both legs are warm to touch Neurology: Awake alert, and oriented X 3, CN II-XII intact, Non focal Skin:No Rash Wounds:N/A  Data Review Lab Results  Component Value Date   HGBA1C 7.0* 06/11/2013   HGBA1C 5.9* 10/21/2012     Assessment & Plan   1. Diabetes uncontrolled  - insulin aspart (novoLOG) injection 10 Units; Inject 0.1 mLs (10 Units total) into the skin  once. - Glucose (CBG) Add - Dapagliflozin Propanediol (FARXIGA) 10 MG TABS; Take 10 mg by mouth daily.  Dispense: 30 tablet; Refill: 6  2. Liver cirrhosis, alcoholic  - furosemide (LASIX) 40 MG tablet; Take 1 tablet (40 mg total) by mouth daily.  Dispense: 90 tablet; Refill: 3 - folic acid (FOLVITE) 1 MG tablet; Take 1 tablet (1 mg total) by mouth daily.  Dispense: 90 tablet; Refill: 3 - lactulose (CHRONULAC) 10 GM/15ML solution; Take 45 mLs (30 g total) by mouth 2 (two) times daily.  Dispense: 240 mL; Refill: 3 - propranolol (INDERAL)  10 MG tablet; Take 1 tablet (10 mg total) by mouth 3 (three) times daily.  Dispense: 90 tablet; Refill: 3 Add- spironolactone (ALDACTONE) 25 MG tablet; Take 1 tablet (25 mg total) by mouth daily.  Dispense: 90 tablet; Refill: 3 - thiamine 100 MG tablet; Take 1 tablet (100 mg total) by mouth daily.  Dispense: 90 tablet; Refill: 3  3. HTN (hypertension)  - furosemide (LASIX) 40 MG tablet; Take 1 tablet (40 mg total) by mouth daily.  Dispense: 90 tablet; Refill: 3 - propranolol (INDERAL) 10 MG tablet; Take 1 tablet (10 mg total) by mouth 3 (three) times daily.  Dispense: 90 tablet; Refill: 3 DASH diet  Labs: - Cortisol - COMPLETE METABOLIC PANEL WITH GFR  Patient was extensively counseled about nutrition and exercise Return in about 2 months (around 09/23/2013), or if symptoms worsen or fail to improve, for Hemoglobin A1C and Follow up, DM, , Diabetic Management/Courteny.  The patient was given clear instructions to go to ER or return to medical center if symptoms don't improve, worsen or new problems develop. The patient verbalized understanding. The patient was told to call to get lab results if they haven't heard anything in the next week.   This note has been created with Education officer, environmental. Any transcriptional errors are unintentional.    Jeanann Lewandowsky, MD, MHA, FACP, FAAP [facility] [place], [place] [phone]

## 2013-10-11 ENCOUNTER — Ambulatory Visit: Payer: Self-pay | Admitting: Internal Medicine

## 2013-12-18 ENCOUNTER — Ambulatory Visit (INDEPENDENT_AMBULATORY_CARE_PROVIDER_SITE_OTHER): Payer: Self-pay | Admitting: Internal Medicine

## 2013-12-18 VITALS — BP 136/80 | HR 78 | Temp 98.2°F | Resp 17 | Ht 72.0 in | Wt 244.0 lb

## 2013-12-18 DIAGNOSIS — L989 Disorder of the skin and subcutaneous tissue, unspecified: Secondary | ICD-10-CM

## 2013-12-18 NOTE — Progress Notes (Signed)
Procedure: Consent obtained.  1% Lidocaine with epi for each lesion for anesthesia.  Left side of forehead lesion removed by shave.  2 Lesions of right forearm with shaved excision.  3 lesions at right forearm by shave excision.  1 right side of forehead cut with excised with surgical scissors.  2 Lesions on right maxillary excised with surgical scissors.  Superior lesion of these two were removed for biopsy.  Drysol applied to control mild bleeding of both excision sites. 1 Lesion at left lateral mandible removed with surgical scissors.  Drysol applied to control mild bleeding.  1 lesion submandible removed with surgical scissors.  Dressings applied to each site.   Woundcare discussed with patient.

## 2013-12-18 NOTE — Progress Notes (Signed)
   Subjective:    Patient ID: [name] [name], male    DOB: 06/12/1965, 48 y.o.   MRN: [id number] This chart was scribed for [name] Siaobert Devonia Farro, MD by Jolene Provostobert Halas, Medical Scribe. This patient was seen in Room 10 and the patient's care was started a 5:29 PM.  HPI HPI Comments: [name] BearDavid Shuman is a 48 y.o. male with a hx of liver cirrhosis, HTN, and DM who [facility] wanting moles removed. Pt states he has had the moles for many years. Pt states he has moles on his face, stomach, arms and back that he would like th e Dr. to examine.   Review of Systems  Skin:       Numerous moles.       Objective:   Physical Exam  Nursing note and vitals reviewed. Constitutional: He is oriented to person, place, and time. He appears well-developed and well-nourished.  HENT:  Head: Normocephalic and atraumatic.  Eyes: Pupils are equal, round, and reactive to light.  Neck: No JVD present.  Cardiovascular: Normal rate and regular rhythm.   Pulmonary/Chest: Effort normal and breath sounds normal. No respiratory distress.  Neurological: He is alert and oriented to person, place, and time.  Skin: Skin is warm and dry.  Pt has a multitude of skin abnormalities of all types. On his face there are five significant horny warts, several clear moles, numerous skin tags, one lesion on nose that is rough and may represent a basal cell lesion. Numerous skin tags on neck , back and thigh area.  Psychiatric: He has a normal mood and affect. His behavior is normal.          Assessment & Plan:   I have completed the patient encounter in its entirety as documented by the scribe, with editing by me where necessary. Bluma Buresh P. Merla Richesoolittle, M.D. Skin lesions - Plan: Dermatology pathology not needed  Skin lesion of face - Plan: Dermatology pathology  Refer to derm for other lesions after path

## 2013-12-18 NOTE — Progress Notes (Signed)
I was directly involved in the patient's care and actively participated during the procedure.  

## 2013-12-31 ENCOUNTER — Encounter: Payer: Self-pay | Admitting: Internal Medicine

## 2014-11-04 ENCOUNTER — Emergency Department (HOSPITAL_COMMUNITY): Payer: Medicaid Other

## 2014-11-04 ENCOUNTER — Encounter (HOSPITAL_COMMUNITY): Payer: Self-pay | Admitting: Vascular Surgery

## 2014-11-04 ENCOUNTER — Inpatient Hospital Stay (HOSPITAL_COMMUNITY)
Admission: EM | Admit: 2014-11-04 | Discharge: 2014-11-10 | DRG: 292 | Disposition: A | Discharge status: Home / Self Care | Payer: Medicaid Other | Attending: Internal Medicine | Admitting: Internal Medicine

## 2014-11-04 DIAGNOSIS — R51 Headache: Secondary | ICD-10-CM | POA: Diagnosis present

## 2014-11-04 DIAGNOSIS — R0989 Other specified symptoms and signs involving the circulatory and respiratory systems: Secondary | ICD-10-CM

## 2014-11-04 DIAGNOSIS — K219 Gastro-esophageal reflux disease without esophagitis: Secondary | ICD-10-CM | POA: Diagnosis present

## 2014-11-04 DIAGNOSIS — I5031 Acute diastolic (congestive) heart failure: Secondary | ICD-10-CM

## 2014-11-04 DIAGNOSIS — R1013 Epigastric pain: Secondary | ICD-10-CM

## 2014-11-04 DIAGNOSIS — Z794 Long term (current) use of insulin: Secondary | ICD-10-CM

## 2014-11-04 DIAGNOSIS — Z8719 Personal history of other diseases of the digestive system: Secondary | ICD-10-CM | POA: Insufficient documentation

## 2014-11-04 DIAGNOSIS — E119 Type 2 diabetes mellitus without complications: Secondary | ICD-10-CM

## 2014-11-04 DIAGNOSIS — I5033 Acute on chronic diastolic (congestive) heart failure: Principal | ICD-10-CM | POA: Diagnosis present

## 2014-11-04 DIAGNOSIS — D6959 Other secondary thrombocytopenia: Secondary | ICD-10-CM | POA: Diagnosis present

## 2014-11-04 DIAGNOSIS — Z79899 Other long term (current) drug therapy: Secondary | ICD-10-CM

## 2014-11-04 DIAGNOSIS — M79606 Pain in leg, unspecified: Secondary | ICD-10-CM

## 2014-11-04 DIAGNOSIS — R0902 Hypoxemia: Secondary | ICD-10-CM | POA: Diagnosis present

## 2014-11-04 DIAGNOSIS — R0602 Shortness of breath: Secondary | ICD-10-CM

## 2014-11-04 DIAGNOSIS — F1010 Alcohol abuse, uncomplicated: Secondary | ICD-10-CM

## 2014-11-04 DIAGNOSIS — K449 Diaphragmatic hernia without obstruction or gangrene: Secondary | ICD-10-CM | POA: Diagnosis present

## 2014-11-04 DIAGNOSIS — I8510 Secondary esophageal varices without bleeding: Secondary | ICD-10-CM | POA: Diagnosis present

## 2014-11-04 DIAGNOSIS — F1020 Alcohol dependence, uncomplicated: Secondary | ICD-10-CM | POA: Diagnosis present

## 2014-11-04 DIAGNOSIS — K2920 Alcoholic gastritis without bleeding: Secondary | ICD-10-CM | POA: Diagnosis present

## 2014-11-04 DIAGNOSIS — G4730 Sleep apnea, unspecified: Secondary | ICD-10-CM | POA: Diagnosis present

## 2014-11-04 DIAGNOSIS — E8770 Fluid overload, unspecified: Secondary | ICD-10-CM | POA: Diagnosis present

## 2014-11-04 DIAGNOSIS — G8929 Other chronic pain: Secondary | ICD-10-CM | POA: Diagnosis present

## 2014-11-04 DIAGNOSIS — Z87891 Personal history of nicotine dependence: Secondary | ICD-10-CM

## 2014-11-04 DIAGNOSIS — E876 Hypokalemia: Secondary | ICD-10-CM | POA: Diagnosis present

## 2014-11-04 DIAGNOSIS — K7030 Alcoholic cirrhosis of liver without ascites: Secondary | ICD-10-CM | POA: Diagnosis present

## 2014-11-04 HISTORY — DX: Unspecified cirrhosis of liver: K74.60

## 2014-11-04 HISTORY — DX: Alcohol abuse, uncomplicated: F10.10

## 2014-11-04 LAB — CBC
HCT: 42.2 % (ref 39.0–52.0)
HEMOGLOBIN: 14.3 g/dL (ref 13.0–17.0)
MCH: 32.9 pg (ref 26.0–34.0)
MCHC: 33.9 g/dL (ref 30.0–36.0)
MCV: 97.2 fL (ref 78.0–100.0)
PLATELETS: 62 10*3/uL — AB (ref 150–400)
RBC: 4.34 MIL/uL (ref 4.22–5.81)
RDW: 14.2 % (ref 11.5–15.5)
WBC: 3 10*3/uL — ABNORMAL LOW (ref 4.0–10.5)

## 2014-11-04 LAB — BRAIN NATRIURETIC PEPTIDE: B Natriuretic Peptide: 21.7 pg/mL (ref 0.0–100.0)

## 2014-11-04 LAB — HEPATIC FUNCTION PANEL
ALK PHOS: 67 U/L (ref 38–126)
ALT: 31 U/L (ref 17–63)
AST: 50 U/L — ABNORMAL HIGH (ref 15–41)
Albumin: 2.5 g/dL — ABNORMAL LOW (ref 3.5–5.0)
BILIRUBIN INDIRECT: 1.6 mg/dL — AB (ref 0.3–0.9)
BILIRUBIN TOTAL: 2.3 mg/dL — AB (ref 0.3–1.2)
Bilirubin, Direct: 0.7 mg/dL — ABNORMAL HIGH (ref 0.1–0.5)
TOTAL PROTEIN: 5.2 g/dL — AB (ref 6.5–8.1)

## 2014-11-04 LAB — BASIC METABOLIC PANEL
Anion gap: 8 (ref 5–15)
BUN: <5 mg/dL — ABNORMAL LOW (ref 6–20)
CO2: 23 mmol/L (ref 22–32)
CREATININE: 0.48 mg/dL — AB (ref 0.61–1.24)
Calcium: 7.6 mg/dL — ABNORMAL LOW (ref 8.9–10.3)
Chloride: 106 mmol/L (ref 101–111)
GFR calc Af Amer: >60 mL/min (ref >60)
GFR calc non Af Amer: >60 mL/min (ref >60)
Glucose, Bld: 288 mg/dL — ABNORMAL HIGH (ref 65–99)
Potassium: 3.3 mmol/L — ABNORMAL LOW (ref 3.5–5.1)
Sodium: 137 mmol/L (ref 135–145)

## 2014-11-04 LAB — ETHANOL: ALCOHOL ETHYL (B): 81 mg/dL — AB (ref <5)

## 2014-11-04 LAB — I-STAT TROPONIN, ED: TROPONIN I, POC: 0 ng/mL (ref 0.00–0.08)

## 2014-11-04 LAB — LIPASE, BLOOD: LIPASE: 47 U/L (ref 22–51)

## 2014-11-04 MED ORDER — LORAZEPAM 2 MG/ML IJ SOLN
0.0000 mg | Freq: Four times a day (QID) | INTRAMUSCULAR | Status: DC
  Administered 2014-11-04: 1 mg via INTRAVENOUS
  Filled 2014-11-04: qty 1

## 2014-11-04 MED ORDER — GI COCKTAIL ~~LOC~~
30.0000 mL | Freq: Once | ORAL | Status: AC
  Administered 2014-11-04: 30 mL via ORAL
  Filled 2014-11-04: qty 30

## 2014-11-04 MED ORDER — SODIUM CHLORIDE 0.9 % IV BOLUS (SEPSIS)
1000.0000 mL | Freq: Once | INTRAVENOUS | Status: AC
  Administered 2014-11-04: 1000 mL via INTRAVENOUS

## 2014-11-04 MED ORDER — ONDANSETRON HCL 4 MG/2ML IJ SOLN
4.0000 mg | Freq: Once | INTRAMUSCULAR | Status: AC
  Administered 2014-11-04: 4 mg via INTRAVENOUS
  Filled 2014-11-04: qty 2

## 2014-11-04 MED ORDER — FAMOTIDINE IN NACL 20-0.9 MG/50ML-% IV SOLN
20.0000 mg | Freq: Once | INTRAVENOUS | Status: AC
  Administered 2014-11-04: 20 mg via INTRAVENOUS
  Filled 2014-11-04: qty 50

## 2014-11-04 MED ORDER — LORAZEPAM 2 MG/ML IJ SOLN: 0.0000 mg | Freq: Two times a day (BID) | INTRAMUSCULAR | Status: DC

## 2014-11-04 NOTE — ED Notes (Signed)
Pt reports to the ED for eval of bilateral lower leg swelling, abd pain, and N/V x 7 days. Pt reports he has been drinking heavily x 7 days as well. Pt also reports SOB and dizziness. Localizes the pain in his abd to his epigastric area. Pt also reports seizures when he detox from ETOH. Last drink 15:00. Pt reports some blood in his emesis. Pt A&Ox4, resp e/u, and skin warm and dry.

## 2014-11-04 NOTE — ED Provider Notes (Signed)
CSN: [id number]     Arrival date & time 11/04/14  1752 History   First MD Initiated Contact with Patient 11/04/14 2241     Chief Complaint  Patient presents with  . Leg Swelling     (Consider location/radiation/quality/duration/timing/severity/associated sxs/prior Treatment) HPI Comments: 49 year old male with a history of alcohol abuse and liver cirrhosis with secondary thrombocytopenia, esophageal varices, diabetes mellitus, and sleep apnea presents to the emergency department for evaluation of abdominal pain, nausea, and vomiting. Patient reports abdominal pain, nausea, and vomiting over the past 7 days. Emesis has been intermittently blood, per patient. He reports that his abdominal pain is located in his epigastric region and right upper quadrant. He states that he is a heavy drinker, drinking approximately 12 beers per day. He states that he drank a 12 pack of beer yesterday, but only had one beer this morning. Patient has also noted swelling in his bilateral lower extremities over the past 3-4 days. He complains of a frontal headache, beginning this morning. He has some complaints of some shortness of breath as well as dizziness. No medications taken prior to arrival for symptoms.   LVEF 60% on echo in 2014. No documented history of CHF.  The history is provided by the patient. No language interpreter was used.    Past Medical History  Diagnosis Date  . Shortness of breath   . Sleep apnea   . Diabetes mellitus without complication   . GERD (gastroesophageal reflux disease)   . H/O hiatal hernia   . Seizures   . Headache(784.0)   . No pertinent past medical history   . Cirrhosis   . Alcohol abuse    Past Surgical History  Procedure Laterality Date  . Appendectomy    . No past surgeries    . Radiology with anesthesia  01/30/2012    Procedure: RADIOLOGY WITH ANESTHESIA;  Surgeon: Casimiro Needle T. Miles Costain, MD;  Location: [facility] OR;  Service: Radiology;  Laterality: N/A;   No family  history on file. Social History  Substance Use Topics  . Smoking status: Former Smoker -- 0.25 packs/day for 10 years    Types: Cigarettes    Quit date: 09/30/2007  . Smokeless tobacco: Never Used  . Alcohol Use: Yes     Comment: heavily x 7 days    Review of Systems  Constitutional: Negative for fever.  Respiratory: Positive for shortness of breath.   Cardiovascular: Positive for leg swelling.  Gastrointestinal: Positive for nausea, vomiting and abdominal pain.  Neurological: Positive for headaches. Negative for seizures.  All other systems reviewed and are negative.   Allergies  Review of patient's allergies indicates no known allergies.  Home Medications   Prior to Admission medications   Medication Sig Start Date End Date Taking? Authorizing Provider  Dapagliflozin Propanediol (FARXIGA) 10 MG TABS Take 10 mg by mouth daily. 07/24/13   Quentin Angst, MD  folic acid (FOLVITE) 1 MG tablet Take 1 tablet (1 mg total) by mouth daily. 07/24/13   Quentin Angst, MD  furosemide (LASIX) 40 MG tablet Take 1 tablet (40 mg total) by mouth daily. 07/24/13   Quentin Angst, MD  lactulose (CHRONULAC) 10 GM/15ML solution Take 45 mLs (30 g total) by mouth 2 (two) times daily. 07/24/13   Quentin Angst, MD  propranolol (INDERAL) 10 MG tablet Take 1 tablet (10 mg total) by mouth 3 (three) times daily. 07/24/13   Quentin Angst, MD  spironolactone (ALDACTONE) 25 MG tablet Take 1 tablet (25  mg total) by mouth daily. 07/24/13   Quentin Angst, MD  thiamine 100 MG tablet Take 1 tablet (100 mg total) by mouth daily. 07/24/13   Olugbemiga E Hyman Hopes, MD   BP 142/81 mmHg  Pulse 77  Temp(Src) 98.7 F (37.1 C) (Oral)  Resp 21  SpO2 93%   Physical Exam  Constitutional: He is oriented to person, place, and time. He appears well-developed and well-nourished. No distress.  Nontoxic appearing  HENT:  Head: Normocephalic and atraumatic.  Eyes: Conjunctivae and EOM are normal.   Neck: Normal range of motion.  Cardiovascular: Normal rate, regular rhythm and intact distal pulses.   Pulmonary/Chest: Effort normal. No respiratory distress. He has no wheezes. He has no rales.  SpO2 93% on 2L [place]; no hx of O2 requirement. Lung sounds mildly decreased in b/l bases. No wheezes or rales.  Abdominal: Soft. There is tenderness. There is no rebound and no guarding.  Focal epigastric and RUQ TTP. No masses or peritoneal signs. Negative Murphy's sign. Abdomen is soft; no signs of significant ascites.   Musculoskeletal: Normal range of motion. He exhibits edema.  2+ pitting edema in b/l lower extremities.  Neurological: He is alert and oriented to person, place, and time. He exhibits normal muscle tone. Coordination normal.  GCS 15. Patient answers questions appropriately and follows commands. Patient moves extremities without ataxia.  Skin: Skin is warm and dry. No rash noted. He is not diaphoretic. No erythema. No pallor.  Psychiatric: He has a normal mood and affect. His behavior is normal.  Nursing note and vitals reviewed.   ED Course  Procedures (including critical care time) Labs Review Labs Reviewed  BASIC METABOLIC PANEL - Abnormal; Notable for the following:    Potassium 3.3 (*)    Glucose, Bld 288 (*)    BUN <5 (*)    Creatinine, Ser 0.48 (*)    Calcium 7.6 (*)    All other components within normal limits  CBC - Abnormal; Notable for the following:    WBC 3.0 (*)    Platelets 62 (*)    All other components within normal limits  HEPATIC FUNCTION PANEL - Abnormal; Notable for the following:    Total Protein 5.2 (*)    Albumin 2.5 (*)    AST 50 (*)    Total Bilirubin 2.3 (*)    Bilirubin, Direct 0.7 (*)    Indirect Bilirubin 1.6 (*)    All other components within normal limits  ETHANOL - Abnormal; Notable for the following:    Alcohol, Ethyl (B) 81 (*)    All other components within normal limits  BRAIN NATRIURETIC PEPTIDE  LIPASE, BLOOD  I-STAT  TROPOININ, ED    Imaging Review Dg Chest 2 View  11/04/2014   CLINICAL DATA:  Acute onset of nausea, vomiting, generalized abdominal pain and bilateral lower leg swelling. Shortness of breath and dizziness. Initial encounter.  EXAM: CHEST  2 VIEW  COMPARISON:  Chest radiograph performed 09/12/2014  FINDINGS: The lungs are well-aerated. Vascular congestion is noted, with increased interstitial markings, concerning for mild interstitial edema. There is no evidence of pleural effusion or pneumothorax.  The heart is normal in size; the mediastinal contour is within normal limits. No acute osseous abnormalities are seen.  IMPRESSION: Vascular congestion, with increased interstitial markings, concerning for mild interstitial edema.   Electronically Signed   By: Roanna Raider M.D.   On: 11/04/2014 19:41   I have personally reviewed and evaluated these images and lab results  as part of my medical decision-making.   EKG Interpretation   Date/Time:  Monday November 04 2014 18:33:43 EDT Ventricular Rate:  93 PR Interval:  174 QRS Duration: 106 QT Interval:  370 QTC Calculation: 460 R Axis:   -6 Text Interpretation:  Normal sinus rhythm Possible Inferior infarct , age  undetermined Abnormal ECG No significant change since last tracing  Confirmed by YAO  MD, Tannen ([id number]) on 11/05/2014 1:12:13 AM      MDM   Final diagnoses:  Shortness of breath  Hypoxia  Pulmonary vascular congestion  History of cirrhosis of liver  Alcohol abuse    49 y/o male with known hx of alcohol cirrhosis presents to the ED for multiple complaints. He c/o abdominal pain, nausea, and vomiting which is highly suspect for alcoholic gastritis. Doubt acute pancreatitis given normal lipase. Negative Murphy's sign today; also doubt cholecystitis. Symptoms treated with Pepcid, GI cocktail, zofran and IVF.  Patient also c/o shortness of breath. He has hypoxia of 88% on RA on arrival which persists. Patient requiring 2L O2 via [place]  for O2 sats of 93%. He clinically appears fluid overloaded with pitting edema and signs of vascular congestion on CXR. Suspect this is secondary to cirrhosis rather than CHF picture. BNP today is normal. Lasix given IV.  Given patient's symptoms of SOB with new hypoxia, case discussed with [facility] for admission. Dr. Allena Katz to evaluate the patient for admission. Temporary orders placed for inpatient telemetry bed.   Filed Vitals:   11/05/14 0115 11/05/14 0116 11/05/14 0130 11/05/14 0145  BP: 146/82  134/79 142/81  Pulse: 89 94 85 77  Temp:      TempSrc:      Resp:      SpO2:  91% 91% 93%     Antony Madura, PA-C 11/05/14 0226  Richardean Canal, MD 11/05/14 1130

## 2014-11-05 DIAGNOSIS — G8929 Other chronic pain: Secondary | ICD-10-CM | POA: Diagnosis present

## 2014-11-05 DIAGNOSIS — G4730 Sleep apnea, unspecified: Secondary | ICD-10-CM | POA: Diagnosis present

## 2014-11-05 DIAGNOSIS — E8770 Fluid overload, unspecified: Secondary | ICD-10-CM

## 2014-11-05 DIAGNOSIS — E118 Type 2 diabetes mellitus with unspecified complications: Secondary | ICD-10-CM

## 2014-11-05 DIAGNOSIS — R0602 Shortness of breath: Secondary | ICD-10-CM | POA: Diagnosis not present

## 2014-11-05 DIAGNOSIS — Z79899 Other long term (current) drug therapy: Secondary | ICD-10-CM | POA: Diagnosis not present

## 2014-11-05 DIAGNOSIS — K2920 Alcoholic gastritis without bleeding: Secondary | ICD-10-CM | POA: Diagnosis present

## 2014-11-05 DIAGNOSIS — D6959 Other secondary thrombocytopenia: Secondary | ICD-10-CM | POA: Diagnosis present

## 2014-11-05 DIAGNOSIS — R1013 Epigastric pain: Secondary | ICD-10-CM

## 2014-11-05 DIAGNOSIS — I5033 Acute on chronic diastolic (congestive) heart failure: Secondary | ICD-10-CM | POA: Diagnosis present

## 2014-11-05 DIAGNOSIS — K449 Diaphragmatic hernia without obstruction or gangrene: Secondary | ICD-10-CM | POA: Diagnosis present

## 2014-11-05 DIAGNOSIS — K7030 Alcoholic cirrhosis of liver without ascites: Secondary | ICD-10-CM | POA: Diagnosis present

## 2014-11-05 DIAGNOSIS — K219 Gastro-esophageal reflux disease without esophagitis: Secondary | ICD-10-CM | POA: Diagnosis present

## 2014-11-05 DIAGNOSIS — R0902 Hypoxemia: Secondary | ICD-10-CM | POA: Diagnosis present

## 2014-11-05 DIAGNOSIS — R0989 Other specified symptoms and signs involving the circulatory and respiratory systems: Secondary | ICD-10-CM | POA: Insufficient documentation

## 2014-11-05 DIAGNOSIS — F1020 Alcohol dependence, uncomplicated: Secondary | ICD-10-CM | POA: Diagnosis present

## 2014-11-05 DIAGNOSIS — R51 Headache: Secondary | ICD-10-CM | POA: Diagnosis present

## 2014-11-05 DIAGNOSIS — Z87891 Personal history of nicotine dependence: Secondary | ICD-10-CM | POA: Diagnosis not present

## 2014-11-05 DIAGNOSIS — Z794 Long term (current) use of insulin: Secondary | ICD-10-CM | POA: Diagnosis not present

## 2014-11-05 DIAGNOSIS — E119 Type 2 diabetes mellitus without complications: Secondary | ICD-10-CM | POA: Diagnosis present

## 2014-11-05 DIAGNOSIS — I8510 Secondary esophageal varices without bleeding: Secondary | ICD-10-CM | POA: Diagnosis present

## 2014-11-05 DIAGNOSIS — E876 Hypokalemia: Secondary | ICD-10-CM | POA: Diagnosis present

## 2014-11-05 LAB — CBC
HEMATOCRIT: 42.7 % (ref 39.0–52.0)
HEMOGLOBIN: 14.8 g/dL (ref 13.0–17.0)
MCH: 34 pg (ref 26.0–34.0)
MCHC: 34.7 g/dL (ref 30.0–36.0)
MCV: 98.2 fL (ref 78.0–100.0)
Platelets: 63 10*3/uL — ABNORMAL LOW (ref 150–400)
RBC: 4.35 MIL/uL (ref 4.22–5.81)
RDW: 14.1 % (ref 11.5–15.5)
WBC: 3 10*3/uL — AB (ref 4.0–10.5)

## 2014-11-05 LAB — PROTIME-INR
INR: 1.48 (ref 0.00–1.49)
PROTHROMBIN TIME: 18 s — AB (ref 11.6–15.2)

## 2014-11-05 LAB — COMPREHENSIVE METABOLIC PANEL
ALBUMIN: 2.5 g/dL — AB (ref 3.5–5.0)
ALT: 29 U/L (ref 17–63)
ANION GAP: 5 (ref 5–15)
AST: 47 U/L — ABNORMAL HIGH (ref 15–41)
Alkaline Phosphatase: 69 U/L (ref 38–126)
BILIRUBIN TOTAL: 2.8 mg/dL — AB (ref 0.3–1.2)
BUN: <5 mg/dL — ABNORMAL LOW (ref 6–20)
CALCIUM: 7.6 mg/dL — AB (ref 8.9–10.3)
CO2: 27 mmol/L (ref 22–32)
Chloride: 103 mmol/L (ref 101–111)
Creatinine, Ser: 0.52 mg/dL — ABNORMAL LOW (ref 0.61–1.24)
GFR calc non Af Amer: >60 mL/min (ref >60)
GLUCOSE: 148 mg/dL — AB (ref 65–99)
POTASSIUM: 3.3 mmol/L — AB (ref 3.5–5.1)
SODIUM: 135 mmol/L (ref 135–145)
TOTAL PROTEIN: 5.5 g/dL — AB (ref 6.5–8.1)

## 2014-11-05 LAB — GLUCOSE, CAPILLARY
GLUCOSE-CAPILLARY: 159 mg/dL — AB (ref 65–99)
GLUCOSE-CAPILLARY: 158 mg/dL — AB (ref 65–99)
Glucose-Capillary: 203 mg/dL — ABNORMAL HIGH (ref 65–99)
Glucose-Capillary: 201 mg/dL — ABNORMAL HIGH (ref 65–99)

## 2014-11-05 MED ORDER — PROPRANOLOL HCL 10 MG PO TABS
10.0000 mg | ORAL_TABLET | Freq: Three times a day (TID) | ORAL | Status: DC
  Administered 2014-11-05 – 2014-11-06 (×5): 10 mg via ORAL
  Filled 2014-11-05 (×8): qty 1

## 2014-11-05 MED ORDER — VITAMIN B-1 100 MG PO TABS
100.0000 mg | ORAL_TABLET | Freq: Every day | ORAL | Status: DC
  Administered 2014-11-05 – 2014-11-10 (×6): 100 mg via ORAL
  Filled 2014-11-05 (×6): qty 1

## 2014-11-05 MED ORDER — PANTOPRAZOLE SODIUM 40 MG IV SOLR
40.0000 mg | Freq: Two times a day (BID) | INTRAVENOUS | Status: DC
  Administered 2014-11-05 – 2014-11-10 (×11): 40 mg via INTRAVENOUS
  Filled 2014-11-05 (×12): qty 40

## 2014-11-05 MED ORDER — INSULIN ASPART 100 UNIT/ML ~~LOC~~ SOLN
0.0000 [IU] | Freq: Every day | SUBCUTANEOUS | Status: DC
  Administered 2014-11-05 – 2014-11-08 (×3): 2 [IU] via SUBCUTANEOUS
  Administered 2014-11-09: 3 [IU] via SUBCUTANEOUS

## 2014-11-05 MED ORDER — FUROSEMIDE 10 MG/ML IJ SOLN
40.0000 mg | Freq: Once | INTRAMUSCULAR | Status: AC
  Administered 2014-11-05: 40 mg via INTRAVENOUS
  Filled 2014-11-05: qty 4

## 2014-11-05 MED ORDER — SPIRONOLACTONE 25 MG PO TABS
25.0000 mg | ORAL_TABLET | Freq: Every day | ORAL | Status: DC
  Administered 2014-11-05 – 2014-11-10 (×6): 25 mg via ORAL
  Filled 2014-11-05 (×6): qty 1

## 2014-11-05 MED ORDER — LORAZEPAM 2 MG/ML IJ SOLN: 1.0000 mg | Freq: Four times a day (QID) | INTRAMUSCULAR | Status: AC | PRN

## 2014-11-05 MED ORDER — CETYLPYRIDINIUM CHLORIDE 0.05 % MT LIQD
7.0000 mL | Freq: Two times a day (BID) | OROMUCOSAL | Status: DC
  Administered 2014-11-05 – 2014-11-10 (×11): 7 mL via OROMUCOSAL

## 2014-11-05 MED ORDER — POTASSIUM CHLORIDE CRYS ER 20 MEQ PO TBCR
40.0000 meq | EXTENDED_RELEASE_TABLET | Freq: Once | ORAL | Status: AC
  Administered 2014-11-05: 40 meq via ORAL
  Filled 2014-11-05: qty 2

## 2014-11-05 MED ORDER — ONDANSETRON HCL 4 MG/2ML IJ SOLN
4.0000 mg | Freq: Four times a day (QID) | INTRAMUSCULAR | Status: DC | PRN
  Administered 2014-11-05 – 2014-11-06 (×2): 4 mg via INTRAVENOUS
  Filled 2014-11-05 (×3): qty 2

## 2014-11-05 MED ORDER — CHLORDIAZEPOXIDE HCL 5 MG PO CAPS
10.0000 mg | ORAL_CAPSULE | Freq: Three times a day (TID) | ORAL | Status: DC
  Administered 2014-11-05 – 2014-11-06 (×5): 10 mg via ORAL
  Filled 2014-11-05 (×5): qty 2

## 2014-11-05 MED ORDER — ADULT MULTIVITAMIN W/MINERALS CH
1.0000 | ORAL_TABLET | Freq: Every day | ORAL | Status: DC
  Administered 2014-11-05 – 2014-11-10 (×6): 1 via ORAL
  Filled 2014-11-05 (×6): qty 1

## 2014-11-05 MED ORDER — LORAZEPAM 1 MG PO TABS
1.0000 mg | ORAL_TABLET | Freq: Four times a day (QID) | ORAL | Status: AC | PRN
  Administered 2014-11-05: 1 mg via ORAL
  Filled 2014-11-05: qty 1

## 2014-11-05 MED ORDER — POTASSIUM CHLORIDE CRYS ER 20 MEQ PO TBCR
40.0000 meq | EXTENDED_RELEASE_TABLET | ORAL | Status: AC
  Administered 2014-11-05 (×2): 40 meq via ORAL
  Filled 2014-11-05 (×2): qty 2

## 2014-11-05 MED ORDER — TRAMADOL HCL 50 MG PO TABS
50.0000 mg | ORAL_TABLET | Freq: Four times a day (QID) | ORAL | Status: AC | PRN
  Administered 2014-11-06 – 2014-11-07 (×2): 50 mg via ORAL
  Filled 2014-11-05 (×3): qty 1

## 2014-11-05 MED ORDER — FOLIC ACID 1 MG PO TABS
1.0000 mg | ORAL_TABLET | Freq: Every day | ORAL | Status: DC
  Administered 2014-11-05 – 2014-11-10 (×6): 1 mg via ORAL
  Filled 2014-11-05 (×6): qty 1

## 2014-11-05 MED ORDER — FUROSEMIDE 40 MG PO TABS
40.0000 mg | ORAL_TABLET | Freq: Every day | ORAL | Status: DC
  Administered 2014-11-05: 40 mg via ORAL
  Filled 2014-11-05: qty 1

## 2014-11-05 MED ORDER — ONDANSETRON HCL 4 MG PO TABS: 4.0000 mg | ORAL_TABLET | Freq: Four times a day (QID) | ORAL | Status: DC | PRN

## 2014-11-05 MED ORDER — INSULIN ASPART 100 UNIT/ML ~~LOC~~ SOLN
0.0000 [IU] | Freq: Three times a day (TID) | SUBCUTANEOUS | Status: DC
  Administered 2014-11-05 (×2): 3 [IU] via SUBCUTANEOUS
  Administered 2014-11-05: 5 [IU] via SUBCUTANEOUS
  Administered 2014-11-06: 3 [IU] via SUBCUTANEOUS
  Administered 2014-11-06: 5 [IU] via SUBCUTANEOUS
  Administered 2014-11-06: 2 [IU] via SUBCUTANEOUS
  Administered 2014-11-07: 8 [IU] via SUBCUTANEOUS
  Administered 2014-11-07 – 2014-11-08 (×3): 3 [IU] via SUBCUTANEOUS
  Administered 2014-11-08: 8 [IU] via SUBCUTANEOUS
  Administered 2014-11-08: 2 [IU] via SUBCUTANEOUS
  Administered 2014-11-09: 5 [IU] via SUBCUTANEOUS
  Administered 2014-11-09: 2 [IU] via SUBCUTANEOUS
  Administered 2014-11-09: 3 [IU] via SUBCUTANEOUS
  Administered 2014-11-10: 11 [IU] via SUBCUTANEOUS

## 2014-11-05 MED ORDER — FUROSEMIDE 10 MG/ML IJ SOLN
40.0000 mg | Freq: Two times a day (BID) | INTRAMUSCULAR | Status: DC
  Administered 2014-11-05 – 2014-11-08 (×7): 40 mg via INTRAVENOUS
  Filled 2014-11-05 (×6): qty 4

## 2014-11-05 MED ORDER — SODIUM CHLORIDE 0.9 % IJ SOLN
3.0000 mL | Freq: Two times a day (BID) | INTRAMUSCULAR | Status: DC
  Administered 2014-11-05 – 2014-11-10 (×11): 3 mL via INTRAVENOUS

## 2014-11-05 MED ORDER — LACTULOSE 10 GM/15ML PO SOLN
30.0000 g | Freq: Two times a day (BID) | ORAL | Status: DC
  Administered 2014-11-05 – 2014-11-10 (×11): 30 g via ORAL
  Filled 2014-11-05 (×11): qty 45

## 2014-11-05 NOTE — ED Notes (Signed)
Pulse ox 92-93% while ambulating on RA.

## 2014-11-05 NOTE — Progress Notes (Signed)
Patient Demographics:    [name] [name], is a 49 y.o. male, DOB - [birth year], ZOX:[id number]  Admit date - 11/04/2014   Admitting Physician Rolly Salter, MD  Outpatient Primary MD for the patient is Jeanann Lewandowsky, MD  LOS - 0   Chief Complaint  Patient presents with  . Leg Swelling        Subjective:    [name] [name] today has, No headache, No chest pain, No Nausea, No new weakness tingling or numbness, No Cough - improved shortness of breath and epigastric pain.   Assessment  & Plan :     1. Acute on chronic diastolic CHF with EF 60% on last echogram. Much improved with IV Lasix which will be continued long with Aldactone, salt and fluid restriction, continue Inderal. Monitor BMP.   2. Alcoholic binge for the last 7 days in a patient with alcoholic cirrhosis.  Counseled to quit alcohol. Placed on Librium along with CIWA protocol. Continue lactulose. Follow with GI outpatient post discharge.   3. Thrombocytopenia due to cirrhosis. Supportive care. No signs of bleed.   4. Epigastric abdominal pain.  Has mild epigastric pain, lipase negative, likely has mild gastritis later to recent alcoholic binge, no melena or blood in stool. H&H and BUN stable. Placed on IV PPI.    5. Hypokalemia. Replaced and monitor.   6. DM type II. Continue sliding scale. Diet control at home. A1c is pending.  CBG (last 3)   Recent Labs  11/05/14 0558  GLUCAP 159*      Code Status : Full  Family Communication  : None present  Disposition Plan  : Home 1-2 days  Consults  :     Procedures  :    DVT Prophylaxis  :    SCDs    Lab Results  Component Value Date   PLT 63* 11/05/2014    Inpatient Medications  Scheduled Meds: . antiseptic oral rinse  7 mL Mouth Rinse BID  . chlordiazePOXIDE   10 mg Oral TID  . folic acid  1 mg Oral Daily  . furosemide  40 mg Intravenous Q12H  . insulin aspart  0-15 Units Subcutaneous TID WC  . insulin aspart  0-5 Units Subcutaneous QHS  . lactulose  30 g Oral BID  . multivitamin with minerals  1 tablet Oral Daily  . pantoprazole (PROTONIX) IV  40 mg Intravenous Q12H  . propranolol  10 mg Oral TID  . sodium chloride  3 mL Intravenous Q12H  . spironolactone  25 mg Oral Daily  . thiamine  100 mg Oral Daily   Continuous Infusions:  PRN Meds:.LORazepam **OR** LORazepam, [DISCONTINUED] ondansetron **OR** ondansetron (ZOFRAN) IV  Antibiotics  :     Anti-infectives    None        Objective:   Filed Vitals:   11/05/14 0145 11/05/14 0200 11/05/14 0306 11/05/14 0800  BP: 142/81 139/76 156/86 144/76  Pulse: 77 90 88 94  Temp:   98.9 F (37.2 C) 98.5 F (36.9 C)  TempSrc:   Oral Oral  Resp:   17   Height:   5\' 9"  (1.753 m)   Weight:   105.779 kg (233 lb 3.2 oz)   SpO2: 93%  98% 96%  Wt Readings from Last 3 Encounters:  11/05/14 105.779 kg (233 lb 3.2 oz)  12/18/13 110.678 kg (244 lb)  07/24/13 105.688 kg (233 lb)     Intake/Output Summary (Last 24 hours) at 11/05/14 1100 Last data filed at 11/05/14 1054  Gross per 24 hour  Intake    820 ml  Output   1750 ml  Net   -930 ml     Physical Exam  Awake Alert, Oriented X 3, No new F.N deficits, Normal affect [place].AT,PERRAL Supple Neck,No JVD, No cervical lymphadenopathy appriciated.  Symmetrical Chest wall movement, Good air movement bilaterally, +ve rales RRR,No Gallops,Rubs or new Murmurs, No Parasternal Heave +ve B.Sounds, Abd Soft, mild epigastric tenderness, No organomegaly appriciated, No rebound - guarding or rigidity. No Cyanosis, Clubbing , trace edema, No new Rash or bruise       Data Review:   Micro Results No results found for this or any previous visit (from the past 240 hour(s)).  Radiology Reports Dg Chest 2 View  11/04/2014   CLINICAL DATA:  Acute  onset of nausea, vomiting, generalized abdominal pain and bilateral lower leg swelling. Shortness of breath and dizziness. Initial encounter.  EXAM: CHEST  2 VIEW  COMPARISON:  Chest radiograph performed 09/12/2014  FINDINGS: The lungs are well-aerated. Vascular congestion is noted, with increased interstitial markings, concerning for mild interstitial edema. There is no evidence of pleural effusion or pneumothorax.  The heart is normal in size; the mediastinal contour is within normal limits. No acute osseous abnormalities are seen.  IMPRESSION: Vascular congestion, with increased interstitial markings, concerning for mild interstitial edema.   Electronically Signed   By: Roanna Raider M.D.   On: 11/04/2014 19:41     CBC  Recent Labs Lab 11/04/14 1837 11/05/14 0545  WBC 3.0* 3.0*  HGB 14.3 14.8  HCT 42.2 42.7  PLT 62* 63*  MCV 97.2 98.2  MCH 32.9 34.0  MCHC 33.9 34.7  RDW 14.2 14.1    Chemistries   Recent Labs Lab 11/04/14 1837 11/05/14 0545  NA 137 135  K 3.3* 3.3*  CL 106 103  CO2 23 27  GLUCOSE 288* 148*  BUN <5* <5*  CREATININE 0.48* 0.52*  CALCIUM 7.6* 7.6*  AST 50* 47*  ALT 31 29  ALKPHOS 67 69  BILITOT 2.3* 2.8*   ------------------------------------------------------------------------------------------------------------------ estimated creatinine clearance is 132.3 mL/min (by C-G formula based on Cr of 0.52). ------------------------------------------------------------------------------------------------------------------ No results for input(s): HGBA1C in the last 72 hours. ------------------------------------------------------------------------------------------------------------------ No results for input(s): CHOL, HDL, LDLCALC, TRIG, CHOLHDL, LDLDIRECT in the last 72 hours. ------------------------------------------------------------------------------------------------------------------ No results for input(s): TSH, T4TOTAL, T3FREE, THYROIDAB in the last 72  hours.  Invalid input(s): FREET3 ------------------------------------------------------------------------------------------------------------------ No results for input(s): VITAMINB12, FOLATE, FERRITIN, TIBC, IRON, RETICCTPCT in the last 72 hours.  Coagulation profile  Recent Labs Lab 11/05/14 0545  INR 1.48    No results for input(s): DDIMER in the last 72 hours.  Cardiac Enzymes No results for input(s): CKMB, TROPONINI, MYOGLOBIN in the last 168 hours.  Invalid input(s): CK ------------------------------------------------------------------------------------------------------------------ Invalid input(s): POCBNP   Time Spent in minutes   35   SINGH,PRASHANT K M.D on 11/05/2014 at 11:00 AM  Between 7am to 7pm - Pager - [phone]  After 7pm go to [email or web address] - password [facility]  [facility] -  Office  [phone]

## 2014-11-05 NOTE — Progress Notes (Signed)
Pt arrived to floor in NAD, still complaining of intermittent abdominal pain, VSS, Pt placed on telemetry. Pt oriented to room and call bell. Pt states he can understand some English but Spanish is his first language. Will continue to monitor. Huel Coventry, RN

## 2014-11-05 NOTE — H&P (Signed)
[facility] History and Physical  Patient: [name] [name]  MRN: [id number]  DOB: [birth year]  DOS: the patient was seen and examined on 11/05/2014 PCP: Jeanann Lewandowsky, MD  Referring physician:  Dr. Silverio Lay Chief Complaint:  Shortness of breath and epigastric pain  HPI: [name] [name] is a 49 y.o. male with Past medical history of  Alcohol liver cirrhosis, varices, GERD, diabetes mellitus, sleep apnea.  the patient is presenting with complaints of abdominal pain located in the upper abdomen region feels like a burning pain associated with nausea. He also started having complaints of shortness of breath at the same time over last few days. Patient mentions he drinks 12 cans of beer on a daily basis and sometimes even more than that. His last drink was earlier on the morning of 11/04/2014.  He has history of alcoholic cirrhosis but the patient does not take any medications. Over last 1 week he has noted his legs has started swelling and over last few days he started having shortness of breath associated with orthopnea. He denies having any chest pain or chest tightness. No fever no chills no cough. No  Diarrhea nausea vomiting or active bleeding at the time of my evaluation.  his abdominal pain started just today.  The patient is coming from home.  At his baseline ambulates  Without any support And is independent for most of his ADL does manages his medication on his own.  Review of Systems: as mentioned in the history of present illness.  A comprehensive review of the other systems is negative.  Past Medical History  Diagnosis Date  . Shortness of breath   . Sleep apnea   . Diabetes mellitus without complication   . GERD (gastroesophageal reflux disease)   . H/O hiatal hernia   . Seizures   . Headache(784.0)   . No pertinent past medical history   . Cirrhosis   . Alcohol abuse    Past Surgical History  Procedure Laterality Date  . Appendectomy    . No past surgeries      . Radiology with anesthesia  01/30/2012    Procedure: RADIOLOGY WITH ANESTHESIA;  Surgeon: Casimiro Needle T. Miles Costain, MD;  Location: [facility] OR;  Service: Radiology;  Laterality: N/A;   Social History:  reports that he quit smoking about 7 years ago. His smoking use included Cigarettes. He has a 2.5 pack-year smoking history. He has never used smokeless tobacco. He reports that he drinks alcohol. He reports that he does not use illicit drugs.  No Known Allergies  No family history on file.  Prior to Admission medications   Medication Sig Start Date End Date Taking? Authorizing Provider  Dapagliflozin Propanediol (FARXIGA) 10 MG TABS Take 10 mg by mouth daily. 07/24/13   Quentin Angst, MD  folic acid (FOLVITE) 1 MG tablet Take 1 tablet (1 mg total) by mouth daily. 07/24/13   Quentin Angst, MD  furosemide (LASIX) 40 MG tablet Take 1 tablet (40 mg total) by mouth daily. 07/24/13   Quentin Angst, MD  lactulose (CHRONULAC) 10 GM/15ML solution Take 45 mLs (30 g total) by mouth 2 (two) times daily. 07/24/13   Quentin Angst, MD  propranolol (INDERAL) 10 MG tablet Take 1 tablet (10 mg total) by mouth 3 (three) times daily. 07/24/13   Quentin Angst, MD  spironolactone (ALDACTONE) 25 MG tablet Take 1 tablet (25 mg total) by mouth daily. 07/24/13   Quentin Angst, MD  thiamine 100 MG tablet Take 1  tablet (100 mg total) by mouth daily. 07/24/13   Quentin Angst, MD    Physical Exam: Filed Vitals:   11/05/14 0130 11/05/14 0145 11/05/14 0200 11/05/14 0306  BP: 134/79 142/81 139/76 156/86  Pulse: 85 77 90 88  Temp:    98.9 F (37.2 C)  TempSrc:    Oral  Resp:    17  Height:    5\' 9"  (1.753 m)  Weight:    105.779 kg (233 lb 3.2 oz)  SpO2: 91% 93%  98%    General: Alert, Awake and Oriented to Time, Place and Person. Appear in mild distress Eyes: PERRL ENT: Oral Mucosa clear moist. Neck: no JVD Cardiovascular: S1 and S2 Present, no Murmur, Peripheral Pulses  Present Respiratory: Bilateral Air entry equal and Decreased,   basal Crackles, no wheezes Abdomen: Bowel Sound present, Soft and  Mild epigastric tenderness Skin: no Rash Extremities:  bilateral Pedal edema, no calf tenderness Neurologic: Grossly no focal neuro deficit.  Labs on Admission:  CBC:  Recent Labs Lab 11/04/14 1837  WBC 3.0*  HGB 14.3  HCT 42.2  MCV 97.2  PLT 62*    CMP     Component Value Date/Time   NA 137 11/04/2014 1837   K 3.3* 11/04/2014 1837   CL 106 11/04/2014 1837   CO2 23 11/04/2014 1837   GLUCOSE 288* 11/04/2014 1837   BUN <5* 11/04/2014 1837   CREATININE 0.48* 11/04/2014 1837   CREATININE 0.53 07/24/2013 1101   CALCIUM 7.6* 11/04/2014 1837   PROT 5.2* 11/04/2014 1837   ALBUMIN 2.5* 11/04/2014 1837   AST 50* 11/04/2014 1837   ALT 31 11/04/2014 1837   ALKPHOS 67 11/04/2014 1837   BILITOT 2.3* 11/04/2014 1837   GFRNONAA >60 11/04/2014 1837   GFRNONAA >89 07/24/2013 1101   GFRAA >60 11/04/2014 1837   GFRAA >89 07/24/2013 1101     Recent Labs Lab 11/04/14 1837  LIPASE 47    No results for input(s): CKTOTAL, CKMB, CKMBINDEX, TROPONINI in the last 168 hours. BNP (last 3 results)  Recent Labs  11/04/14 1837  BNP 21.7    ProBNP (last 3 results) No results for input(s): PROBNP in the last 8760 hours.   Radiological Exams on Admission: Dg Chest 2 View  11/04/2014   CLINICAL DATA:  Acute onset of nausea, vomiting, generalized abdominal pain and bilateral lower leg swelling. Shortness of breath and dizziness. Initial encounter.  EXAM: CHEST  2 VIEW  COMPARISON:  Chest radiograph performed 09/12/2014  FINDINGS: The lungs are well-aerated. Vascular congestion is noted, with increased interstitial markings, concerning for mild interstitial edema. There is no evidence of pleural effusion or pneumothorax.  The heart is normal in size; the mediastinal contour is within normal limits. No acute osseous abnormalities are seen.  IMPRESSION: Vascular  congestion, with increased interstitial markings, concerning for mild interstitial edema.   Electronically Signed   By: Roanna Raider M.D.   On: 11/04/2014 19:41   EKG: Independently reviewed. normal sinus rhythm, nonspecific ST and T waves changes.  Assessment/Plan Principal Problem:   Diastolic dysfunction with acute on chronic heart failure Active Problems:   Thrombocytopenia, secondary   DM type 2 (diabetes mellitus, type 2)   Esophageal varices in alcoholic cirrhosis   Liver cirrhosis, alcoholic   Volume overload   Abdominal pain, chronic, epigastric   1. Diastolic dysfunction with acute on chronic heart failure  the patient is presenting with complaints of progressively worsening shortness of breath with leg swelling. Chest x-ray  shows vascular congestion. BNP is not elevated. This most likely represents vascular congestion and volume overload secondary to cirrhosis as well as hypoalbuminemia. The patient has responded to IV Lasix and we will continue with 40 mg of Lasix on a daily basis. Also continue with Aldactone. The patient is reportedly not taking any of his medications at home.  2. Diabetes mellitus type 2. Most likely uncontrolled since the patient is not taking medication. Checking hemoglobin A1c and placing him on insulin sliding scale.  3. Liver cirrhosis. Abdominal pain. Esophageal viruses.  resuming propranolol as well as twice a day PPI.  lipase levels are negative. 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  also resuming lactulose.  4. Alcohol abuse.  placing the patient on alcohol withdrawal prevention protocol with lorazepam as well as thiamine.  Advance goals of care discussion:  Full code   DVT Prophylaxis:mechanical compression device Nutrition:  Cardiac and diabetic diet  Disposition: Admitted as inpatient, telemetry unit.  Author: Lynden Oxford, MD [facility] Pager: [phone] 11/05/2014  If 7PM-7AM, please contact  night-coverage [email or web address] Password [email or web address]

## 2014-11-06 DIAGNOSIS — I5031 Acute diastolic (congestive) heart failure: Secondary | ICD-10-CM

## 2014-11-06 DIAGNOSIS — D6959 Other secondary thrombocytopenia: Secondary | ICD-10-CM

## 2014-11-06 DIAGNOSIS — F1010 Alcohol abuse, uncomplicated: Secondary | ICD-10-CM

## 2014-11-06 DIAGNOSIS — R1013 Epigastric pain: Secondary | ICD-10-CM

## 2014-11-06 DIAGNOSIS — Z8719 Personal history of other diseases of the digestive system: Secondary | ICD-10-CM

## 2014-11-06 DIAGNOSIS — G8929 Other chronic pain: Secondary | ICD-10-CM

## 2014-11-06 LAB — COMPREHENSIVE METABOLIC PANEL
ALK PHOS: 81 U/L (ref 38–126)
ALT: 32 U/L (ref 17–63)
ANION GAP: 9 (ref 5–15)
AST: 48 U/L — ABNORMAL HIGH (ref 15–41)
Albumin: 2.7 g/dL — ABNORMAL LOW (ref 3.5–5.0)
BILIRUBIN TOTAL: 3.6 mg/dL — AB (ref 0.3–1.2)
BUN: <5 mg/dL — ABNORMAL LOW (ref 6–20)
CALCIUM: 8.3 mg/dL — AB (ref 8.9–10.3)
CO2: 25 mmol/L (ref 22–32)
CREATININE: 0.58 mg/dL — AB (ref 0.61–1.24)
Chloride: 104 mmol/L (ref 101–111)
Glucose, Bld: 121 mg/dL — ABNORMAL HIGH (ref 65–99)
Potassium: 3.3 mmol/L — ABNORMAL LOW (ref 3.5–5.1)
SODIUM: 138 mmol/L (ref 135–145)
TOTAL PROTEIN: 6.5 g/dL (ref 6.5–8.1)

## 2014-11-06 LAB — CBC
HCT: 46.2 % (ref 39.0–52.0)
Hemoglobin: 15.9 g/dL (ref 13.0–17.0)
MCH: 34 pg (ref 26.0–34.0)
MCHC: 34.4 g/dL (ref 30.0–36.0)
MCV: 98.9 fL (ref 78.0–100.0)
PLATELETS: 75 10*3/uL — AB (ref 150–400)
RBC: 4.67 MIL/uL (ref 4.22–5.81)
RDW: 14.5 % (ref 11.5–15.5)
WBC: 4.2 10*3/uL (ref 4.0–10.5)

## 2014-11-06 LAB — GLUCOSE, CAPILLARY
GLUCOSE-CAPILLARY: 128 mg/dL — AB (ref 65–99)
GLUCOSE-CAPILLARY: 157 mg/dL — AB (ref 65–99)
Glucose-Capillary: 245 mg/dL — ABNORMAL HIGH (ref 65–99)
Glucose-Capillary: 179 mg/dL — ABNORMAL HIGH (ref 65–99)

## 2014-11-06 LAB — HEMOGLOBIN A1C
Hgb A1c MFr Bld: 7.2 % — ABNORMAL HIGH (ref 4.8–5.6)
Mean Plasma Glucose: 160 mg/dL

## 2014-11-06 LAB — MAGNESIUM: MAGNESIUM: 1.8 mg/dL (ref 1.7–2.4)

## 2014-11-06 MED ORDER — INSULIN ASPART 100 UNIT/ML ~~LOC~~ SOLN
4.0000 [IU] | Freq: Three times a day (TID) | SUBCUTANEOUS | Status: DC
  Administered 2014-11-07 – 2014-11-10 (×10): 4 [IU] via SUBCUTANEOUS

## 2014-11-06 MED ORDER — CARVEDILOL 3.125 MG PO TABS
3.1250 mg | ORAL_TABLET | Freq: Two times a day (BID) | ORAL | Status: DC
  Administered 2014-11-06 – 2014-11-07 (×2): 3.125 mg via ORAL
  Filled 2014-11-06 (×2): qty 1

## 2014-11-06 MED ORDER — POTASSIUM CHLORIDE CRYS ER 20 MEQ PO TBCR
40.0000 meq | EXTENDED_RELEASE_TABLET | Freq: Every day | ORAL | Status: DC
  Administered 2014-11-06 – 2014-11-10 (×5): 40 meq via ORAL
  Filled 2014-11-06 (×5): qty 2

## 2014-11-06 MED ORDER — CHLORDIAZEPOXIDE HCL 5 MG PO CAPS
5.0000 mg | ORAL_CAPSULE | Freq: Three times a day (TID) | ORAL | Status: DC
  Administered 2014-11-06 – 2014-11-07 (×3): 5 mg via ORAL
  Filled 2014-11-06 (×3): qty 1

## 2014-11-06 NOTE — Progress Notes (Signed)
PROGRESS NOTE  [name] [name] [name] DOB: 1966-01-09 DOA: 11/04/2014 PCP: [name] Lewandowsky, MD  Assessment/Plan: Acute diastolic CHF -10/22/2012 echocardiogram EF 60% -Repeat echocardiogram -neg 2 L since admission -daily weight -d/c inderal -start coreg -continue IV lasix  -continue aldactone for now -Remains clinically volume overloaded Alcohol dependence -Patient had been on an alcohol binge for 1 week prior to admission -Alcohol withdrawal protocol -Wean Librium Diabetes mellitus type 2 -Diet controlled at home -Hemoglobin A1c 7.2 -We'll need to start oral agents after discharge -Start NovoLog 4 units with meals -Continue NovoLog sliding scale Hypokalemia -Replete -Check magnesium Epigastric abdominal pain -No melena or hematemesis -Hemoglobin stable -Continue PPI Thrombocytopenia -Secondary to chronic alcohol use and possible underlying cirrhosis -Monitor for signs of bleeding -Check serum B12     Family Communication:   Pt at beside Disposition Plan:   Home when medically stable       Procedures/Studies: Dg Chest 2 View  11/04/2014   CLINICAL DATA:  Acute onset of nausea, vomiting, generalized abdominal pain and bilateral lower leg swelling. Shortness of breath and dizziness. Initial encounter.  EXAM: CHEST  2 VIEW  COMPARISON:  Chest radiograph performed 09/12/2014  FINDINGS: The lungs are well-aerated. Vascular congestion is noted, with increased interstitial markings, concerning for mild interstitial edema. There is no evidence of pleural effusion or pneumothorax.  The heart is normal in size; the mediastinal contour is within normal limits. No acute osseous abnormalities are seen.  IMPRESSION: Vascular congestion, with increased interstitial markings, concerning for mild interstitial edema.   Electronically Signed   By: Roanna Raider M.D.   On: 11/04/2014 19:41         Subjective: Patient is breathing better but still having  some dyspnea on exertion. Denies any fevers, chills, chest pain, vomiting, hematochezia, melena, hematemesis. Denies any abdominal pain. Has loose stools.  Objective: Filed Vitals:   11/06/14 1200 11/06/14 1331 11/06/14 1346 11/06/14 1611  BP: 109/53 130/51 124/69 122/62  Pulse: 65 65 70 75  Temp:   98.1 F (36.7 C) 98.1 F (36.7 C)  TempSrc:   Oral   Resp:   18 18  Height:      Weight:      SpO2:  98% 96% 98%    Intake/Output Summary (Last 24 hours) at 11/06/14 1845 Last data filed at 11/06/14 1320  Gross per 24 hour  Intake   1080 ml  Output   1400 ml  Net   -320 ml   Weight change: -5.398 kg (-11 lb 14.4 oz) Exam:   General:  Pt is alert, follows commands appropriately, not in acute distress  HEENT: No icterus, No thrush, No neck mass, [place]/AT  Cardiovascular: RRR, S1/S2, no rubs, no gallops  Respiratory: Bibasilar crackles. No wheezing.  Abdomen: Soft/+BS, non tender, non distended, no guarding  Extremities: 1+ LE edema, No lymphangitis, No petechiae, No rashes, no synovitis  Data Reviewed: Basic Metabolic Panel:  Recent Labs Lab 11/04/14 1837 11/05/14 0545 11/06/14 0335  NA 137 135 138  K 3.3* 3.3* 3.3*  CL 106 103 104  CO2 23 27 25   GLUCOSE 288* 148* 121*  BUN <5* <5* <5*  CREATININE 0.48* 0.52* 0.58*  CALCIUM 7.6* 7.6* 8.3*  MG  --   --  1.8   Liver Function Tests:  Recent Labs Lab 11/04/14 1837 11/05/14 0545 11/06/14 0335  AST 50* 47* 48*  ALT 31 29 32  ALKPHOS 67 69 81  BILITOT  2.3* 2.8* 3.6*  PROT 5.2* 5.5* 6.5  ALBUMIN 2.5* 2.5* 2.7*    Recent Labs Lab 11/04/14 1837  LIPASE 47   No results for input(s): AMMONIA in the last 168 hours. CBC:  Recent Labs Lab 11/04/14 1837 11/05/14 0545 11/06/14 0335  WBC 3.0* 3.0* 4.2  HGB 14.3 14.8 15.9  HCT 42.2 42.7 46.2  MCV 97.2 98.2 98.9  PLT 62* 63* 75*   Cardiac Enzymes: No results for input(s): CKTOTAL, CKMB, CKMBINDEX, TROPONINI in the last 168 hours. BNP: Invalid input(s):  POCBNP CBG:  Recent Labs Lab 11/05/14 1634 11/05/14 2107 11/06/14 0543 11/06/14 1144 11/06/14 1705  GLUCAP 203* 201* 128* 245* 157*    No results found for this or any previous visit (from the past 240 hour(s)).   Scheduled Meds: . antiseptic oral rinse  7 mL Mouth Rinse BID  . chlordiazePOXIDE  10 mg Oral TID  . folic acid  1 mg Oral Daily  . furosemide  40 mg Intravenous Q12H  . insulin aspart  0-15 Units Subcutaneous TID WC  . insulin aspart  0-5 Units Subcutaneous QHS  . [START ON 11/07/2014] insulin aspart  4 Units Subcutaneous TID WC  . lactulose  30 g Oral BID  . multivitamin with minerals  1 tablet Oral Daily  . pantoprazole (PROTONIX) IV  40 mg Intravenous Q12H  . propranolol  10 mg Oral TID  . sodium chloride  3 mL Intravenous Q12H  . spironolactone  25 mg Oral Daily  . thiamine  100 mg Oral Daily   Continuous Infusions:    [name] [name], [name], DO  [facility] Pager [phone]  If 7PM-7AM, please contact night-coverage [email or web address] Password [email or web address] 11/06/2014, 6:45 PM   LOS: 1 day

## 2014-11-06 NOTE — Progress Notes (Signed)
Results for DAYSHON, ROBACK (MRN [id number]) as of 11/06/2014 12:30  Ref. Range 11/05/2014 11:18 11/05/2014 16:34 11/05/2014 21:07 11/06/2014 05:43 11/06/2014 11:44  Glucose-Capillary Latest Ref Range: 65-99 mg/dL 409 (H) 811 (H) 914 (H) 128 (H) 245 (H)  Postprandial blood sugars continue to be greater than 180 mg/dl. Recommend adding Novolog 4 units TID with meals if eating at least 50% of meals. Will continue to follow while in hospital. Smith Mince RN BSN CDE

## 2014-11-06 NOTE — Progress Notes (Signed)
Utilization Review Completed.[name] [name] T8/31/2016  

## 2014-11-07 ENCOUNTER — Inpatient Hospital Stay (HOSPITAL_COMMUNITY): Payer: Medicaid Other

## 2014-11-07 DIAGNOSIS — I5033 Acute on chronic diastolic (congestive) heart failure: Principal | ICD-10-CM

## 2014-11-07 DIAGNOSIS — I509 Heart failure, unspecified: Secondary | ICD-10-CM

## 2014-11-07 LAB — RPR: RPR Ser Ql: NONREACTIVE

## 2014-11-07 LAB — MAGNESIUM: MAGNESIUM: 1.7 mg/dL (ref 1.7–2.4)

## 2014-11-07 LAB — VITAMIN B12: Vitamin B-12: 1223 pg/mL — ABNORMAL HIGH (ref 180–914)

## 2014-11-07 LAB — BASIC METABOLIC PANEL
Anion gap: 7 (ref 5–15)
BUN: 6 mg/dL (ref 6–20)
CHLORIDE: 104 mmol/L (ref 101–111)
CO2: 27 mmol/L (ref 22–32)
CREATININE: 0.67 mg/dL (ref 0.61–1.24)
Calcium: 8.6 mg/dL — ABNORMAL LOW (ref 8.9–10.3)
GFR calc Af Amer: >60 mL/min (ref >60)
GFR calc non Af Amer: >60 mL/min (ref >60)
GLUCOSE: 129 mg/dL — AB (ref 65–99)
POTASSIUM: 3.5 mmol/L (ref 3.5–5.1)
Sodium: 138 mmol/L (ref 135–145)

## 2014-11-07 LAB — GLUCOSE, CAPILLARY
GLUCOSE-CAPILLARY: 242 mg/dL — AB (ref 65–99)
GLUCOSE-CAPILLARY: 171 mg/dL — AB (ref 65–99)
GLUCOSE-CAPILLARY: 249 mg/dL — AB (ref 65–99)
Glucose-Capillary: 169 mg/dL — ABNORMAL HIGH (ref 65–99)
Glucose-Capillary: 185 mg/dL — ABNORMAL HIGH (ref 65–99)
Glucose-Capillary: 263 mg/dL — ABNORMAL HIGH (ref 65–99)

## 2014-11-07 LAB — HIV ANTIBODY (ROUTINE TESTING W REFLEX): HIV Screen 4th Generation wRfx: NONREACTIVE

## 2014-11-07 MED ORDER — ACETAMINOPHEN 500 MG PO TABS
500.0000 mg | ORAL_TABLET | Freq: Four times a day (QID) | ORAL | Status: DC | PRN
  Administered 2014-11-09: 500 mg via ORAL
  Filled 2014-11-07: qty 1

## 2014-11-07 MED ORDER — CHLORDIAZEPOXIDE HCL 5 MG PO CAPS
5.0000 mg | ORAL_CAPSULE | Freq: Two times a day (BID) | ORAL | Status: AC
  Administered 2014-11-08 – 2014-11-09 (×3): 5 mg via ORAL
  Filled 2014-11-07 (×3): qty 1

## 2014-11-07 NOTE — Progress Notes (Signed)
PROGRESS NOTE  [name] [name] [name] DOB: 05/20/1965 DOA: 11/04/2014 PCP: [name] Lewandowsky, MD  Assessment/Plan: Acute diastolic CHF -10/22/2012 echocardiogram EF 60% -11/07/2014 Repeat echocardiogram--EF 55-60%, grade 1 diastolic dysfunction -neg 2 L since admission -daily weight -d/c inderal -start coreg -continue IV lasix  -continue aldactone for now -Remains clinically volume overloaded Alcohol dependence -Patient had been on an alcohol binge for 1 week prior to admission -Alcohol withdrawal protocol -Wean Librium Diabetes mellitus type 2 -Diet controlled at home -Hemoglobin A1c 7.2 -Will need to start oral agents after discharge -Start NovoLog 4 units with meals -Continue NovoLog sliding scale Hypokalemia -Replete -Check magnesium--1.7 Epigastric abdominal pain -No melena or hematemesis -Hemoglobin stable -Continue PPI Thrombocytopenia -Secondary to chronic alcohol use and possible underlying cirrhosis -Monitor for signs of bleeding -Check serum B12--1223   Family Communication:   Pt at beside Disposition Plan:   Possible d/c 9/2 if stable       Procedures/Studies: Dg Chest 2 View  11/04/2014   CLINICAL DATA:  Acute onset of nausea, vomiting, generalized abdominal pain and bilateral lower leg swelling. Shortness of breath and dizziness. Initial encounter.  EXAM: CHEST  2 VIEW  COMPARISON:  Chest radiograph performed 09/12/2014  FINDINGS: The lungs are well-aerated. Vascular congestion is noted, with increased interstitial markings, concerning for mild interstitial edema. There is no evidence of pleural effusion or pneumothorax.  The heart is normal in size; the mediastinal contour is within normal limits. No acute osseous abnormalities are seen.  IMPRESSION: Vascular congestion, with increased interstitial markings, concerning for mild interstitial edema.   Electronically Signed   By: Roanna Raider M.D.   On: 11/04/2014 19:41          Subjective: Patient is overall breathing better. He states that he still has intermittent epigastric pain. Denies any fevers, chills, chest pain, vomiting, diarrhea, abdominal pain. He has some nausea.  Objective: Filed Vitals:   11/06/14 2123 11/07/14 0621 11/07/14 0947 11/07/14 1400  BP: 105/62 105/54 96/46 99/72   Pulse: 69 72 75 76  Temp: 98.4 F (36.9 C) 98.1 F (36.7 C) 98.2 F (36.8 C) 98.2 F (36.8 C)  TempSrc: Oral Oral Oral Oral  Resp: Height:      Weight:  99.292 kg (218 lb 14.4 oz)    SpO2: 92% 91% 93% 93%    Intake/Output Summary (Last 24 hours) at 11/07/14 1740 Last data filed at 11/07/14 1637  Gross per 24 hour  Intake   1260 ml  Output    325 ml  Net    935 ml   Weight change: -1.089 kg (-2 lb 6.4 oz) Exam:   General:  Pt is alert, follows commands appropriately, not in acute distress  HEENT: No icterus, No thrush, No neck mass, [place]/AT  Cardiovascular: RRR, S1/S2, no rubs, no gallops  Respiratory: Bibasilar crackles. No wheezing. Good air movement.  Abdomen: Soft/+BS, non tender, non distended, no guarding  Extremities: trace LE edema, No lymphangitis, No petechiae, No rashes, no synovitis  Data Reviewed: Basic Metabolic Panel:  Recent Labs Lab 11/04/14 1837 11/05/14 0545 11/06/14 0335 11/07/14 0612  NA 137 135 138 138  K 3.3* 3.3* 3.3* 3.5  CL 106 103 104 104  CO2 GLUCOSE 288* 148* 121* 129*  BUN <5* <5* <5* 6  CREATININE 0.48* 0.52* 0.58* 0.67  CALCIUM 7.6* 7.6* 8.3* 8.6*  MG  --   --  1.8 1.7  Liver Function Tests:  Recent Labs Lab 11/04/14 1837 11/05/14 0545 11/06/14 0335  AST 50* 47* 48*  ALT 31 29 32  ALKPHOS 67 69 81  BILITOT 2.3* 2.8* 3.6*  PROT 5.2* 5.5* 6.5  ALBUMIN 2.5* 2.5* 2.7*    Recent Labs Lab 11/04/14 1837  LIPASE 47   No results for input(s): AMMONIA in the last 168 hours. CBC:  Recent Labs Lab 11/04/14 1837 11/05/14 0545 11/06/14 0335  WBC 3.0* 3.0* 4.2   HGB 14.3 14.8 15.9  HCT 42.2 42.7 46.2  MCV 97.2 98.2 98.9  PLT 62* 63* 75*   Cardiac Enzymes: No results for input(s): CKTOTAL, CKMB, CKMBINDEX, TROPONINI in the last 168 hours. BNP: Invalid input(s): POCBNP CBG:  Recent Labs Lab 11/06/14 2121 11/07/14 0747 11/07/14 1143 11/07/14 1211 11/07/14 1639  GLUCAP 179* 185* 242* 263* 171*    No results found for this or any previous visit (from the past 240 hour(s)).   Scheduled Meds: . antiseptic oral rinse  7 mL Mouth Rinse BID  . carvedilol  3.125 mg Oral BID WC  . chlordiazePOXIDE  5 mg Oral TID  . folic acid  1 mg Oral Daily  . furosemide  40 mg Intravenous Q12H  . insulin aspart  0-15 Units Subcutaneous TID WC  . insulin aspart  0-5 Units Subcutaneous QHS  . insulin aspart  4 Units Subcutaneous TID WC  . lactulose  30 g Oral BID  . multivitamin with minerals  1 tablet Oral Daily  . pantoprazole (PROTONIX) IV  40 mg Intravenous Q12H  . potassium chloride  40 mEq Oral Daily  . sodium chloride  3 mL Intravenous Q12H  . spironolactone  25 mg Oral Daily  . thiamine  100 mg Oral Daily   Continuous Infusions:    [name] [name], [name], DO  [facility] Pager [phone]  If 7PM-7AM, please contact night-coverage [email or web address] Password [email or web address] 11/07/2014, 5:40 PM   LOS: 2 days

## 2014-11-07 NOTE — Progress Notes (Signed)
  Echocardiogram 2D Echocardiogram has been performed.  [name] [name] 11/07/2014, 10:46 AM

## 2014-11-08 ENCOUNTER — Inpatient Hospital Stay (HOSPITAL_COMMUNITY): Payer: Medicaid Other

## 2014-11-08 DIAGNOSIS — K7030 Alcoholic cirrhosis of liver without ascites: Secondary | ICD-10-CM

## 2014-11-08 DIAGNOSIS — I8510 Secondary esophageal varices without bleeding: Secondary | ICD-10-CM

## 2014-11-08 DIAGNOSIS — M79606 Pain in leg, unspecified: Secondary | ICD-10-CM

## 2014-11-08 LAB — BASIC METABOLIC PANEL
ANION GAP: 8 (ref 5–15)
BUN: 6 mg/dL (ref 6–20)
CALCIUM: 8.2 mg/dL — AB (ref 8.9–10.3)
CO2: 25 mmol/L (ref 22–32)
CREATININE: 0.66 mg/dL (ref 0.61–1.24)
Chloride: 102 mmol/L (ref 101–111)
GFR calc Af Amer: >60 mL/min (ref >60)
GLUCOSE: 120 mg/dL — AB (ref 65–99)
Potassium: 3.5 mmol/L (ref 3.5–5.1)
Sodium: 135 mmol/L (ref 135–145)

## 2014-11-08 LAB — GLUCOSE, CAPILLARY
GLUCOSE-CAPILLARY: 213 mg/dL — AB (ref 65–99)
Glucose-Capillary: 133 mg/dL — ABNORMAL HIGH (ref 65–99)
Glucose-Capillary: 123 mg/dL — ABNORMAL HIGH (ref 65–99)
Glucose-Capillary: 226 mg/dL — ABNORMAL HIGH (ref 65–99)
Glucose-Capillary: 175 mg/dL — ABNORMAL HIGH (ref 65–99)

## 2014-11-08 LAB — HEPATITIS B SURFACE ANTIGEN: Hepatitis B Surface Ag: NEGATIVE

## 2014-11-08 LAB — HEPATITIS C ANTIBODY: HCV Ab: 0.2 s/co ratio (ref 0.0–0.9)

## 2014-11-08 LAB — MAGNESIUM: Magnesium: 1.6 mg/dL — ABNORMAL LOW (ref 1.7–2.4)

## 2014-11-08 MED ORDER — FUROSEMIDE 10 MG/ML IJ SOLN
80.0000 mg | Freq: Two times a day (BID) | INTRAMUSCULAR | Status: DC
  Administered 2014-11-08 – 2014-11-09 (×2): 80 mg via INTRAVENOUS
  Filled 2014-11-08 (×2): qty 8

## 2014-11-08 MED ORDER — MAGNESIUM SULFATE 2 GM/50ML IV SOLN
2.0000 g | Freq: Once | INTRAVENOUS | Status: AC
  Administered 2014-11-08: 2 g via INTRAVENOUS
  Filled 2014-11-08: qty 50

## 2014-11-08 MED ORDER — CARVEDILOL 3.125 MG PO TABS
3.1250 mg | ORAL_TABLET | Freq: Two times a day (BID) | ORAL | Status: DC
  Administered 2014-11-08 – 2014-11-10 (×5): 3.125 mg via ORAL
  Filled 2014-11-08 (×5): qty 1

## 2014-11-08 NOTE — Progress Notes (Signed)
VASCULAR LAB PRELIMINARY  PRELIMINARY  PRELIMINARY  PRELIMINARY  Right lower extremity venous duplex completed.    Preliminary report:  Right:  No evidence of DVT, superficial thrombosis, or Baker's cyst.  [name] [name], RVT 11/08/2014, 4:41 PM

## 2014-11-08 NOTE — Progress Notes (Signed)
PROGRESS NOTE  [name] [name]:[id number] DOB: [birth year] DOA: 11/04/2014 PCP: Jeanann Lewandowsky, MD Assessment/Plan: Acute diastolic CHF -10/22/2012 echocardiogram EF 60% -11/07/2014 Repeat echocardiogram--EF 55-60%, grade 1 diastolic dysfunction -question I/O accuracy -daily weight--neg 4 pounds -d/c inderal -continue coreg -increase IV lasix to 80 mg bid -continue aldactone for now -Remains clinically volume overloaded Alcohol dependence -Patient had been on an alcohol binge for 1 week prior to admission -Alcohol withdrawal protocol -Wean Librium Diabetes mellitus type 2 -Diet controlled at home -Hemoglobin A1c 7.2 -Will need to start oral agents after discharge -Start NovoLog 4 units with meals -Continue NovoLog sliding scale Hypokalemia/Hypomagnesemia -Replete -Check magnesium--1.6-->replete Epigastric abdominal pain -No melena or hematemesis -Hemoglobin stable -Continue PPI Thrombocytopenia -Secondary to chronic alcohol use and possible underlying cirrhosis -Monitor for signs of bleeding -Check serum B12--1223   Family Communication: Pt at beside Disposition Plan: Possible d/c 9/3 if stable   Procedures/Studies: Dg Chest 2 View  11/04/2014   CLINICAL DATA:  Acute onset of nausea, vomiting, generalized abdominal pain and bilateral lower leg swelling. Shortness of breath and dizziness. Initial encounter.  EXAM: CHEST  2 VIEW  COMPARISON:  Chest radiograph performed 09/12/2014  FINDINGS: The lungs are well-aerated. Vascular congestion is noted, with increased interstitial markings, concerning for mild interstitial edema. There is no evidence of pleural effusion or pneumothorax.  The heart is normal in size; the mediastinal contour is within normal limits. No acute osseous abnormalities are seen.  IMPRESSION: Vascular congestion, with increased interstitial markings, concerning for mild interstitial edema.   Electronically Signed   By: Roanna Raider  M.D.   On: 11/04/2014 19:41         Subjective: Patient complains of loose stools. He denies any chest pain, vomiting, abdominal pain, fevers, chills, headache. He has some nausea. Denies any rashes, dizziness, syncope. No dysuria or hematuria. Denies any hematochezia or melena.  Objective: Filed Vitals:   11/07/14 2212 11/08/14 0500 11/08/14 1100 11/08/14 1400  BP: 113/56 100/44 110/50 113/55  Pulse: 74 71 80 80  Temp: 98.3 F (36.8 C) 97.7 F (36.5 C) 98 F (36.7 C) 98.2 F (36.8 C)  TempSrc: Oral Oral Oral Oral  Resp: Height:      Weight:  98.6 kg (217 lb 6 oz)    SpO2: 92% 97% 96% 92%    Intake/Output Summary (Last 24 hours) at 11/08/14 1738 Last data filed at 11/08/14 1733  Gross per 24 hour  Intake   1404 ml  Output   1575 ml  Net   -171 ml   Weight change: -0.692 kg (-1 lb 8.4 oz) Exam:   General:  Pt is alert, follows commands appropriately, not in acute distress  HEENT: No icterus, No thrush, No neck mass, [place]/AT  Cardiovascular: RRR, S1/S2, no rubs, no gallops  Respiratory: Bibasilar rales. No wheezing. Good air movement.  Abdomen: Soft/+BS, non tender, non distended, no guarding; no hepatosplenomegaly  Extremities: 1+LE edema, No lymphangitis, No petechiae, No rashes, no synovitis; no cyanosis or clubbing  Data Reviewed: Basic Metabolic Panel:  Recent Labs Lab 11/04/14 1837 11/05/14 0545 11/06/14 0335 11/07/14 0612 11/08/14 0404  NA 137 135 138 138 135  K 3.3* 3.3* 3.3* 3.5 3.5  CL 106 103 104 104 102  CO2 GLUCOSE 288* 148* 121* 129* 120*  BUN <5* <5* <5* 6 6  CREATININE 0.48* 0.52* 0.58* 0.67 0.66  CALCIUM 7.6* 7.6*  8.3* 8.6* 8.2*  MG  --   --  1.8 1.7 1.6*   Liver Function Tests:  Recent Labs Lab 11/04/14 1837 11/05/14 0545 11/06/14 0335  AST 50* 47* 48*  ALT 31 29 32  ALKPHOS 67 69 81  BILITOT 2.3* 2.8* 3.6*  PROT 5.2* 5.5* 6.5  ALBUMIN 2.5* 2.5* 2.7*    Recent Labs Lab 11/04/14 1837    LIPASE 47   No results for input(s): AMMONIA in the last 168 hours. CBC:  Recent Labs Lab 11/04/14 1837 11/05/14 0545 11/06/14 0335  WBC 3.0* 3.0* 4.2  HGB 14.3 14.8 15.9  HCT 42.2 42.7 46.2  MCV 97.2 98.2 98.9  PLT 62* 63* 75*   Cardiac Enzymes: No results for input(s): CKTOTAL, CKMB, CKMBINDEX, TROPONINI in the last 168 hours. BNP: Invalid input(s): POCBNP CBG:  Recent Labs Lab 11/07/14 2341 11/08/14 0350 11/08/14 0723 11/08/14 1132 11/08/14 1607  GLUCAP 169* 133* 123* 226* 175*    No results found for this or any previous visit (from the past 240 hour(s)).   Scheduled Meds: . antiseptic oral rinse  7 mL Mouth Rinse BID  . carvedilol  3.125 mg Oral BID WC  . chlordiazePOXIDE  5 mg Oral BID  . folic acid  1 mg Oral Daily  . furosemide  80 mg Intravenous BID  . insulin aspart  0-15 Units Subcutaneous TID WC  . insulin aspart  0-5 Units Subcutaneous QHS  . insulin aspart  4 Units Subcutaneous TID WC  . lactulose  30 g Oral BID  . multivitamin with minerals  1 tablet Oral Daily  . pantoprazole (PROTONIX) IV  40 mg Intravenous Q12H  . potassium chloride  40 mEq Oral Daily  . sodium chloride  3 mL Intravenous Q12H  . spironolactone  25 mg Oral Daily  . thiamine  100 mg Oral Daily   Continuous Infusions:    Iysis Germain, Epic, DO  [facility] Pager [phone]  If 7PM-7AM, please contact night-coverage [email or web address] Password [email or web address] 11/08/2014, 5:38 PM   LOS: 3 days

## 2014-11-08 NOTE — Care Management Note (Signed)
Case Management Note  Patient Details  Name: [name] [name] MRN: [id number] Date of Birth: [birth year]  Subjective/Objective:         Admitted with CHF           Action/Plan: Talked to patient via Spanish Interpretor Lanora Manis. Patient lives with his sister and she pays all of the bills, provide food for the patient. Patient had Medicaid but it expired November 06, 2014. Financial Counselor in to talk to patient and he is reapplying for Medicaid. 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 Patient is an active patient with the [facility] for follow up medical card and a apt is to be made for follow up care. Prescriptions can be filled at the [facility] at discharge. Patient stated that his sister can pay for his medication if discharged over the weekend. No DME needed. CM also talked to the patient about his ETOH abuse. He stated that he will no longer drink.  Expected Discharge Date:    possibly 11/09/2014              Expected Discharge Plan:  Home/Self Care  Discharge planning Services  CM Consult  Status of Service:  In process, will continue to follow  Reola Mosher [phone] 11/08/2014, 10:20 AM

## 2014-11-09 LAB — BASIC METABOLIC PANEL
ANION GAP: 6 (ref 5–15)
BUN: 8 mg/dL (ref 6–20)
CO2: 27 mmol/L (ref 22–32)
Calcium: 8.3 mg/dL — ABNORMAL LOW (ref 8.9–10.3)
Chloride: 100 mmol/L — ABNORMAL LOW (ref 101–111)
Creatinine, Ser: 0.63 mg/dL (ref 0.61–1.24)
GFR calc Af Amer: >60 mL/min (ref >60)
GLUCOSE: 256 mg/dL — AB (ref 65–99)
POTASSIUM: 3.6 mmol/L (ref 3.5–5.1)
Sodium: 133 mmol/L — ABNORMAL LOW (ref 135–145)

## 2014-11-09 LAB — GLUCOSE, CAPILLARY
GLUCOSE-CAPILLARY: 222 mg/dL — AB (ref 65–99)
GLUCOSE-CAPILLARY: 247 mg/dL — AB (ref 65–99)
Glucose-Capillary: 162 mg/dL — ABNORMAL HIGH (ref 65–99)
Glucose-Capillary: 157 mg/dL — ABNORMAL HIGH (ref 65–99)
Glucose-Capillary: 233 mg/dL — ABNORMAL HIGH (ref 65–99)

## 2014-11-09 LAB — MAGNESIUM: MAGNESIUM: 2 mg/dL (ref 1.7–2.4)

## 2014-11-09 LAB — TROPONIN I
Troponin I: <0.03 ng/mL (ref <0.031)
Troponin I: <0.03 ng/mL (ref <0.031)

## 2014-11-09 MED ORDER — FUROSEMIDE 40 MG PO TABS
40.0000 mg | ORAL_TABLET | Freq: Every day | ORAL | Status: DC
  Administered 2014-11-10: 40 mg via ORAL
  Filled 2014-11-09: qty 1

## 2014-11-09 MED ORDER — CARVEDILOL 3.125 MG PO TABS: 3.1250 mg | ORAL_TABLET | Freq: Two times a day (BID) | ORAL | Status: DC

## 2014-11-09 MED ORDER — LACTULOSE 10 GM/15ML PO SOLN: 20.0000 g | Freq: Two times a day (BID) | ORAL | Status: DC

## 2014-11-09 MED ORDER — FUROSEMIDE 10 MG/ML IJ SOLN
80.0000 mg | Freq: Once | INTRAMUSCULAR | Status: AC
  Administered 2014-11-09: 80 mg via INTRAVENOUS
  Filled 2014-11-09: qty 8

## 2014-11-09 MED ORDER — SPIRONOLACTONE 25 MG PO TABS: 25.0000 mg | ORAL_TABLET | Freq: Every day | ORAL | Status: DC

## 2014-11-09 NOTE — Progress Notes (Signed)
PROGRESS NOTE  [name] [name] ZOX:[id number] DOB: 1965/03/24 DOA: 11/04/2014 PCP: Jeanann Lewandowsky, MD  Assessment/Plan: Acute diastolic CHF -10/22/2012 echocardiogram EF 60% -11/07/2014 Repeat echocardiogram--EF 55-60%, grade 1 diastolic dysfunction -question I/O accuracy -daily weight--neg 4 pounds -d/c inderal -continue coreg -increase IV lasix to 80 mg bid -continue aldactone for now -Remains clinically volume overloaded but continues to improve Alcohol dependence -Patient had been on an alcohol binge for 1 week prior to admission -Alcohol withdrawal protocol -Wean Librium Diabetes mellitus type 2 -Diet controlled at home -Hemoglobin A1c 7.2 -Will need to start oral agents after discharge -Start NovoLog 4 units with meals -Continue NovoLog sliding scale Hypokalemia/Hypomagnesemia -Replete -Check magnesium--1.6-->replete Epigastric abdominal pain -No melena or hematemesis -Hemoglobin stable -Continue PPI Thrombocytopenia -Secondary to chronic alcohol use and possible underlying cirrhosis -Monitor for signs of bleeding -Check serum B12--1223   Family Communication: Pt at beside Disposition Plan: Possible d/c 9/4 if stable         Procedures/Studies: Dg Chest 2 View  11/04/2014   CLINICAL DATA:  Acute onset of nausea, vomiting, generalized abdominal pain and bilateral lower leg swelling. Shortness of breath and dizziness. Initial encounter.  EXAM: CHEST  2 VIEW  COMPARISON:  Chest radiograph performed 09/12/2014  FINDINGS: The lungs are well-aerated. Vascular congestion is noted, with increased interstitial markings, concerning for mild interstitial edema. There is no evidence of pleural effusion or pneumothorax.  The heart is normal in size; the mediastinal contour is within normal limits. No acute osseous abnormalities are seen.  IMPRESSION: Vascular congestion, with increased interstitial markings, concerning for mild interstitial edema.    Electronically Signed   By: Roanna Raider M.D.   On: 11/04/2014 19:41         Subjective: Patient still has some dyspnea on exertion but improving. Vomiting, diarrhea, abdominal pain, dysuria, hematuria. He is having loose stool. Denies any fevers or chills.  Objective: Filed Vitals:   11/08/14 1100 11/08/14 1400 11/08/14 2100 11/09/14 0615  BP: 110/50 113/55 106/62 94/50  Pulse: 80 80 78 81  Temp: 98 F (36.7 C) 98.2 F (36.8 C) 98.6 F (37 C) 98 F (36.7 C)  TempSrc: Oral Oral Oral Oral  Resp: Height:      Weight:    98.657 kg (217 lb 8 oz)  SpO2: 96% 92% 93% 98%    Intake/Output Summary (Last 24 hours) at 11/09/14 0836 Last data filed at 11/09/14 0800  Gross per 24 hour  Intake    960 ml  Output   1625 ml  Net   -665 ml   Weight change: 0.057 kg (2 oz) Exam:   General:  Pt is alert, follows commands appropriately, not in acute distress  HEENT: No icterus, No thrush, No neck mass, [place]/AT  Cardiovascular: RRR, S1/S2, no rubs, no gallops  Respiratory: CTA bilaterally, no wheezing, no crackles, no rhonchi  Abdomen: Soft/+BS, non tender, non distended, no guarding  Extremities: trace LE edema, No lymphangitis, No petechiae, No rashes, no synovitis  Data Reviewed: Basic Metabolic Panel:  Recent Labs Lab 11/05/14 0545 11/06/14 0335 11/07/14 0612 11/08/14 0404 11/09/14 0311  NA 135 138 138 135 133*  K 3.3* 3.3* 3.5 3.5 3.6  CL 103 104 104 102 100*  CO2 GLUCOSE 148* 121* 129* 120* 256*  BUN <5* <5* CREATININE 0.52* 0.58* 0.67 0.66 0.63  CALCIUM 7.6* 8.3* 8.6* 8.2* 8.3*  MG  --  1.8 1.7 1.6* 2.0   Liver Function Tests:  Recent Labs Lab 11/04/14 1837 11/05/14 0545 11/06/14 0335  AST 50* 47* 48*  ALT 31 29 32  ALKPHOS 67 69 81  BILITOT 2.3* 2.8* 3.6*  PROT 5.2* 5.5* 6.5  ALBUMIN 2.5* 2.5* 2.7*    Recent Labs Lab 11/04/14 1837  LIPASE 47   No results for input(s): AMMONIA in the last 168  hours. CBC:  Recent Labs Lab 11/04/14 1837 11/05/14 0545 11/06/14 0335  WBC 3.0* 3.0* 4.2  HGB 14.3 14.8 15.9  HCT 42.2 42.7 46.2  MCV 97.2 98.2 98.9  PLT 62* 63* 75*   Cardiac Enzymes: No results for input(s): CKTOTAL, CKMB, CKMBINDEX, TROPONINI in the last 168 hours. BNP: Invalid input(s): POCBNP CBG:  Recent Labs Lab 11/08/14 1132 11/08/14 1607 11/08/14 2137 11/09/14 0617 11/09/14 0722  GLUCAP 226* 175* 213* 162* 157*    No results found for this or any previous visit (from the past 240 hour(s)).   Scheduled Meds: . antiseptic oral rinse  7 mL Mouth Rinse BID  . carvedilol  3.125 mg Oral BID WC  . chlordiazePOXIDE  5 mg Oral BID  . folic acid  1 mg Oral Daily  . furosemide  80 mg Intravenous Once  . [START ON 11/10/2014] furosemide  40 mg Oral Daily  . insulin aspart  0-15 Units Subcutaneous TID WC  . insulin aspart  0-5 Units Subcutaneous QHS  . insulin aspart  4 Units Subcutaneous TID WC  . lactulose  30 g Oral BID  . multivitamin with minerals  1 tablet Oral Daily  . pantoprazole (PROTONIX) IV  40 mg Intravenous Q12H  . potassium chloride  40 mEq Oral Daily  . sodium chloride  3 mL Intravenous Q12H  . spironolactone  25 mg Oral Daily  . thiamine  100 mg Oral Daily   Continuous Infusions:    Maninder Deboer, Abiel, DO  [facility] Pager [phone]  If 7PM-7AM, please contact night-coverage [email or web address] Password [email or web address] 11/09/2014, 8:36 AM   LOS: 4 days

## 2014-11-09 NOTE — Discharge Summary (Addendum)
Physician Discharge Summary  [name] [name] ZOX:[id number] DOB: 11-May-1965 DOA: 11/04/2014  PCP: Jeanann Lewandowsky, MD  Admit date: 11/04/2014 Discharge date: 11/10/14 Recommendations for Outpatient Follow-up:  1. Pt will need to follow up with PCP in 2 weeks post discharge 2. Please obtain BMP 1-2 weeks Discharge Diagnoses:  Acute diastolic CHF -10/22/2012 echocardiogram EF 60% -11/07/2014 Repeat echocardiogram--EF 55-60%, grade 1 diastolic dysfunction -question I/O accuracy -daily weight--neg 5 pounds -d/c inderal -continue coreg -increased IV lasix to 80 mg bid during the hospitalization -home with lasix 40 mg po daily -continue aldactone for now -Remains clinically volume overloaded but continues to improve Alcohol dependence -Patient had been on an alcohol binge for 1 week prior to admission -Alcohol withdrawal protocol -Weaned Librium off during the hospitalization Diabetes mellitus type 2 -Diet controlled at home -Hemoglobin A1c 7.2 -Start metformin after discharge -Start NovoLog 4 units with meals -Continue NovoLog sliding scale Hypokalemia/Hypomagnesemia -Repleted -Check magnesium--1.6-->repleted Epigastric abdominal pain -No melena or hematemesis -Hemoglobin stable -Continue PPI during the hospitalization Thrombocytopenia -Secondary to chronic alcohol use and possible underlying cirrhosis -Monitor for signs of bleeding--none -Check serum B12--1223  Discharge Condition: stable  Disposition: home Follow-up Information    Follow up with  [facility] On 11/18/2014.   Why:  Appt. @ 2;15pm   Contact information:   [address] [place] [address] [phone]      Diet:heart healthy Wt Readings from Last 3 Encounters:  11/10/14 98.204 kg (216 lb 8 oz)  12/18/13 110.678 kg (244 lb)  07/24/13 105.688 kg (233 lb)    History of present illness:  49 year old male with a history of alcoholic cirrhosis, continue  alcohol use, diabetes mellitus, and GERD presented with 2-3 day history of worsening shortness of breath. The patient states that he had been drinking 12 cans of beer daily with his last drink earlier on the day of admission. He had noted increasing lower extremity edema and orthopnea. The patient was treated for CHF exacerbation. He was started on intravenous furosemide. The dose of intravenous furosemide was titrated up to achieve optimal clinical effect. He improved clinically. Echocardiogram showed preserved LV function. The patient was initially started on Librium for preemptive treatment of delirium tremens. The patient did not have any signs of withdrawal. His Librium was weaned off.  Discharge Exam: Filed Vitals:   11/10/14 0835  BP: 108/67  Pulse: 87  Temp:   Resp:    Filed Vitals:   11/09/14 1621 11/09/14 2138 11/10/14 0429 11/10/14 0835  BP: 114/61 115/45 123/62 108/67  Pulse: 78 79 79 87  Temp: 98.6 F (37 C) 98.3 F (36.8 C) 98 F (36.7 C)   TempSrc: Oral Oral Oral   Resp:  18 18   Height:      Weight:   98.204 kg (216 lb 8 oz)   SpO2: 94% 93% 93% 94%   General: A&O x 3, NAD, pleasant, cooperative Cardiovascular: RRR, no rub, no gallop, no S3 Respiratory: CTAB, no wheeze, no rhonchi Abdomen:soft, nontender, nondistended, positive bowel sounds Extremities: trace LE edema, No lymphangitis, no petechiae  Discharge Instructions      Discharge Instructions    Diet - low sodium heart healthy    Complete by:  As directed      Increase activity slowly    Complete by:  As directed             Medication List    STOP taking these medications  acetaminophen 325 MG tablet  Commonly known as:  TYLENOL      TAKE these medications        carvedilol 3.125 MG tablet  Commonly known as:  COREG  Take 1 tablet (3.125 mg total) by mouth 2 (two) times daily with a meal.     furosemide 40 MG tablet  Commonly known as:  LASIX  Take 1 tablet (40 mg total) by mouth  daily.     lactulose 10 GM/15ML solution  Commonly known as:  CHRONULAC  Take 30 mLs (20 g total) by mouth 2 (two) times daily.     metFORMIN 500 MG tablet  Commonly known as:  GLUCOPHAGE  Take 1 tablet (500 mg total) by mouth 2 (two) times daily with a meal.     potassium chloride 10 MEQ tablet  Commonly known as:  K-DUR  Take 1 tablet (10 mEq total) by mouth daily.     spironolactone 25 MG tablet  Commonly known as:  ALDACTONE  Take 1 tablet (25 mg total) by mouth daily.         The results of significant diagnostics from this hospitalization (including imaging, microbiology, ancillary and laboratory) are listed below for reference.    Significant Diagnostic Studies: Dg Chest 2 View  11/04/2014   CLINICAL DATA:  Acute onset of nausea, vomiting, generalized abdominal pain and bilateral lower leg swelling. Shortness of breath and dizziness. Initial encounter.  EXAM: CHEST  2 VIEW  COMPARISON:  Chest radiograph performed 09/12/2014  FINDINGS: The lungs are well-aerated. Vascular congestion is noted, with increased interstitial markings, concerning for mild interstitial edema. There is no evidence of pleural effusion or pneumothorax.  The heart is normal in size; the mediastinal contour is within normal limits. No acute osseous abnormalities are seen.  IMPRESSION: Vascular congestion, with increased interstitial markings, concerning for mild interstitial edema.   Electronically Signed   By: Roanna Raider M.D.   On: 11/04/2014 19:41     Microbiology: No results found for this or any previous visit (from the past 240 hour(s)).   Labs: Basic Metabolic Panel:  Recent Labs Lab 11/06/14 0335 11/07/14 0612 11/08/14 0404 11/09/14 0311 11/10/14 0410  NA 138 138 135 133* 135  K 3.3* 3.5 3.5 3.6 3.7  CL 104 104 102 100* 103  CO2 GLUCOSE 121* 129* 120* 256* 151*  BUN <5* CREATININE 0.58* 0.67 0.66 0.63 0.62  CALCIUM 8.3* 8.6* 8.2* 8.3* 8.5*  MG 1.8 1.7  1.6* 2.0  --    Liver Function Tests:  Recent Labs Lab 11/04/14 1837 11/05/14 0545 11/06/14 0335  AST 50* 47* 48*  ALT 31 29 32  ALKPHOS 67 69 81  BILITOT 2.3* 2.8* 3.6*  PROT 5.2* 5.5* 6.5  ALBUMIN 2.5* 2.5* 2.7*    Recent Labs Lab 11/04/14 1837  LIPASE 47   No results for input(s): AMMONIA in the last 168 hours. CBC:  Recent Labs Lab 11/04/14 1837 11/05/14 0545 11/06/14 0335  WBC 3.0* 3.0* 4.2  HGB 14.3 14.8 15.9  HCT 42.2 42.7 46.2  MCV 97.2 98.2 98.9  PLT 62* 63* 75*   Cardiac Enzymes:  Recent Labs Lab 11/09/14 0935 11/09/14 1545 11/09/14 2028  TROPONINI <0.03 <0.03 <0.03   BNP: Invalid input(s): POCBNP CBG:  Recent Labs Lab 11/09/14 0722 11/09/14 1117 11/09/14 1610 11/09/14 2141 11/10/14 0635  GLUCAP 157* 222* 247* 233* 118*    Time coordinating discharge:  Greater  than 30 minutes  Signed:  Crysta Gulick, Alf, DO [facility] Pager: [phone] 11/10/2014, 10:57 AM

## 2014-11-10 LAB — BASIC METABOLIC PANEL
Anion gap: 6 (ref 5–15)
BUN: 7 mg/dL (ref 6–20)
CALCIUM: 8.5 mg/dL — AB (ref 8.9–10.3)
CHLORIDE: 103 mmol/L (ref 101–111)
CO2: 26 mmol/L (ref 22–32)
CREATININE: 0.62 mg/dL (ref 0.61–1.24)
GFR calc Af Amer: >60 mL/min (ref >60)
GFR calc non Af Amer: >60 mL/min (ref >60)
Glucose, Bld: 151 mg/dL — ABNORMAL HIGH (ref 65–99)
Potassium: 3.7 mmol/L (ref 3.5–5.1)
SODIUM: 135 mmol/L (ref 135–145)

## 2014-11-10 LAB — GLUCOSE, CAPILLARY
GLUCOSE-CAPILLARY: 118 mg/dL — AB (ref 65–99)
Glucose-Capillary: 317 mg/dL — ABNORMAL HIGH (ref 65–99)

## 2014-11-10 MED ORDER — FUROSEMIDE 40 MG PO TABS: 40.0000 mg | ORAL_TABLET | Freq: Every day | ORAL | Status: DC

## 2014-11-10 MED ORDER — POTASSIUM CHLORIDE ER 10 MEQ PO TBCR: 10.0000 meq | EXTENDED_RELEASE_TABLET | Freq: Every day | ORAL | Status: DC

## 2014-11-10 MED ORDER — METFORMIN HCL 500 MG PO TABS: 500.0000 mg | ORAL_TABLET | Freq: Two times a day (BID) | ORAL | Status: DC

## 2014-11-10 NOTE — Progress Notes (Signed)
Pt. given discharge instructions questions answered. IV and tele removed. Taken out via wheelchair.

## 2014-11-18 ENCOUNTER — Ambulatory Visit (INDEPENDENT_AMBULATORY_CARE_PROVIDER_SITE_OTHER): Payer: Self-pay | Admitting: Family Medicine

## 2014-11-18 ENCOUNTER — Encounter: Payer: Self-pay | Admitting: Family Medicine

## 2014-11-18 VITALS — BP 127/64 | HR 83 | Temp 98.3°F | Resp 16 | Ht 71.0 in | Wt 229.0 lb

## 2014-11-18 DIAGNOSIS — E118 Type 2 diabetes mellitus with unspecified complications: Secondary | ICD-10-CM

## 2014-11-18 DIAGNOSIS — K7030 Alcoholic cirrhosis of liver without ascites: Secondary | ICD-10-CM

## 2014-11-18 LAB — GLUCOSE, CAPILLARY: GLUCOSE-CAPILLARY: 214 mg/dL — AB (ref 65–99)

## 2014-11-18 MED ORDER — METFORMIN HCL 500 MG PO TABS: 500.0000 mg | ORAL_TABLET | Freq: Two times a day (BID) | ORAL | Status: DC

## 2014-11-18 MED ORDER — FUROSEMIDE 40 MG PO TABS: 40.0000 mg | ORAL_TABLET | Freq: Every day | ORAL | Status: DC

## 2014-11-18 MED ORDER — POTASSIUM CHLORIDE ER 10 MEQ PO TBCR: 10.0000 meq | EXTENDED_RELEASE_TABLET | Freq: Every day | ORAL | Status: DC

## 2014-11-18 MED ORDER — SPIRONOLACTONE 25 MG PO TABS: 25.0000 mg | ORAL_TABLET | Freq: Every day | ORAL | Status: DC

## 2014-11-18 MED ORDER — CARVEDILOL 3.125 MG PO TABS: 3.1250 mg | ORAL_TABLET | Freq: Two times a day (BID) | ORAL | Status: DC

## 2014-11-18 MED ORDER — LACTULOSE 10 GM/15ML PO SOLN
20.0000 g | Freq: Two times a day (BID) | ORAL | Status: DC
Start: 2014-11-18 — End: 2015-03-08

## 2014-11-18 NOTE — Progress Notes (Signed)
Patient ID: [name] [name], male   DOB: 11/11/1965, 49 y.o.   MRN: [id number]   [name] [name], is a 49 y.o. male  WIO:[id number]  EQA:[id number]  DOB - Jan 19, 1966  CC:  Chief Complaint  Patient presents with  . Establish Care    not sleeping well        HPI: [name] [name] is a 49 y.o. male here to establish care. He was in hospital  From 8/29-9/4. His discharge note is below. He has a long history of alcohol abuse, alcoholic cirrhoris, alcoholic enceph,  Diastolic CHF, diabetes, Hypokalemia, hypomagnesemia. He has a history of non-compliance with medical regimine. He is a very poor historian. He states he had not had his medications for about 5 months prior to going to ED for fluid overload and difficulty breathing.   Admit date: 11/04/2014 Discharge date: 11/10/14 Recommendations for Outpatient Follow-up:  1. Pt will need to follow up with PCP in 2 weeks post discharge 2. Please obtain BMP 1-2 weeks Discharge Diagnoses:  Acute diastolic CHF -10/22/2012 echocardiogram EF 60% -11/07/2014 Repeat echocardiogram--EF 55-60%, grade 1 diastolic dysfunction -question I/O accuracy -daily weight--neg 5 pounds -d/c inderal -continue coreg -increased IV lasix to 80 mg bid during the hospitalization -home with lasix 40 mg po daily -continue aldactone for now -Remains clinically volume overloaded but continues to improve Alcohol dependence -Patient had been on an alcohol binge for 1 week prior to admission -Alcohol withdrawal protocol -Weaned Librium off during the hospitalization Diabetes mellitus type 2 -Diet controlled at home -Hemoglobin A1c 7.2 -Start metformin after discharge -Start NovoLog 4 units with meals -Continue NovoLog sliding scale Hypokalemia/Hypomagnesemia -Repleted -Check magnesium--1.6-->repleted Epigastric abdominal pain -No melena or hematemesis -Hemoglobin stable -Continue PPI during the hospitalization Thrombocytopenia -Secondary to chronic alcohol use and  possible underlying cirrhosis -Monitor for signs of bleeding--none -Check serum B12--1223  Discharge Condition: stable  Disposition: home Follow-up Information    Follow up with [facility] On 11/18/2014.   Why: Appt. @ 2;15pm   Contact information:   [address] [place] [address] [phone]      Diet:heart healthy Wt Readings from Last 3 Encounters:  11/10/14 98.204 kg (216 lb 8 oz)  12/18/13 110.678 kg (244 lb)  07/24/13 105.688 kg (233 lb)    History of present illness:  49 year old male with a history of alcoholic cirrhosis, continue alcohol use, diabetes mellitus, and GERD presented with 2-3 day history of worsening shortness of breath. The patient states that he had been drinking 12 cans of beer daily with his last drink earlier on the day of admission. He had noted increasing lower extremity edema and orthopnea. The patient was treated for CHF exacerbation. He was started on intravenous furosemide. The dose of intravenous furosemide was titrated up to achieve optimal clinical effect. He improved clinically. Echocardiogram showed preserved LV function. The patient was initially started on Librium for preemptive treatment of delirium tremens. The patient did not have any signs of withdrawal. His Librium was weaned off.           No Known Allergies Past Medical History  Diagnosis Date  . Shortness of breath   . Sleep apnea   . Diabetes mellitus without complication   . GERD (gastroesophageal reflux disease)   . H/O hiatal hernia   . Seizures   . Headache(784.0)   . No pertinent past medical history   . Cirrhosis   . Alcohol abuse    No current outpatient prescriptions on  file prior to visit.   No current facility-administered medications on file prior to visit.   No family history on file. Social History   Social History  . Marital Status: Single    Spouse Name: N/A  . Number of Children: N/A   . Years of Education: N/A   Occupational History  . Not on file.   Social History Main Topics  . Smoking status: Former Smoker -- 0.25 packs/day for 10 years    Types: Cigarettes    Quit date: 09/30/2007  . Smokeless tobacco: Never Used  . Alcohol Use: Yes     Comment: heavily x 7 days  . Drug Use: No  . Sexual Activity: Not Currently   Other Topics Concern  . Not on file   Social History Narrative    Review of Systems: Constitutional: Negative for fever, chills, appetite change, weight loss. Positive for fatigue. HENT: Negative for ear pain, ear discharge.nose bleeds. Some humming in ears. Eyes: Negative for pain, discharge, redness, itching and visual disturbance.Some blurred vision Neck: Negative for pain, stiffness Respiratory: Negative for cough, shortness of breath,   Cardiovascular: Positive  for chest pain, palpitations and leg swelling.Chest pain is sharp, stabbing and short lived Gastrointestinal: Negative for abdominal distention, abdominal pain, nausea, vomiting, constipations. Positive for diarrhea, heartburn. Genitourinary: Negative for dysuria, urgency, hematuria, flank pain. Positive for frequency. Musculoskeletal: Negative for back pain, joint pain, joint  swelling, arthralgia and gait problem.Negative for weakness.Positive for knee pain, hand pain,  Neurological: Positive for dizziness, headaches. Negative  tremors, seizures, syncope,   light-headedness, numbness  Hematological: Negative for easy bruising or bleeding Psychiatric/Behavioral: Negative for depression, anxiety, decreased concentration. Positive for memory problems.    Objective:   Filed Vitals:   11/18/14 1442  BP: 127/64  Pulse: 83  Temp: 98.3 F (36.8 C)  Resp: 16    Physical Exam: Constitutional: Patient appears well-developed and well-nourished. No distress.Unkempt. HENT: Normocephalic, atraumatic, External right and left ear normal. Oropharynx is clear and moist. Teeth in poor  repair Eyes:  EOM are normal. PERRLA, no scleral icterus.Conjunctiva injected.  Neck: Normal ROM. Neck supple. No lymphadenopathy, No thyromegaly. CVS: RRR, S1/S2 +, no murmurs, no gallops, no rubs. Lower legs and ankle edema Pulmonary: Effort and breath sounds normal, no stridor, rhonchi, wheezes, rales.  Abdominal: Soft. Normoactive BS,, no distension, tenderness, rebound or guarding.  Musculoskeletal: Normal range of motion. No edema and no tenderness.  Neuro: Alert.Normal muscle tone coordination. Non-focal Skin: Skin is warm and dry. No rash noted. Not diaphoretic. No erythema. No pallor. Psychiatric: Normal mood and affect. Behavior, judgment, thought content normal.  Lab Results  Component Value Date   WBC 4.2 11/06/2014   HGB 15.9 11/06/2014   HCT 46.2 11/06/2014   MCV 98.9 11/06/2014   PLT 75* 11/06/2014   Lab Results  Component Value Date   CREATININE 0.62 11/10/2014   BUN 7 11/10/2014   NA 135 11/10/2014   K 3.7 11/10/2014   CL 103 11/10/2014   CO2 26 11/10/2014    Lab Results  Component Value Date   HGBA1C 7.2* 11/05/2014   Lipid Panel     Component Value Date/Time   CHOL 137 11/16/2012 1443   TRIG 122 11/16/2012 1443   HDL 55 11/16/2012 1443   CHOLHDL 2.5 11/16/2012 1443   VLDL 24 11/16/2012 1443   LDLCALC 58 11/16/2012 1443       Assessment and plan:   1. Alcoholic cirrhosis of liver without ascites  - Hepatitis  C Antibody - COMPLETE METABOLIC PANEL WITH GFR - CBC with Differential - Vitamin D 1,25 dihydroxy - PSA - Lipid panel - spironolactone (ALDACTONE) 25 MG tablet; Take 1 tablet (25 mg total) by mouth daily.  Dispense: 30 tablet; Refill: 3 - lactulose (CHRONULAC) 10 GM/15ML solution; Take 30 mLs (20 g total) by mouth 2 (two) times daily.  Dispense: 946 mL; Refill: 1  2. Type 2 diabetes mellitus with complication  - Glucose, capillary - Lipid panel - Ambulatory referral to Ophthalmology   Return in about 3 months (around  02/17/2015).  The patient was given clear instructions to go to ER or return to medical center if symptoms don't improve, worsen or new problems develop. The patient verbalized understanding.      Henrietta Hoover, MSN, FNP-BC   11/18/2014, 4:19 PM

## 2014-11-18 NOTE — Patient Instructions (Signed)
Take medications as prescribed and come back in one month for recheck. It will be in your best interest to stop drinking.

## 2014-11-19 LAB — CBC WITH DIFFERENTIAL/PLATELET
BASOS ABS: 0 10*3/uL (ref 0.0–0.1)
BASOS PCT: 1 % (ref 0–1)
EOS ABS: 0.1 10*3/uL (ref 0.0–0.7)
Eosinophils Relative: 3 % (ref 0–5)
HCT: 44.3 % (ref 39.0–52.0)
HEMOGLOBIN: 15.3 g/dL (ref 13.0–17.0)
Lymphocytes Relative: 19 % (ref 12–46)
Lymphs Abs: 0.8 10*3/uL (ref 0.7–4.0)
MCH: 33.1 pg (ref 26.0–34.0)
MCHC: 34.5 g/dL (ref 30.0–36.0)
MCV: 95.9 fL (ref 78.0–100.0)
MONOS PCT: 12 % (ref 3–12)
MPV: 11.8 fL (ref 8.6–12.4)
Monocytes Absolute: 0.5 10*3/uL (ref 0.1–1.0)
NEUTROS ABS: 2.7 10*3/uL (ref 1.7–7.7)
NEUTROS PCT: 65 % (ref 43–77)
PLATELETS: 60 10*3/uL — AB (ref 150–400)
RBC: 4.62 MIL/uL (ref 4.22–5.81)
RDW: 14.2 % (ref 11.5–15.5)
WBC: 4.1 10*3/uL (ref 4.0–10.5)

## 2014-11-19 LAB — LIPID PANEL
Cholesterol: 137 mg/dL (ref 125–200)
HDL: 61 mg/dL (ref >=40)
LDL CALC: 61 mg/dL (ref <130)
Total CHOL/HDL Ratio: 2.2 Ratio (ref <=5.0)
Triglycerides: 76 mg/dL (ref <150)
VLDL: 15 mg/dL (ref <30)

## 2014-11-19 LAB — COMPLETE METABOLIC PANEL WITH GFR
ALBUMIN: 3.2 g/dL — AB (ref 3.6–5.1)
ALK PHOS: 74 U/L (ref 40–115)
ALT: 33 U/L (ref 9–46)
AST: 36 U/L — ABNORMAL HIGH (ref 10–35)
BUN: 5 mg/dL — ABNORMAL LOW (ref 7–25)
CO2: 20 mmol/L (ref 20–31)
Calcium: 8.5 mg/dL — ABNORMAL LOW (ref 8.6–10.3)
Chloride: 109 mmol/L (ref 98–110)
Creat: 0.47 mg/dL — ABNORMAL LOW (ref 0.70–1.33)
GLUCOSE: 201 mg/dL — AB (ref 65–99)
POTASSIUM: 3.9 mmol/L (ref 3.5–5.3)
SODIUM: 136 mmol/L (ref 135–146)
Total Bilirubin: 2.1 mg/dL — ABNORMAL HIGH (ref 0.2–1.2)
Total Protein: 5.8 g/dL — ABNORMAL LOW (ref 6.1–8.1)

## 2014-11-19 LAB — MAGNESIUM: MAGNESIUM: 1.7 mg/dL (ref 1.5–2.5)

## 2014-11-19 LAB — PSA: PSA: 0.93 ng/mL (ref <=4.00)

## 2014-11-19 LAB — HEPATITIS C ANTIBODY: HCV AB: NEGATIVE

## 2014-11-21 LAB — VITAMIN D 1,25 DIHYDROXY
VITAMIN D 1, 25 (OH) TOTAL: 59 pg/mL (ref 18–72)
VITAMIN D3 1, 25 (OH): 59 pg/mL

## 2014-12-03 ENCOUNTER — Emergency Department (HOSPITAL_COMMUNITY)
Admission: EM | Admit: 2014-12-03 | Discharge: 2014-12-04 | Disposition: A | Discharge status: Home / Self Care | Payer: Self-pay | Attending: Emergency Medicine | Admitting: Emergency Medicine

## 2014-12-03 ENCOUNTER — Encounter (HOSPITAL_COMMUNITY): Payer: Self-pay | Admitting: Oncology

## 2014-12-03 DIAGNOSIS — R609 Edema, unspecified: Secondary | ICD-10-CM | POA: Insufficient documentation

## 2014-12-03 DIAGNOSIS — Z79899 Other long term (current) drug therapy: Secondary | ICD-10-CM | POA: Insufficient documentation

## 2014-12-03 DIAGNOSIS — F1020 Alcohol dependence, uncomplicated: Secondary | ICD-10-CM | POA: Insufficient documentation

## 2014-12-03 DIAGNOSIS — E876 Hypokalemia: Secondary | ICD-10-CM | POA: Insufficient documentation

## 2014-12-03 DIAGNOSIS — Z8719 Personal history of other diseases of the digestive system: Secondary | ICD-10-CM | POA: Insufficient documentation

## 2014-12-03 DIAGNOSIS — Z87891 Personal history of nicotine dependence: Secondary | ICD-10-CM | POA: Insufficient documentation

## 2014-12-03 DIAGNOSIS — Z7982 Long term (current) use of aspirin: Secondary | ICD-10-CM | POA: Insufficient documentation

## 2014-12-03 DIAGNOSIS — E119 Type 2 diabetes mellitus without complications: Secondary | ICD-10-CM | POA: Insufficient documentation

## 2014-12-03 DIAGNOSIS — Z8669 Personal history of other diseases of the nervous system and sense organs: Secondary | ICD-10-CM | POA: Insufficient documentation

## 2014-12-03 NOTE — ED Notes (Signed)
Per EMS pt developed b/l LE edema one week ago.

## 2014-12-03 NOTE — ED Notes (Signed)
Bed: ZO10 Expected date:  Expected time:  Means of arrival:  Comments: Lower leg swelling/etoh

## 2014-12-04 LAB — CBC WITH DIFFERENTIAL/PLATELET
BASOS ABS: 0 10*3/uL (ref 0.0–0.1)
BASOS PCT: 1 %
Eosinophils Absolute: 0.2 10*3/uL (ref 0.0–0.7)
Eosinophils Relative: 4 %
HEMATOCRIT: 45.2 % (ref 39.0–52.0)
HEMOGLOBIN: 15.8 g/dL (ref 13.0–17.0)
LYMPHS PCT: 35 %
Lymphs Abs: 1.5 10*3/uL (ref 0.7–4.0)
MCH: 33.8 pg (ref 26.0–34.0)
MCHC: 35 g/dL (ref 30.0–36.0)
MCV: 96.8 fL (ref 78.0–100.0)
Monocytes Absolute: 0.3 10*3/uL (ref 0.1–1.0)
Monocytes Relative: 7 %
NEUTROS ABS: 2.3 10*3/uL (ref 1.7–7.7)
NEUTROS PCT: 53 %
Platelets: 83 10*3/uL — ABNORMAL LOW (ref 150–400)
RBC: 4.67 MIL/uL (ref 4.22–5.81)
RDW: 13.9 % (ref 11.5–15.5)
WBC: 4.2 10*3/uL (ref 4.0–10.5)

## 2014-12-04 LAB — COMPREHENSIVE METABOLIC PANEL
ALBUMIN: 3.4 g/dL — AB (ref 3.5–5.0)
ALK PHOS: 76 U/L (ref 38–126)
ALT: 33 U/L (ref 17–63)
AST: 55 U/L — AB (ref 15–41)
Anion gap: 6 (ref 5–15)
BILIRUBIN TOTAL: 2 mg/dL — AB (ref 0.3–1.2)
CALCIUM: 7.6 mg/dL — AB (ref 8.9–10.3)
CO2: 27 mmol/L (ref 22–32)
CREATININE: 0.35 mg/dL — AB (ref 0.61–1.24)
Chloride: 109 mmol/L (ref 101–111)
GFR calc Af Amer: >60 mL/min (ref >60)
GFR calc non Af Amer: >60 mL/min (ref >60)
GLUCOSE: 151 mg/dL — AB (ref 65–99)
POTASSIUM: 3.2 mmol/L — AB (ref 3.5–5.1)
Sodium: 142 mmol/L (ref 135–145)
TOTAL PROTEIN: 6.9 g/dL (ref 6.5–8.1)

## 2014-12-04 LAB — TROPONIN I: Troponin I: <0.03 ng/mL (ref <0.031)

## 2014-12-04 MED ORDER — FUROSEMIDE 40 MG PO TABS
40.0000 mg | ORAL_TABLET | Freq: Once | ORAL | Status: AC
  Administered 2014-12-04: 40 mg via ORAL
  Filled 2014-12-04: qty 1

## 2014-12-04 MED ORDER — SODIUM CHLORIDE 0.9 % IV BOLUS (SEPSIS)
500.0000 mL | Freq: Once | INTRAVENOUS | Status: AC
  Administered 2014-12-04: 500 mL via INTRAVENOUS

## 2014-12-04 MED ORDER — OMEPRAZOLE 20 MG PO CPDR: 20.0000 mg | DELAYED_RELEASE_CAPSULE | Freq: Every day | ORAL | Status: DC

## 2014-12-04 MED ORDER — PANTOPRAZOLE SODIUM 40 MG IV SOLR
40.0000 mg | Freq: Once | INTRAVENOUS | Status: AC
  Administered 2014-12-04: 40 mg via INTRAVENOUS
  Filled 2014-12-04: qty 40

## 2014-12-04 MED ORDER — POTASSIUM CHLORIDE CRYS ER 20 MEQ PO TBCR
40.0000 meq | EXTENDED_RELEASE_TABLET | Freq: Once | ORAL | Status: AC
  Administered 2014-12-04: 40 meq via ORAL
  Filled 2014-12-04: qty 2

## 2014-12-04 MED ORDER — ONDANSETRON HCL 4 MG PO TABS: 4.0000 mg | ORAL_TABLET | Freq: Four times a day (QID) | ORAL | Status: DC

## 2014-12-04 NOTE — ED Notes (Signed)
Patient attempted to ambulate from restroom to treatment room unassisted. Patient became weak and grabbed the railing on wall. Patient was assisted by multiple staff into a wheelchair. Patient was unable to follow commands for several seconds. PA and MD at patient side in hallway when being assisted by staff. Patient assessed by PA. Patient c/o chest hurting, having episodes of dizziness and "passing out" over past two weeks. Patient legs with edema bilaterally. Repeat EKG was obtained per PA order and IV was placed. Patient c/o feeling cold, skin warm to the touch. Oral temp 97.6.

## 2014-12-04 NOTE — ED Provider Notes (Signed)
CSN: [id number]     Arrival date & time 12/03/14  2339 History   First MD Initiated Contact with Patient 12/04/14 0007     Chief Complaint  Patient presents with  . Leg Swelling     (Consider location/radiation/quality/duration/timing/severity/associated sxs/prior Treatment) HPI Comments: The patient complains of increased lower extremity swelling for the past week. He has had nausea and vomiting for the past 2 days with epigastric pain and reports seeing a small amount of blood in his emesis. No fever. No chest pain, SOB, diarrhea. He states he has not been taking his medication for the last several days because he has been drinking heavily and does not remember to take them.   The history is provided by the patient. No language interpreter was used.    Past Medical History  Diagnosis Date  . Shortness of breath   . Sleep apnea   . Diabetes mellitus without complication   . GERD (gastroesophageal reflux disease)   . H/O hiatal hernia   . Seizures   . Headache(784.0)   . No pertinent past medical history   . Cirrhosis   . Alcohol abuse    Past Surgical History  Procedure Laterality Date  . Appendectomy    . No past surgeries    . Radiology with anesthesia  01/30/2012    Procedure: RADIOLOGY WITH ANESTHESIA;  Surgeon: Casimiro Needle T. Miles Costain, MD;  Location: [facility] OR;  Service: Radiology;  Laterality: N/A;   No family history on file. Social History  Substance Use Topics  . Smoking status: Former Smoker -- 0.25 packs/day for 10 years    Types: Cigarettes    Quit date: 09/30/2007  . Smokeless tobacco: Never Used  . Alcohol Use: Yes     Comment: heavily x 7 days    Review of Systems  Constitutional: Negative for fever and chills.  Respiratory: Negative.   Cardiovascular: Positive for leg swelling.  Gastrointestinal: Positive for nausea, vomiting and abdominal pain. Negative for diarrhea.  Musculoskeletal: Negative.   Skin: Negative.   Neurological: Negative.        Allergies  Review of patient's allergies indicates no known allergies.  Home Medications   Prior to Admission medications   Medication Sig Start Date End Date Taking? Authorizing Provider  aspirin 325 MG tablet Take 325 mg by mouth daily.   Yes Historical Provider, MD  carvedilol (COREG) 3.125 MG tablet Take 1 tablet (3.125 mg total) by mouth 2 (two) times daily with a meal. 11/18/14  Yes Henrietta Hoover, NP  furosemide (LASIX) 40 MG tablet Take 1 tablet (40 mg total) by mouth daily. 11/18/14  Yes Henrietta Hoover, NP  lactulose (CHRONULAC) 10 GM/15ML solution Take 30 mLs (20 g total) by mouth 2 (two) times daily. 11/18/14  Yes Henrietta Hoover, NP  metFORMIN (GLUCOPHAGE) 500 MG tablet Take 1 tablet (500 mg total) by mouth 2 (two) times daily with a meal. 11/18/14  Yes Henrietta Hoover, NP  potassium chloride (K-DUR) 10 MEQ tablet Take 1 tablet (10 mEq total) by mouth daily. 11/18/14  Yes Henrietta Hoover, NP  spironolactone (ALDACTONE) 25 MG tablet Take 1 tablet (25 mg total) by mouth daily. 11/18/14  Yes Henrietta Hoover, NP   BP 118/59 mmHg  Pulse 83  Temp(Src) 97.6 F (36.4 C) (Oral)  Resp 23  SpO2 91% Physical Exam  Constitutional: He is oriented to person, place, and time. He appears well-developed and well-nourished. No distress.  HENT:  Head: Normocephalic.  Neck:  Normal range of motion. Neck supple.  Cardiovascular: Normal rate and regular rhythm.   Pulmonary/Chest: Effort normal and breath sounds normal. He has no wheezes. He has no rales.  Abdominal: Soft. Bowel sounds are normal. There is no tenderness. There is no rebound and no guarding.  Musculoskeletal: Normal range of motion.  Neurological: He is alert and oriented to person, place, and time.  Skin: Skin is warm and dry. No rash noted. He is not diaphoretic.  Psychiatric: He has a normal mood and affect.    ED Course  Procedures (including critical care time) Labs Review Labs Reviewed  CBC WITH  DIFFERENTIAL/PLATELET - Abnormal; Notable for the following:    Platelets 83 (*)    All other components within normal limits  COMPREHENSIVE METABOLIC PANEL - Abnormal; Notable for the following:    Potassium 3.2 (*)    Glucose, Bld 151 (*)    BUN <5 (*)    Creatinine, Ser 0.35 (*)    Calcium 7.6 (*)    Albumin 3.4 (*)    AST 55 (*)    Total Bilirubin 2.0 (*)    All other components within normal limits    Imaging Review No results found. I have personally reviewed and evaluated these images and lab results as part of my medical decision-making.   EKG Interpretation None      MDM   Final diagnoses:  None    1. Peripheral edema 2. Alcohol dependence 3. Weakness  The patient has been resting comfortably. No complaints. Labs pending.   3:20: Patient ambulated to bathroom when he became weak and threatened to fall. Caught by staff and returned to bed. No syncope. He complains of chest pain, stating he has had it since last night. No vomiting. Repeat EKG unchanged. Troponin negative.  Will continue to observe.   6:30:  The patient has been sleeping quietly. He is ambulated through the department without weakness. He reports mild dizziness that is significantly improved. VSS. He is felt stable for discharge home. He states he has his medications at home but was not taking them because he had been drinking heavily. He will resume medications as instructed.   Elpidio Anis, PA-C 12/04/14 1610  Loren Racer, MD 12/04/14 [phone]

## 2014-12-04 NOTE — Discharge Instructions (Signed)
Gastritis - Adultos  (Gastritis, Adult)  La gastrittis es la irritacin (inflamacin) de la membrana interna del estmago. Puede ser Neomia Dear enfermedad de inicio sbito (aguda) o de largo plazo (crnica). Si la gastritis no se trata, puede causar sangrado y lceras. CAUSAS  La gastritis se produce cuando la membrana que tapiza interiormente al estmago se debilita o se daa. Los jugos digestivos del estmago inflaman el revestimiento del estmago debilitado. El revestimiento del estmago puede debilitarse o daarse por una infeccin viral o bacteriana. La infeccin bacteriana ms comn es la infeccin por Helicobacter pylori. Tambin puede ser el resultado del consumo excesivo de alcohol, por el uso de ciertos medicamentos o porque hay demasiado cido en el estmago.  SNTOMAS  En algunos casos no hay sntomas. Si se presentan sntomas, stos pueden ser:   Dolor o sensacin de ardor en la parte superior del abdomen.  Nuseas.  Vmitos.  Sensacin molesta de distensin despus de comer. DIAGNSTICO  El mdico puede diagnosticar gastritis segn los sntomas y el examen fsico. Para determinar la causa de la gastritis, el mdico podr:   Pedir anlisis de sangre o de materia fecal para diagnosticar la presencia de la bacteria H pylori.  Gastroscopa. Un tubo delgado y flexible (endoscopio) se pasa por Theatre stage manager al [employer]. El endoscopio tiene [place] luz y una cmara en el extremo. El mdico utilizar el endoscopio para observar el interior del [place].  Tomar una muestra de tejido (biopsia) del estmago para examinarlo en el microscopio. TRATAMIENTO  Segn la causa de la gastritis podrn recetarle: Antibiticos, si la causa es una infeccin bacteriana, como una infeccin por H. pylori. Anticidos o bloqueadores H2, si hay demasiado cido en el estmago. El [employer] aconsejar que deje de tomar aspirina, ibuprofeno u otros antiinflamatorios no esteroides (AINE).  INSTRUCCIONES PARA EL  CUIDADO EN EL HOGAR   Tome slo medicamentos de venta libre o recetados, segn las indicaciones del mdico.  Si le han recetado antibiticos, tmelos segn las indicaciones. Tmelos todos, aunque se sienta mejor.  Debe ingerir gran cantidad de lquido para mantener la orina de tono claro o color amarillo plido.  Evite las comidas y bebidas que [address] [place], [place]:  [place] con cafena o alcohlicas.  Chocolate.  Sabores a Advertising account planner.  Ajo y cebolla.  Comidas muy condimentadas.  Ctricos como naranjas, limones o limas.  Alimentos que contengan tomate, como salsas, [place] y pizza.  Alimentos fritos y Lexicographer.  Haga comidas pequeas durante Glass blower/designer de 3 comidas abundantes. SOLICITE ATENCIN MDICA DE INMEDIATO SI:   La materia fecal es negra o de color rojo oscuro.  Vomita sangre de color rojo brillante o material similar a granos de caf.  No puede retener los lquidos.  El dolor abdominal empeora.  Tiene fiebre.  No mejora luego de 1 semana.  Tiene preguntas o preocupaciones. ASEGRESE DE QUE:   Comprende estas instrucciones.  Controlar su enfermedad.  Solicitar ayuda de inmediato si no mejora o si empeora. Document Released: 12/02/2004 Document Revised: 11/17/2011 [facility] Patient Information 2015 [place], [place]. This information is not intended to replace advice given to you by your health care provider. Make sure you discuss any questions you have with your health care provider. Cunto es demasiado alcohol?  (How Much is Too Much Alcohol?)  Beber mucho alcohol puede ser causa de traumatismos, accidentes y [address] de [place]. Los problemas que puede causar son:   Accidentes automovilsticos.  Cadas.  Peleas familiares (violencia domstica).  Ahogamiento.  Peleas (agresiones).  Lesiones.  Quemaduras.  Daos en algunos rganos.  Tener un beb con defectos congnitos. UN TRAGO PUEDE SER DEMASIADO SI:  Problemas laborales.  Est  embarazada o amamantando.  Est tomando medicamentos. Consulte a su mdico.  Est conduciendo o va a hacerlo. QU ES UNA BEBIDA ESTNDAR?   Una cerveza normal (12 onzas o 360 mililitros).  Una copa de vino (5 onzas o 150 mililitros).  Una copita de licor (1,5 onzas o 45 mililitros). LOS NIVELES DE ALCOHOL EN LA SANGRE Y SU CUERPO:   .00 Sobrio.  Marland Kitchen03 No hay problemas para mantener el equilibrio, hablar o Advice worker, pero puede sentir un "zumbido".  .05 Siente un "zumbido", se siente relajado, consumo social.  .08 o .10 La persona est alcoholizada. Tiene dificultad para hablar, ver correctamente y [employer] equilibrio.  .15 Una persona pierde el control de su cuerpo y puede perder el conocimiento (desmayarse).  .20 Tambaleo y vmitos.  .30 Desmayos (inconsciente).  Marland Kitchen40+ Coma, puede ocurrir [employer]. Si usted o alguien que usted conoce tiene problemas con la bebida, pdale ayuda a su mdico. Document Released: 03/27/2010 Document Revised: 05/17/2011 [employer] Patient Information 2015 [place], [place]. This information is not intended to replace advice given to you by your health care provider. Make sure you discuss any questions you have with your health care provider. Edema perifrico (Peripheral Edema) Usted sufre hinchazn en las piernas, lo que se denomina edema perifrico. Esta hinchazn se debe al exceso de acumulacin de sal y agua en el organismo. El edema puede ser un signo de enfermedad cardaca, renal o heptica, o el efecto secundario de un medicamento. Tambin puede deberse a otros problemas en las venas de las piernas. Si el problema se debe a una circulacin venosa deficiente, eleve las piernas y [place] medias especiales de soporte. Evite permanecer de pie durante largos perodos. El tratamiento depende de la causa. Los chips, pickles y otros alimentos salados debern evitarse. Casi siempre es necesario restringir la sal en la dieta. Le indicarn diurticos  para eliminar el exceso de sal y agua del organismo por la [place]. Estos medicamentos evitan que el rin absorba el sodio. Aumentan el flujo de [place]. El tratamiento con diurticos tambin [employer] niveles de potasio del [place]. Ser necesario que utilice suplementos de potasio si toma diurticos [employer]. Controle el peso diario para verificar los progresos en la mejora del edema. Comunquese con su mdico para realizar un control segn las indicaciones. SOLICITE ATENCIN MDICA DE INMEDIATO SI:  Aumenta en gran medida la hinchazn, el dolor, la inflamacin o el calor en las piernas.  Le falta el aire, especialmente estando [place].  Siente dolor en el pecho o en el abdomen, debilidad, o se marea.  Tiene fiebre. Document Released: 02/22/2005 Document Revised: 05/17/2011 [facility] Patient Information 2015 [place], [place]. This information is not intended to replace advice given to you by your health care provider. Make sure you discuss any questions you have with your health care provider.

## 2015-02-20 ENCOUNTER — Ambulatory Visit: Payer: Self-pay | Admitting: Family Medicine

## 2015-03-07 ENCOUNTER — Encounter (HOSPITAL_COMMUNITY): Payer: Self-pay | Admitting: Emergency Medicine

## 2015-03-07 DIAGNOSIS — F1010 Alcohol abuse, uncomplicated: Secondary | ICD-10-CM | POA: Insufficient documentation

## 2015-03-07 DIAGNOSIS — M542 Cervicalgia: Secondary | ICD-10-CM | POA: Insufficient documentation

## 2015-03-07 DIAGNOSIS — R42 Dizziness and giddiness: Secondary | ICD-10-CM | POA: Insufficient documentation

## 2015-03-07 DIAGNOSIS — K219 Gastro-esophageal reflux disease without esophagitis: Secondary | ICD-10-CM | POA: Insufficient documentation

## 2015-03-07 DIAGNOSIS — R600 Localized edema: Secondary | ICD-10-CM | POA: Insufficient documentation

## 2015-03-07 DIAGNOSIS — K7030 Alcoholic cirrhosis of liver without ascites: Secondary | ICD-10-CM | POA: Insufficient documentation

## 2015-03-07 DIAGNOSIS — E119 Type 2 diabetes mellitus without complications: Secondary | ICD-10-CM | POA: Insufficient documentation

## 2015-03-07 DIAGNOSIS — Z87891 Personal history of nicotine dependence: Secondary | ICD-10-CM | POA: Insufficient documentation

## 2015-03-07 DIAGNOSIS — R079 Chest pain, unspecified: Secondary | ICD-10-CM | POA: Insufficient documentation

## 2015-03-07 DIAGNOSIS — R51 Headache: Secondary | ICD-10-CM | POA: Insufficient documentation

## 2015-03-07 DIAGNOSIS — Z8669 Personal history of other diseases of the nervous system and sense organs: Secondary | ICD-10-CM | POA: Insufficient documentation

## 2015-03-07 NOTE — ED Notes (Signed)
Pt. requesting detox for his alcoholism , last drink today , denies suicidal ideation / no hallucinations .

## 2015-03-08 ENCOUNTER — Emergency Department (HOSPITAL_COMMUNITY): Payer: Self-pay

## 2015-03-08 ENCOUNTER — Emergency Department (HOSPITAL_COMMUNITY)
Admission: EM | Admit: 2015-03-08 | Discharge: 2015-03-08 | Disposition: A | Discharge status: Home / Self Care | Payer: Self-pay | Attending: Emergency Medicine | Admitting: Emergency Medicine

## 2015-03-08 DIAGNOSIS — R079 Chest pain, unspecified: Secondary | ICD-10-CM

## 2015-03-08 DIAGNOSIS — R609 Edema, unspecified: Secondary | ICD-10-CM

## 2015-03-08 DIAGNOSIS — F1010 Alcohol abuse, uncomplicated: Secondary | ICD-10-CM

## 2015-03-08 DIAGNOSIS — K7030 Alcoholic cirrhosis of liver without ascites: Secondary | ICD-10-CM

## 2015-03-08 LAB — COMPREHENSIVE METABOLIC PANEL
ALBUMIN: 3.4 g/dL — AB (ref 3.5–5.0)
ALK PHOS: 102 U/L (ref 38–126)
ALT: 87 U/L — AB (ref 17–63)
ANION GAP: 13 (ref 5–15)
AST: 165 U/L — AB (ref 15–41)
BILIRUBIN TOTAL: 2.8 mg/dL — AB (ref 0.3–1.2)
CALCIUM: 9.2 mg/dL (ref 8.9–10.3)
CO2: 25 mmol/L (ref 22–32)
CREATININE: 0.43 mg/dL — AB (ref 0.61–1.24)
Chloride: 106 mmol/L (ref 101–111)
GFR calc Af Amer: >60 mL/min (ref >60)
GFR calc non Af Amer: >60 mL/min (ref >60)
GLUCOSE: 175 mg/dL — AB (ref 65–99)
Potassium: 3.6 mmol/L (ref 3.5–5.1)
SODIUM: 144 mmol/L (ref 135–145)
Total Protein: 7.2 g/dL (ref 6.5–8.1)

## 2015-03-08 LAB — CBC WITH DIFFERENTIAL/PLATELET
BASOS ABS: 0 10*3/uL (ref 0.0–0.1)
BASOS PCT: 1 %
EOS ABS: 0.1 10*3/uL (ref 0.0–0.7)
EOS PCT: 2 %
HCT: 53.2 % — ABNORMAL HIGH (ref 39.0–52.0)
HEMOGLOBIN: 18.5 g/dL — AB (ref 13.0–17.0)
LYMPHS ABS: 1.4 10*3/uL (ref 0.7–4.0)
Lymphocytes Relative: 38 %
MCH: 34.4 pg — AB (ref 26.0–34.0)
MCHC: 34.8 g/dL (ref 30.0–36.0)
MCV: 98.9 fL (ref 78.0–100.0)
Monocytes Absolute: 0.3 10*3/uL (ref 0.1–1.0)
Monocytes Relative: 7 %
NEUTROS PCT: 52 %
Neutro Abs: 1.9 10*3/uL (ref 1.7–7.7)
PLATELETS: 55 10*3/uL — AB (ref 150–400)
RBC: 5.38 MIL/uL (ref 4.22–5.81)
RDW: 13.5 % (ref 11.5–15.5)
WBC: 3.6 10*3/uL — AB (ref 4.0–10.5)

## 2015-03-08 LAB — RAPID URINE DRUG SCREEN, HOSP PERFORMED
Amphetamines: NOT DETECTED
Barbiturates: NOT DETECTED
Benzodiazepines: NOT DETECTED
Cocaine: NOT DETECTED
Opiates: NOT DETECTED
Tetrahydrocannabinol: NOT DETECTED

## 2015-03-08 LAB — BRAIN NATRIURETIC PEPTIDE: B Natriuretic Peptide: 17.5 pg/mL (ref 0.0–100.0)

## 2015-03-08 LAB — TROPONIN I: Troponin I: <0.03 ng/mL (ref <0.031)

## 2015-03-08 LAB — ETHANOL: ALCOHOL ETHYL (B): 429 mg/dL — AB (ref <5)

## 2015-03-08 MED ORDER — OMEPRAZOLE 20 MG PO CPDR: 20.0000 mg | DELAYED_RELEASE_CAPSULE | Freq: Every day | ORAL | Status: DC

## 2015-03-08 MED ORDER — METFORMIN HCL 500 MG PO TABS: 500.0000 mg | ORAL_TABLET | Freq: Two times a day (BID) | ORAL | Status: DC

## 2015-03-08 MED ORDER — POTASSIUM CHLORIDE ER 10 MEQ PO TBCR: 10.0000 meq | EXTENDED_RELEASE_TABLET | Freq: Every day | ORAL | Status: DC

## 2015-03-08 MED ORDER — LACTULOSE 10 GM/15ML PO SOLN: 20.0000 g | Freq: Two times a day (BID) | ORAL | Status: DC

## 2015-03-08 MED ORDER — CARVEDILOL 3.125 MG PO TABS
3.1250 mg | ORAL_TABLET | Freq: Two times a day (BID) | ORAL | Status: DC
Start: 2015-03-08 — End: 2015-10-02

## 2015-03-08 MED ORDER — CHLORDIAZEPOXIDE HCL 25 MG PO CAPS: ORAL_CAPSULE | ORAL | Status: DC

## 2015-03-08 MED ORDER — SPIRONOLACTONE 25 MG PO TABS: 25.0000 mg | ORAL_TABLET | Freq: Every day | ORAL | Status: DC

## 2015-03-08 MED ORDER — OXYCODONE-ACETAMINOPHEN 5-325 MG PO TABS
1.0000 | ORAL_TABLET | Freq: Once | ORAL | Status: AC
  Administered 2015-03-08: 1 via ORAL
  Filled 2015-03-08: qty 1

## 2015-03-08 MED ORDER — FUROSEMIDE 40 MG PO TABS: 40.0000 mg | ORAL_TABLET | Freq: Every day | ORAL | Status: DC

## 2015-03-08 NOTE — ED Notes (Signed)
Pt requesting pain medication and update - Dr. Blinda LeatherwoodPollina aware and will update family.

## 2015-03-08 NOTE — ED Provider Notes (Signed)
CSN: [id number]     Arrival date & time 03/07/15  2259 History  By signing my name below, I, Sonum Patel, attest that this documentation has been prepared under the direction and in the presence of Gilda Creasehristopher J Pollina, MD. Electronically Signed: Sonum Patel, Neurosurgeoncribe. 03/08/2015. 1:56 AM.    Chief Complaint  Patient presents with  . Alcohol Problem   The history is provided by the patient and a relative. No language interpreter was used.     HPI Comments: [name] [name] is a 49 y.o. male with past medical history of DM, cirrhosis who presents to the Emergency Department complaining of alcohol intoxication and requesting detox today. Patient also complains of CP and left upper extremity pain at this time, states he has had this daily for the last 1 month. He states the CP is intermittent and occurs 2-3 times per day and lasts for about 10-20 minutes at a time. He also complains of frequent HA's and currently has neck pain. Per family, patient has been complaining of dizziness and vision problems. Family states patient has been seen in the ED a couple of times for alcohol related complaints but has not yet sought treatment.     Past Medical History  Diagnosis Date  . Shortness of breath   . Sleep apnea   . Diabetes mellitus without complication (HCC)   . GERD (gastroesophageal reflux disease)   . H/O hiatal hernia   . Seizures (HCC)   . Headache(784.0)   . No pertinent past medical history   . Cirrhosis (HCC)   . Alcohol abuse    Past Surgical History  Procedure Laterality Date  . Appendectomy    . No past surgeries    . Radiology with anesthesia  01/30/2012    Procedure: RADIOLOGY WITH ANESTHESIA;  Surgeon: Casimiro NeedleMichael T. Miles CostainShick, MD;  Location: [facility] OR;  Service: Radiology;  Laterality: N/A;   No family history on file. Social History  Substance Use Topics  . Smoking status: Former Smoker -- 0.25 packs/day for 10 years    Types: Cigarettes    Quit date: 09/30/2007  . Smokeless  tobacco: Never Used  . Alcohol Use: Yes     Comment: heavily x 7 days    Review of Systems  Eyes: Positive for visual disturbance.  Cardiovascular: Positive for chest pain.  Musculoskeletal: Positive for neck pain.  Neurological: Positive for dizziness and headaches.  All other systems reviewed and are negative.     Allergies  Review of patient's allergies indicates no known allergies.  Home Medications   Prior to Admission medications   Medication Sig Start Date End Date Taking? Authorizing Provider  carvedilol (COREG) 3.125 MG tablet Take 1 tablet (3.125 mg total) by mouth 2 (two) times daily with a meal. 03/08/15   Gilda Creasehristopher J Pollina, MD  chlordiazePOXIDE (LIBRIUM) 25 MG capsule 50mg  PO TID x 1D, then 25-50mg  PO BID X 1D, then 25-50mg  PO QD X 1D 03/08/15   Gilda Creasehristopher J Pollina, MD  furosemide (LASIX) 40 MG tablet Take 1 tablet (40 mg total) by mouth daily. 03/08/15   Gilda Creasehristopher J Pollina, MD  lactulose (CHRONULAC) 10 GM/15ML solution Take 30 mLs (20 g total) by mouth 2 (two) times daily. 03/08/15   Gilda Creasehristopher J Pollina, MD  metFORMIN (GLUCOPHAGE) 500 MG tablet Take 1 tablet (500 mg total) by mouth 2 (two) times daily with a meal. 03/08/15   Gilda Creasehristopher J Pollina, MD  omeprazole (PRILOSEC) 20 MG capsule Take 1 capsule (20 mg total) by  mouth daily. 03/08/15   Gilda Crease, MD  ondansetron (ZOFRAN) 4 MG tablet Take 1 tablet (4 mg total) by mouth every 6 (six) hours. Patient not taking: Reported on 03/08/2015 12/04/14   Elpidio Anis, PA-C  potassium chloride (K-DUR) 10 MEQ tablet Take 1 tablet (10 mEq total) by mouth daily. 03/08/15   Gilda Crease, MD  spironolactone (ALDACTONE) 25 MG tablet Take 1 tablet (25 mg total) by mouth daily. 03/08/15   Gilda Crease, MD   BP 114/52 mmHg  Pulse 86  Temp(Src) 97.9 F (36.6 C) (Oral)  Resp 18  SpO2 89% Physical Exam  Constitutional: He is oriented to person, place, and time. He appears well-developed and  well-nourished. No distress.  HENT:  Head: Normocephalic and atraumatic.  Right Ear: Hearing normal.  Left Ear: Hearing normal.  Nose: Nose normal.  Mouth/Throat: Oropharynx is clear and moist and mucous membranes are normal.  Eyes: Conjunctivae and EOM are normal. Pupils are equal, round, and reactive to light.  Neck: Normal range of motion. Neck supple.  Cardiovascular: Normal rate, regular rhythm, S1 normal and S2 normal.  Exam reveals no gallop and no friction rub.   No murmur heard. Pulmonary/Chest: Effort normal and breath sounds normal. No respiratory distress. He exhibits no tenderness.  Abdominal: Soft. Normal appearance and bowel sounds are normal. There is no hepatosplenomegaly. There is no tenderness. There is no rebound, no guarding, no tenderness at McBurney's point and negative Murphy's sign. No hernia.  Musculoskeletal: Normal range of motion. He exhibits edema (pitting edema to the bilateral lower extremities ).  Neurological: He is alert and oriented to person, place, and time. He has normal strength. No cranial nerve deficit or sensory deficit. Coordination normal. GCS eye subscore is 4. GCS verbal subscore is 5. GCS motor subscore is 6.  Skin: Skin is warm, dry and intact. No rash noted. No cyanosis.  Psychiatric: He has a normal mood and affect. His speech is normal and behavior is normal. Thought content normal.  Nursing note and vitals reviewed.   ED Course  Procedures (including critical care time)  DIAGNOSTIC STUDIES: Oxygen Saturation is 91% on RA, low by my interpretation.    COORDINATION OF CARE: 2:05 AM Discussed treatment plan with pt at bedside and pt agreed to plan.   Labs Review Labs Reviewed  ETHANOL - Abnormal; Notable for the following:    Alcohol, Ethyl (B) 429 (*)    All other components within normal limits  CBC WITH DIFFERENTIAL/PLATELET - Abnormal; Notable for the following:    WBC 3.6 (*)    Hemoglobin 18.5 (*)    HCT 53.2 (*)    MCH  34.4 (*)    Platelets 55 (*)    All other components within normal limits  COMPREHENSIVE METABOLIC PANEL - Abnormal; Notable for the following:    Glucose, Bld 175 (*)    BUN <5 (*)    Creatinine, Ser 0.43 (*)    Albumin 3.4 (*)    AST 165 (*)    ALT 87 (*)    Total Bilirubin 2.8 (*)    All other components within normal limits  URINE RAPID DRUG SCREEN, HOSP PERFORMED  TROPONIN I  BRAIN NATRIURETIC PEPTIDE    Imaging Review Dg Chest 2 View  03/08/2015  CLINICAL DATA:  Acute onset of generalized chest pain. Chronic lower extremity edema. Initial encounter. EXAM: CHEST  2 VIEW COMPARISON:  Chest radiograph performed 12/15/2014 FINDINGS: The lungs are well-aerated. Mild bibasilar atelectasis is  noted. Mild vascular congestion is seen. There is no evidence of pleural effusion or pneumothorax. The heart is borderline normal in size. No acute osseous abnormalities are seen. Postoperative change is noted at the upper abdomen. IMPRESSION: Mild bibasilar atelectasis noted.  Mild vascular congestion seen. Electronically Signed   By: Roanna Raider M.D.   On: 03/08/2015 02:36   I have personally reviewed and evaluated these images and lab results as part of my medical decision-making.   EKG Interpretation None       ED ECG REPORT   Date: 03/08/2015  Rate: 82  Rhythm: normal sinus rhythm  QRS Axis: left  Intervals: normal  ST/T Wave abnormalities: normal  Conduction Disutrbances:nonspecific intraventricular conduction delay  Narrative Interpretation:   Old EKG Reviewed: unchanged  I have personally reviewed the EKG tracing and agree with the computerized printout as noted.   MDM   Final diagnoses:  Alcoholic cirrhosis of liver without ascites (HCC)  Peripheral edema  Alcohol abuse  Chest pain, unspecified chest pain type    Patient presents to the ER with numerous complaints. Patient initially presented reporting that he needed help of alcoholism. He does have a long history  of alcohol abuse. Patient is significantly intoxicated at arrival. Blood alcohol was 429. I discussed with him and his family that we do not have detox services, will provide him with resources for outpatient management.  Patient complaining of pain and swelling of his feet. He does have significant edema of both lower extremities. He has cirrhosis and swelling is likely secondary to third spacing. He does not have any evidence of congestive heart failure at this time. Will initiate Lasix treatment.  Patient reports that he has been expressing chest pain. This has been a daily occurrence for him for months. He does not have any changes on his EKG. Troponin is negative. Patient is appropriate for continued outpatient workup of this chronically recurring problem.  Prescription for all of his chronic meds were provided. Patient was given a Librium taper. He was given outpatient resources for alcohol abuse.  I personally performed the services described in this documentation, which was scribed in my presence. The recorded information has been reviewed and is accurate.    Gilda Crease, MD 03/08/15 [phone]

## 2015-03-08 NOTE — ED Notes (Signed)
Discussed d/c papers w/ pt family member - pt family member verbalized understanding of d/c instructions, prescriptions, detox resources, and follow-up care. No further questions/concerns, VSS, assisted to lobby in wheelchair.

## 2015-03-08 NOTE — Discharge Instructions (Signed)
Enfermedad heptica por alcoholismo (Alcoholic Liver Disease) El hgado es un rgano que transforma los alimentos en [place], absorbe las vitaminas de los alimentos, elimina las toxinas de la sangre y Armed forces operational officer protenas. La enfermedad heptica por alcoholismo ocurre cuando el hgado se daa debido al consumo de alcohol y deja de funcionar correctamente. CAUSAS La causa de esta enfermedad es el consumo excesivo de alcohol durante varios aos. FACTORES DE RIESGO Es ms probable que esta afeccin se manifieste en:  Mujeres.  Las personas con antecedentes familiares de la enfermedad.  Las personas cuya alimentacin es deficiente.  Las [employer]. SNTOMAS Los primeros sntomas de esta enfermedad incluyen lo siguiente:  Alma Friendly.  Debilidad.  Molestias abdominales en la regin superior derecha. Los sntomas de enfermedad moderada incluyen lo siguiente:  Grant Ruts.  Piel amarillenta, plida u oscura.  Dolor abdominal.  Nuseas y vmitos.  Prdida de peso.  Prdida del apetito. Los sntomas de enfermedad avanzada incluyen lo siguiente:  Hinchazn abdominal.  Hemorragias nasales o encas sangrantes.  Picazn en la piel.  Aumento del tamao de las puntas de los dedos [place]).  Heces de color claro y con olor ftido (esteatorrea).  Cambios en el humor.  Sensacin de Designer, jewellery.  Confusin.  Dificultad para concentrarse. Algunas personas no tienen sntomas hasta que la enfermedad es grave. A menudo, los sntomas se intensifican despus de tomar en exceso. DIAGNSTICO Esta enfermedad se diagnostica mediante:  Un examen fsico.  Anlisis de sangre.  Extraccin de Lauris Poag de tejido heptico para examinarla con un microscopio (biopsia de hgado). Tambin pueden hacerle otros estudios, por ejemplo:   Radiografas.  Ecografa. TRATAMIENTO El tratamiento puede incluir lo siguiente:  Suspender el consumo de alcohol para permitir que el hgado se  recupere.  Unirse a un grupo de apoyo o Pharmacologist reuniones con un consejero.  Medicamentos para reducir Futures trader. Tal vez se recomienden si la enfermedad es grave.  Un trasplante de hgado. Este es el nico tratamiento si la enfermedad es muy grave.  Terapia nutricional. Esto puede implicar lo siguiente:  Tomar vitaminas.  Consumir alimentos con alto contenido de tiamina, como cereales integrales, cerdo y vegetales crudos.  Consumir alimentos con alto contenido de cido flico, como verduras, frutas, carnes, frijoles, frutos secos y productos lcteos.  Consumir una dieta que incluya alimentos con alto contenido de carbohidratos, como yogur, frijoles, patatas y Surveyor, minerals. INSTRUCCIONES PARA EL CUIDADO EN EL HOGAR  No beba alcohol.  Tome los medicamentos solamente como se lo haya indicado el mdico.  Tome las vitaminas solamente como se lo haya indicado el mdico.  Siga las indicaciones del mdico en lo que respecta a la dieta. SOLICITE ATENCIN MDICA SI:  Lance Muss.  Le falta el aire o tiene dificultad para respirar.  Observa sangre de color rojo brillante en la materia fecal, o las heces son negras y de Geophysicist/field seismologist.  Vomita sangre.  La piel se le torna de un color ms amarillento, plida u oscura.  Comienza a sentir dolores de [place].  Tiene dificultad para pensar.  Tiene problemas de equilibrio o para caminar.   Esta informacin no tiene Theme park manager el consejo del mdico. Asegrese de hacerle al mdico cualquier pregunta que tenga.   Document Released: 07/09/2014 Elsevier Interactive Patient Education 2016 Elsevier Inc.  Edema (Edema) Un edema es una acumulacin anormal de lquidos. Es ms frecuente en las piernas y los muslos. La hinchazn indolora de pies y tobillos es ms probable a medida que una persona envejece. Tambin es  comn en la piel ms floja, como alrededor The Mutual of Omaha. CUIDADOS EN EL HOGAR   Mantenga la parte afectada del  cuerpo por encima del nivel del corazn mientras est recostado.  No se quede quieto ni permanezca de pie durante [employer].  No coloque nada exactamente debajo de las rodillas al recostarse.  No use ropa ajustada en los muslos.  Ejercite las piernas para ayudar a que la inflamacin (hinchazn) disminuya.  Use vendajes elsticos o medias de compresin segn las indicaciones del mdico.  Una dieta con bajo contenido de sal puede ayudar a disminuir la hinchazn.  Solo tome los Chesapeake Energy le haya indicado su mdico. SOLICITE AYUDA SI:  El tratamiento no funciona.  Tiene una enfermedad cardaca, heptica o renal, y nota que la piel parece hinchada o tiene aspecto brillante.  Tiene hinchazn en las piernas que no mejora cuando las eleva.  Ha aumentado sbitamente de peso sin ningn motivo. SOLICITE AYUDA DE INMEDIATO SI:   Tiene dificultad para respirar o le duele el pecho.  No puede respirar cuando se acuesta.  Las reas hinchadas presentan dolor, enrojecimiento o Airline pilot.  Tiene una enfermedad cardaca, heptica o renal, y le aparece un edema de repente.  Tiene fiebre y los sntomas empeoran de manera sbita. ASEGRESE DE QUE:   Comprende estas instrucciones.  Controlar su afeccin.  Recibir ayuda de inmediato si no mejora o si empeora.   Esta informacin no tiene Theme park manager el consejo del mdico. Asegrese de hacerle al mdico cualquier pregunta que tenga.   Document Released: 12/13/2012 Document Revised: 02/27/2013 Elsevier Interactive Patient Education 2016 ArvinMeritor.  Dolor de pecho inespecfico  (Nonspecific Chest Pain) El dolor de pecho puede deberse a muchas enfermedades diferentes. Siempre existe una posibilidad de que el dolor est relacionado con algo grave, como un infarto de miocardio o un cogulo sanguneo en los pulmones. Hay muchas enfermedades que no son potencialmente mortales que pueden causar dolor de [place]. Si tiene Engineer, mining de  Hotel manager, es muy importante que se controle con el mdico. CAUSAS  Las causas del dolor de pecho pueden ser las siguientes:  Acidez estomacal.  Neumona o bronquitis.  Ansiedad o estrs.  Inflamacin de la zona que rodea al corazn (pericarditis) o a los pulmones (pleuritis o pleuresa).  Un cogulo sanguneo en el pulmn.  Colapso de un pulmn (neumotrax), que puede aparecer de [employer] Corporation repentina por s solo (neumotrax espontneo) o debido a un traumatismo en el trax.  Culebrilla (virus de la varicela zster).  Infarto de miocardio.  Dao de los Grace, los msculos y los cartlagos que conforman la pared torcica. Esto puede incluir lo siguiente:  Hematomas seos debido a lesiones.  Distensiones musculares o de los cartlagos por tos frecuente o repetida, o por exceso de trabajo.  Fractura de una o ms costillas.  Dolor de TEFL teacher debido a inflamacin (costocondritis). FACTORES DE RIESGO  Los factores de riesgo de tener dolor de pecho pueden incluir lo siguiente:  Actividades que incrementan el riesgo de sufrir traumatismos o lesiones en el trax.  Infecciones o enfermedades respiratorias que causan tos frecuente.  Enfermedades o [employer]ak comidas que pueden causar Engineering geologist.  Enfermedades cardacas o antecedentes familiares de enfermedades cardacas.  Enfermedades o comportamientos de salud que aumentan el riesgo de tener un cogulo sanguneo.  Haber tenido varicela (varicela zster). SIGNOS Y SNTOMAS El dolor de pecho puede provocar las siguientes sensaciones:  Ardor u hormigueo en la superficie o en lo profundo del pecho.  Dolor opresivo, continuo o constrictivo.  Dolor vago o intenso que empeora al [employer] Company, toser o inhalar profundamente.  Dolor que tambin se siente en la espalda, el cuello, el hombro o el brazo, o dolor que se irradia a cualquiera de estas zonas. El dolor de pecho puede aparecer y Geneticist, molecular, o bien puede ser  constante. DIAGNSTICO Gretchen Short se necesiten anlisis de laboratorio u otros estudios para Veterinary surgeon causa del Engineer, mining. El mdico puede indicarle que se haga una prueba llamada EGC (electrocadiograma) ambulatorio. El Regulatory affairs officer los patrones de los latidos cardacos en el momento en que se realiza el [place]. Tambin pueden hacerle otros estudios, por ejemplo:  Ecocardiograma transtorcico (ETT). Durante el ecocardiograma, se usan ondas sonoras para crear una imagen de todas las estructuras cardacas y evaluar cmo circula la sangre por el corazn.  Ecocardiograma transesofgico (ETE).Este es un estudio de diagnstico por imgenes ms avanzado que el obtiene imgenes del interior del cuerpo. Le permite al mdico ver el corazn con mayor detalle.  Monitoreo cardaco. Permite que el mdico controle la frecuencia y el ritmo cardaco en tiempo real.  Monitor Holter. Es un dispositivo porttil que eBay latidos del corazn y puede ayudar a Education administrator las arritmias cardacas. Le permite al American Express registrar la actividad cardaca durante varios das, si es necesario.  Pruebas de esfuerzo. Estas pueden realizarse durante el ejercicio o mediante la administracin de un medicamento que acelera los latidos del corazn.  Anlisis de [place].  Diagnstico por imgenes. TRATAMIENTO  El tratamiento depende de la causa del dolor de [place]. El tratamiento puede incluir lo siguiente:  Medicamentos. Estos pueden incluir lo siguiente:  Inhibidores de Publishing copy.  Antiinflamatorios.  Analgsicos para las enfermedades inflamatorias.  Antibiticos, si hay una infeccin.  Medicamentos para Northwest Airlines.  Medicamentos para tratar la enfermedad arterial coronaria.  Tratamiento complementario para las enfermedades que no requieren la toma de medicamentos. Esto puede incluir lo siguiente:  Descansar.  Aplicar compresas fras o calientes en las zonas  lesionadas.  Limitar las actividades hasta que Erie Insurance Group. INSTRUCCIONES PARA EL CUIDADO EN EL HOGAR  Si le recetaron antibiticos, asegrese de terminarlos, incluso si comienza a sentirse mejor.  Evite las SUPERVALU INC causen dolor de [place].  No consuma ningn producto que contenga tabaco, lo que incluye cigarrillos, tabaco de Theatre manager o Administrator, Civil Service. Si necesita ayuda para dejar de fumar, consulte al mdico.  No beba alcohol.  Tome los medicamentos solamente como se lo haya indicado el mdico.  Concurra a todas las visitas de control como se lo haya indicado el mdico. Esto es importante. Esto incluye otros estudios si el dolor de pecho no desaparece.  Si la acidez es la causa del dolor de [place], tal vez le aconsejen que mantenga la cabeza levantada (elevada) mientras duerme. Esto reduce la probabilidad de que el cido retroceda del estmago al esfago.  Haga cambios en su estilo de vida como se lo haya indicado el mdico. Estos pueden incluir lo siguiente:  Education administrator actividad fsica con regularidad. Pida al mdico que le sugiera algunas actividades que sean seguras para usted.  Consumir una dieta cardiosaludable. Un nutricionista matriculado puede ayudarlo a Software engineer saludables.  Mantener un peso saludable.  Controlar la diabetes, si es necesario.  Reducir las situaciones de estrs. SOLICITE ATENCIN MDICA SI:  El dolor de pecho no desaparece despus del tratamiento.  Tiene una erupcin cutnea con ampollas en el pecho.  Tiene fiebre. SOLICITE ATENCIN MDICA DE INMEDIATO SI:  El dolor en el pecho es ms intenso.  La tos empeora, o expectora sangre.  Siente un dolor abdominal intenso.  Siente debilidad intensa.  Se desmaya.  Tiene escalofros.  Tiene una molestia repentina e inexplicable en el pecho.  Tiene molestias repentinas e Exxon Mobil Corporationinexplicables en los brazos, la espalda, el cuello o la Savage Townmandbula.  Le falta el aire en  cualquier momento.  Comienza a sudar de Hondurasmanera repentina o la piel se le humedece.  Siente nuseas o vomita.  Se siente repentinamente mareado o se desmaya.  Siente que el corazn comienza a latir rpidamente o que se saltea latidos. Estos sntomas pueden representar un problema grave que constituye Radio broadcast assistantuna emergencia. No espere hasta que los sntomas desaparezcan. Solicite atencin mdica de inmediato. Comunquese con el servicio de emergencias de su localidad (911 en los [place]). No conduzca por sus propios medios OfficeMax Incorporatedhasta el hospital.   Esta informacin no tiene Theme park managercomo fin reemplazar el consejo del mdico. Asegrese de hacerle al mdico cualquier pregunta que tenga.   Document Released: 02/22/2005 Document Revised: 03/15/2014 Elsevier Interactive Patient Education 2016 ArvinMeritorElsevier Inc. Substance Abuse Treatment Programs  Intensive Outpatient Programs [facility]     [address]      [place], [place]                   [phone]       [facility] [address] [place], [place] [phone]  [facility]     (Inpatient and outpatient)     [address]           [phone]    [facility] [phone] (Suboxone and Methadone)  [address]      [place], [place] [address]      [phone]       [address] [place], [place] [phone]  [facility] (Outpatient/Inpatient, Chemical)    (insurance only) [phone]             [facility] (Groups & Residential) [place], [place] [phone]     [facility]     [address]     [place], [place]      [phone]       [facility] (for caregivers and family) [phone] [address] [place], [place] [phone]      Residential Treatment Programs [facility]      [address], [place], [place] [address]  [phone]       [facility] [address] [place], [place]  [id number] [phone]  [facility]        [phone]       [facility] [phone]  [facility]             [address]                                         [place], [place]                                                [phone] or [phone]  [facility] [address] [place], [address] [phone]  [facility]    [address]      [place], [place]     [phone]       [facility] [address] [place], [place] [phone]  [facility]   [address] [place], [place] [address]     [phone]      Admissions: 8am-3pm M-F  [facility] ([facility]) [address] [place], [place] [phone]  [facility] Program: Residential Program [phone] Days)   [place], [place]      [phone] or [phone]     ADATC: [facility] [place], [place] (Walk in Hours over the weekend or by referral)  [facility] [address] [place], [place], [place] [address] [phone]  Crisis Mobile: [facility]:  [phone] (for crisis response 24 hours a day) [facility] Hotline:      [phone] Outpatient Psychiatry and Counseling  [facility]: Mobile Crisis Management 24 hours:  [phone]  [facility] of the [employer] sliding scale fee and walk in schedule: M-F 8am-12pm/1pm-3pm [address]  [place], [place] [address] [phone]  [facility] [address] [place], [place] [address] [phone]  [facility] ([facility])- new patient walk-in appointments available Monday - Friday 8am -3pm.          [address] [place], [place] [address] [phone] or crisis line- [phone]  [facility]/ Intensive Outpatient Therapy Program [address] [place], [place] [address] [phone]  [facility]                  Crisis Services      [phone] [address]     [place], [place] [address]                 [facility]   [facility] [phone]. [address] [place], [place] [address]   [facility] of Care          [address]  [place], [place] [address]       [phone]  [facility] [address] [place], [place] [address] [phone]  [facility]    [address]    [place], [place] [address]     [phone]       Andee Poles, MD     [address]     [place] [address]     [phone]       [facility] [address] [place] [address]  [facility]     [address] [place], [place]      [phone]       [facility] Eulogio Ditch, MD [address] [place], [place] [address] [phone]  [facility]     [address]     [place], [place] [address]     [phone]       [facility] [address] [place], [place] [address] [phone] Resources for Temporary Residential Assistance/Crisis Centers  DAY CENTERS [facility]) M-F 8am-3pm   [address] [place], [place] [address]   [phone] Services include: laundry, barbering, support groups, case management, phone  &  computer access, showers, AA/NA mtgs, mental health/substance abuse nurse, job skills class, disability information, VA assistance, spiritual classes, etc.   HOMELESS SHELTERS  [facility]     [facility]   [address] [place]     [phone]              [address] (women and children)       [address] [place], [place] [address] [phone] [email or web address] for application and  process Application Required  [facility]   [address]    [place] [address]     [phone]                    [facility] [place] [address] [place], [place] [address] [phone] application appt.) Application Required  [facility] (women only)    [address]     [place], [place] [address]     [phone]      Intake starts 6pm daily Need valid ID, SSC, & Police report [employer] [address] [place], [place] [phone] Application Required  [facility] (men only)     [address]      [place], [place]     [phone]       [facility] (Pregnant women only) [address] [place], [place] [phone]  [facility]      [address]      [place], [place] [address]     [phone]             [facility] [address] [place], [place] [phone] 90 day commitment/SA/Application process  [facility](men only)     [address]     [place], [place]     [phone]       Check-in at [facility] of [facility] [address] [place], [place] [address] [phone] Men/Women/Women and Children must be there by 7 pm  [facility] [place], [place] [phone]

## 2015-03-08 NOTE — ED Notes (Signed)
Blood alcohol over 400

## 2015-09-29 ENCOUNTER — Emergency Department (HOSPITAL_COMMUNITY): Payer: Self-pay

## 2015-09-29 ENCOUNTER — Inpatient Hospital Stay (HOSPITAL_COMMUNITY)
Admission: EM | Admit: 2015-09-29 | Discharge: 2015-10-02 | DRG: 896 | Disposition: A | Discharge status: Home / Self Care | Payer: Self-pay | Attending: Family Medicine | Admitting: Family Medicine

## 2015-09-29 ENCOUNTER — Encounter (HOSPITAL_COMMUNITY): Payer: Self-pay | Admitting: Emergency Medicine

## 2015-09-29 DIAGNOSIS — R0602 Shortness of breath: Secondary | ICD-10-CM

## 2015-09-29 DIAGNOSIS — F10920 Alcohol use, unspecified with intoxication, uncomplicated: Secondary | ICD-10-CM

## 2015-09-29 DIAGNOSIS — K219 Gastro-esophageal reflux disease without esophagitis: Secondary | ICD-10-CM | POA: Diagnosis present

## 2015-09-29 DIAGNOSIS — I8510 Secondary esophageal varices without bleeding: Secondary | ICD-10-CM | POA: Diagnosis present

## 2015-09-29 DIAGNOSIS — W19XXXA Unspecified fall, initial encounter: Secondary | ICD-10-CM

## 2015-09-29 DIAGNOSIS — J9601 Acute respiratory failure with hypoxia: Secondary | ICD-10-CM | POA: Diagnosis present

## 2015-09-29 DIAGNOSIS — T424X5A Adverse effect of benzodiazepines, initial encounter: Secondary | ICD-10-CM | POA: Diagnosis present

## 2015-09-29 DIAGNOSIS — D6959 Other secondary thrombocytopenia: Secondary | ICD-10-CM | POA: Diagnosis present

## 2015-09-29 DIAGNOSIS — I493 Ventricular premature depolarization: Secondary | ICD-10-CM | POA: Diagnosis present

## 2015-09-29 DIAGNOSIS — Z7984 Long term (current) use of oral hypoglycemic drugs: Secondary | ICD-10-CM

## 2015-09-29 DIAGNOSIS — R442 Other hallucinations: Secondary | ICD-10-CM | POA: Diagnosis present

## 2015-09-29 DIAGNOSIS — F329 Major depressive disorder, single episode, unspecified: Secondary | ICD-10-CM | POA: Diagnosis present

## 2015-09-29 DIAGNOSIS — R61 Generalized hyperhidrosis: Secondary | ICD-10-CM | POA: Diagnosis present

## 2015-09-29 DIAGNOSIS — I10 Essential (primary) hypertension: Secondary | ICD-10-CM | POA: Diagnosis present

## 2015-09-29 DIAGNOSIS — I5032 Chronic diastolic (congestive) heart failure: Secondary | ICD-10-CM | POA: Diagnosis present

## 2015-09-29 DIAGNOSIS — K7030 Alcoholic cirrhosis of liver without ascites: Secondary | ICD-10-CM

## 2015-09-29 DIAGNOSIS — D61818 Other pancytopenia: Secondary | ICD-10-CM | POA: Diagnosis present

## 2015-09-29 DIAGNOSIS — I110 Hypertensive heart disease with heart failure: Secondary | ICD-10-CM | POA: Diagnosis present

## 2015-09-29 DIAGNOSIS — F10239 Alcohol dependence with withdrawal, unspecified: Principal | ICD-10-CM | POA: Diagnosis present

## 2015-09-29 DIAGNOSIS — G4700 Insomnia, unspecified: Secondary | ICD-10-CM | POA: Diagnosis present

## 2015-09-29 DIAGNOSIS — R0902 Hypoxemia: Secondary | ICD-10-CM

## 2015-09-29 DIAGNOSIS — Y908 Blood alcohol level of 240 mg/100 ml or more: Secondary | ICD-10-CM | POA: Diagnosis present

## 2015-09-29 DIAGNOSIS — G4089 Other seizures: Secondary | ICD-10-CM | POA: Diagnosis present

## 2015-09-29 DIAGNOSIS — R0789 Other chest pain: Secondary | ICD-10-CM

## 2015-09-29 DIAGNOSIS — R296 Repeated falls: Secondary | ICD-10-CM | POA: Diagnosis present

## 2015-09-29 DIAGNOSIS — R079 Chest pain, unspecified: Secondary | ICD-10-CM

## 2015-09-29 DIAGNOSIS — Z79899 Other long term (current) drug therapy: Secondary | ICD-10-CM

## 2015-09-29 DIAGNOSIS — F1010 Alcohol abuse, uncomplicated: Secondary | ICD-10-CM | POA: Diagnosis present

## 2015-09-29 DIAGNOSIS — F17210 Nicotine dependence, cigarettes, uncomplicated: Secondary | ICD-10-CM | POA: Diagnosis present

## 2015-09-29 DIAGNOSIS — E119 Type 2 diabetes mellitus without complications: Secondary | ICD-10-CM | POA: Diagnosis present

## 2015-09-29 DIAGNOSIS — F10929 Alcohol use, unspecified with intoxication, unspecified: Secondary | ICD-10-CM

## 2015-09-29 DIAGNOSIS — G4730 Sleep apnea, unspecified: Secondary | ICD-10-CM | POA: Diagnosis present

## 2015-09-29 DIAGNOSIS — Z6831 Body mass index (BMI) 31.0-31.9, adult: Secondary | ICD-10-CM

## 2015-09-29 DIAGNOSIS — K920 Hematemesis: Secondary | ICD-10-CM | POA: Diagnosis present

## 2015-09-29 LAB — BRAIN NATRIURETIC PEPTIDE: B NATRIURETIC PEPTIDE 5: 18.4 pg/mL (ref 0.0–100.0)

## 2015-09-29 LAB — COMPREHENSIVE METABOLIC PANEL
ALK PHOS: 74 U/L (ref 38–126)
ALT: 86 U/L — ABNORMAL HIGH (ref 17–63)
ANION GAP: 10 (ref 5–15)
AST: 184 U/L — ABNORMAL HIGH (ref 15–41)
Albumin: 3.5 g/dL (ref 3.5–5.0)
BILIRUBIN TOTAL: 3.3 mg/dL — AB (ref 0.3–1.2)
BUN: <5 mg/dL — ABNORMAL LOW (ref 6–20)
CALCIUM: 8.3 mg/dL — AB (ref 8.9–10.3)
CO2: 24 mmol/L (ref 22–32)
Chloride: 108 mmol/L (ref 101–111)
Creatinine, Ser: 0.55 mg/dL — ABNORMAL LOW (ref 0.61–1.24)
GFR calc non Af Amer: >60 mL/min (ref >60)
Glucose, Bld: 118 mg/dL — ABNORMAL HIGH (ref 65–99)
Potassium: 3.8 mmol/L (ref 3.5–5.1)
SODIUM: 142 mmol/L (ref 135–145)
TOTAL PROTEIN: 6.7 g/dL (ref 6.5–8.1)

## 2015-09-29 LAB — CBC
HCT: 47.1 % (ref 39.0–52.0)
Hemoglobin: 16 g/dL (ref 13.0–17.0)
MCH: 34 pg (ref 26.0–34.0)
MCHC: 34 g/dL (ref 30.0–36.0)
MCV: 100.2 fL — ABNORMAL HIGH (ref 78.0–100.0)
PLATELETS: 65 10*3/uL — AB (ref 150–400)
RBC: 4.7 MIL/uL (ref 4.22–5.81)
RDW: 13.1 % (ref 11.5–15.5)
WBC: 4.6 10*3/uL (ref 4.0–10.5)

## 2015-09-29 LAB — ETHANOL: Alcohol, Ethyl (B): 411 mg/dL (ref <5)

## 2015-09-29 NOTE — ED Notes (Signed)
Pt placed on NRB per EDP order.

## 2015-09-29 NOTE — ED Notes (Signed)
Placed pt on 6L 

## 2015-09-29 NOTE — ED Provider Notes (Signed)
[facility]-EMERGENCY DEPT Provider Note   CSN: [id number] Arrival date & time: 09/29/15  2149  First Provider Contact:  None       History   Chief Complaint Chief Complaint  Patient presents with  . Chest Pain  . Leg Pain  . Alcohol Intoxication  . Suicidal  . Fall    HPI [name] [name] is a 50 y.o. male.  HPI  Level V caveat secondary to alcohol intoxication  50 year old male with past medical history of alcohol abuse, liver cirrhosis, diabetes, acid reflux presenting with alcohol intoxication. Patient states he drank a 40 ounce beer prior to arrival. He then fell 3 times. He is unsure if he hit his head or lost consciousness. He is also complaining of "heart pain". He is unable to say how long this has been going on. He also notes that his bilateral lower extremities have been swelling and becoming more painful. He is also unable to tell how long this has been going on. He endorses associated shortness of breath. He also notes that his back hurts from the falls earlier today. Patient is unable to provide further useful history.  Chart review: Patient admitted in April 2017 for alcohol abuse and chest pain. Patient had an abnormal stress test while in the hospital but was unable to undergo cardiac catheterization due to thrombocytopenia likely related to his alcohol use. He has been in the emergency department and hospitalized multiple times for alcohol-related events.    Past Medical History:  Diagnosis Date  . Alcohol abuse   . Cirrhosis (HCC)   . Diabetes mellitus without complication (HCC)   . GERD (gastroesophageal reflux disease)   . H/O hiatal hernia   . Headache(784.0)   . No pertinent past medical history   . Seizures (HCC)   . Shortness of breath   . Sleep apnea     Patient Active Problem List   Diagnosis Date Noted  . Acute diastolic CHF (congestive heart failure) (HCC) 11/06/2014  . History of cirrhosis of liver   . Pulmonary vascular congestion 11/05/2014    . Volume overload 11/05/2014  . Diastolic dysfunction with acute on chronic heart failure (HCC) 11/05/2014  . Abdominal pain, chronic, epigastric 11/05/2014  . Diabetes (HCC) 07/24/2013  . HTN (hypertension) 07/24/2013  . Left arm weakness 06/12/2013  . S/P cholecystectomy 06/12/2013  . Splenomegaly 06/12/2013  . Encephalopathy 06/11/2013  . Liver cirrhosis, alcoholic (HCC) 11/02/2012  . DM (diabetes mellitus) type 2, uncontrolled, with ketoacidosis (HCC) 11/02/2012  . Pain in joint, lower leg 11/02/2012  . Syncopal episodes 10/21/2012  . Abdominal pain 10/21/2012  . Alcohol abuse 01/29/2012  . Alcoholic cirrhosis (HCC) 01/29/2012  . Thrombocytopenia, secondary 01/29/2012  . DM type 2 (diabetes mellitus, type 2) (HCC) 01/29/2012  . Esophageal varices in alcoholic cirrhosis (HCC) 01/29/2012  . History of GI bleed 01/29/2012    Past Surgical History:  Procedure Laterality Date  . APPENDECTOMY    . NO PAST SURGERIES    . RADIOLOGY WITH ANESTHESIA  01/30/2012   Procedure: RADIOLOGY WITH ANESTHESIA;  Surgeon: Casimiro Needle T. Miles Costain, MD;  Location: [facility] OR;  Service: Radiology;  Laterality: N/A;       Home Medications    Prior to Admission medications   Medication Sig Start Date End Date Taking? Authorizing Provider  carvedilol (COREG) 3.125 MG tablet Take 1 tablet (3.125 mg total) by mouth 2 (two) times daily with a meal. 03/08/15   Gilda Crease, MD  chlordiazePOXIDE (LIBRIUM) 25 MG capsule  50mg  PO TID x 1D, then 25-50mg  PO BID X 1D, then 25-50mg  PO QD X 1D 03/08/15   Gilda Crease, MD  furosemide (LASIX) 40 MG tablet Take 1 tablet (40 mg total) by mouth daily. 03/08/15   Gilda Crease, MD  lactulose (CHRONULAC) 10 GM/15ML solution Take 30 mLs (20 g total) by mouth 2 (two) times daily. 03/08/15   Gilda Crease, MD  metFORMIN (GLUCOPHAGE) 500 MG tablet Take 1 tablet (500 mg total) by mouth 2 (two) times daily with a meal. 03/08/15   Gilda Crease,  MD  omeprazole (PRILOSEC) 20 MG capsule Take 1 capsule (20 mg total) by mouth daily. 03/08/15   Gilda Crease, MD  ondansetron (ZOFRAN) 4 MG tablet Take 1 tablet (4 mg total) by mouth every 6 (six) hours. Patient not taking: Reported on 03/08/2015 12/04/14   Elpidio Anis, PA-C  potassium chloride (K-DUR) 10 MEQ tablet Take 1 tablet (10 mEq total) by mouth daily. 03/08/15   Gilda Crease, MD  spironolactone (ALDACTONE) 25 MG tablet Take 1 tablet (25 mg total) by mouth daily. 03/08/15   Gilda Crease, MD    Family History No family history on file.  Social History Social History  Substance Use Topics  . Smoking status: Former Smoker    Packs/day: 0.25    Years: 10.00    Types: Cigarettes    Quit date: 09/30/2007  . Smokeless tobacco: Never Used  . Alcohol use Yes     Comment: heavily x 7 days     Allergies   Review of patient's allergies indicates no known allergies.   Review of Systems Review of Systems  All other systems reviewed and are negative.    Physical Exam Updated Vital Signs BP 113/66   Pulse 84   Temp 98.7 F (37.1 C) (Oral)   Resp 15   Ht 5\' 11"  (1.803 m)   Wt 102.1 kg   SpO2 (!) 82%   BMI 31.38 kg/m   Physical Exam  Constitutional: He appears well-developed. No distress.  HENT:  Head: Normocephalic and atraumatic.  Right Ear: External ear normal.  Left Ear: External ear normal.  Mouth/Throat: Oropharynx is clear and moist.  Eyes: Conjunctivae and EOM are normal. Pupils are equal, round, and reactive to light. Right eye exhibits no discharge. Left eye exhibits no discharge. No scleral icterus.  Neck: Normal range of motion.  Patient in c-collar  Cardiovascular: Normal rate and regular rhythm.   Pulmonary/Chest: Effort normal. No respiratory distress.  No increased work of breathing. Diminished breath sounds throughout.  Abdominal: Soft. He exhibits no distension. There is no tenderness.  Musculoskeletal: Normal range of  motion.  Moves all extremities spontaneously. No tenderness to palpation of the extremities. All joints are supple without swelling, deformity or tenderness. Mild tenderness to palpation over the thoracic and lumbar back. No focal points of tenderness. No bony deformities or step-offs of the spine.  Neurological: He is alert. No cranial nerve deficit. Coordination normal.  Cranial nerves grossly intact. 5/5 strength in all the extremities. Sensation to light touch intact throughout.  Skin: Skin is warm and dry.  Psychiatric: He has a normal mood and affect. His behavior is normal.  Nursing note and vitals reviewed.    ED Treatments / Results  Labs (all labs ordered are listed, but only abnormal results are displayed) Labs Reviewed  COMPREHENSIVE METABOLIC PANEL - Abnormal; Notable for the following:       Result Value  Glucose, Bld 118 (*)    BUN <5 (*)    Creatinine, Ser 0.55 (*)    Calcium 8.3 (*)    AST 184 (*)    ALT 86 (*)    Total Bilirubin 3.3 (*)    All other components within normal limits  ETHANOL - Abnormal; Notable for the following:    Alcohol, Ethyl (B) 411 (*)    All other components within normal limits  CBC - Abnormal; Notable for the following:    MCV 100.2 (*)    Platelets 65 (*)    All other components within normal limits  URINALYSIS, ROUTINE W REFLEX MICROSCOPIC (NOT AT [facility]) - Abnormal; Notable for the following:    Color, Urine AMBER (*)    Specific Gravity, Urine >1.046 (*)    Bilirubin Urine SMALL (*)    All other components within normal limits  URINE RAPID DRUG SCREEN, HOSP PERFORMED  BRAIN NATRIURETIC PEPTIDE  CK  I-STAT TROPOININ, ED  I-STAT TROPOININ, ED    EKG  EKG Interpretation  Date/Time:  Monday September 29 2015 21:59:50 EDT Ventricular Rate:  96 PR Interval:    QRS Duration: 118 QT Interval:  371 QTC Calculation: 469 R Axis:   -55 Text Interpretation:  Sinus rhythm Nonspecific IVCD with LAD Consider inferior infarct , old When  compared with ECG of 03/08/2015, No significant change was found Confirmed by [facility]  MD, Danh ([id number]) on 09/29/2015 11:54:53 PM       Radiology Dg Chest 2 View  Result Date: 09/29/2015 CLINICAL DATA:  Left-sided chest pain, ETOH intoxication EXAM: CHEST  2 VIEW COMPARISON:  03/08/2015 FINDINGS: Cardiac shadow remains enlarged but stable. The lungs are well aerated bilaterally. No sizable effusion or pneumothorax is noted. No bony abnormality is noted. IMPRESSION: No acute abnormality seen. Electronically Signed   By: Alcide Clever M.D.   On: 09/29/2015 23:15  Dg Thoracic Spine 2 View  Result Date: 09/29/2015 CLINICAL DATA:  Back pain, initial encounter EXAM: THORACIC SPINE 2 VIEWS COMPARISON:  None. FINDINGS: Vertebral body height is well maintained. Mild osteophytic changes are noted. There are changes of prior TIPS creation as well as embolotherapy. No paraspinal mass lesion is seen. Pedicles are within normal limits. IMPRESSION: Degenerative change without acute abnormality. Electronically Signed   By: Alcide Clever M.D.   On: 09/29/2015 23:17  Dg Lumbar Spine Complete  Result Date: 09/29/2015 CLINICAL DATA:  Low back pain and left leg pain, initial encounter EXAM: LUMBAR SPINE - COMPLETE 4+ VIEW COMPARISON:  None. FINDINGS: Five lumbar type vertebral bodies are well visualized. Vertebral body height is well maintained. Mild osteophytic changes are seen. No significant anterolisthesis is noted. No compression deformities are seen. There are changes consistent with prior TIPS creation and embolus therapy. IMPRESSION: Mild degenerative change without acute abnormality. Electronically Signed   By: Alcide Clever M.D.   On: 09/29/2015 23:16  Ct Head Wo Contrast  Result Date: 09/30/2015 CLINICAL DATA:  Alcohol intoxication. EXAM: CT HEAD WITHOUT CONTRAST CT CERVICAL SPINE WITHOUT CONTRAST TECHNIQUE: Multidetector CT imaging of the head and cervical spine was performed following the standard protocol  without intravenous contrast. Multiplanar CT image reconstructions of the cervical spine were also generated. COMPARISON:  Head CT - 12/15/2014 FINDINGS: CT HEAD FINDINGS Regional soft tissues appear normal. No radiopaque foreign body. No displaced calvarial fracture. Gray-white differentiation is maintained. No CT evidence of acute large territory infarct. No intraparenchymal or extra-axial mass or hemorrhage. Unchanged size a configuration of the ventricles  basilar cisterns. No midline shift. There is diffuse increased attenuation of the intracranial blood pool suggestive of volume depletion. Limited visualization the paranasal sinuses and mastoid air cells is normal. No air-fluid levels. CT CERVICAL SPINE FINDINGS C1 to the superior endplate of T3 is imaged. Mildly accentuated cervical lordosis. No anterolisthesis or retrolisthesis. The bilateral facets are normally aligned. Note is made of a punctate (approximately 3 mm) ossicle between the left right C5-C6 transverse dissects, likely the sequela of remote avulsive injury. Normal atlantodental and atlantoaxial articulations. The dens is normally positioned between the lateral masses of C1. No fracture or static subluxation of cervical spine. Cervical vertebral body heights are preserved. Prevertebral soft tissues are normal. Cervical intervertebral disc space heights are preserved. Regional soft tissues appear normal. No bulky cervical lymphadenopathy on this noncontrast examination. Limited visualization of lung apices is normal. Dystrophic calcification within the right lobe of the thyroid measuring approximately 6 mm in diameter (image 90, series 7). IMPRESSION: 1. Negative noncontrast head CT. 2. No fracture or static subluxation of the cervical spine. Electronically Signed   By: Simonne Come M.D.   On: 09/30/2015 00:15  Ct Angio Chest Pe W/cm &/or Wo Cm  Result Date: 09/30/2015 CLINICAL DATA:  51 year old male with hypoxia.  Evaluate for PE. EXAM: CT  ANGIOGRAPHY CHEST WITH CONTRAST TECHNIQUE: Multidetector CT imaging of the chest was performed using the standard protocol during bolus administration of intravenous contrast. Multiplanar CT image reconstructions and MIPs were obtained to evaluate the vascular anatomy. CONTRAST:  100 cc Isovue 370 COMPARISON:  Chest radiograph dated 09/29/2015 FINDINGS: Minimal bibasilar linear atelectasis/ scarring. The lungs are otherwise clear. There is no pleural effusion or pneumothorax. The central airways are patent. The thoracic aorta appears unremarkable. The origins of the great vessels of the aortic arch are patent. No CT evidence of pulmonary embolism. There is mild prominence of the main pulmonary trunk suggestive of underlying pulmonary hypertension. Mild cardiomegaly with mild dilatation of the right cardiac chambers. No pericardial effusion. No hilar or mediastinal adenopathy. The esophagus is grossly unremarkable. There is a 7 mm calcified right thyroid nodule. There is no axillary or supraclavicular adenopathy. The chest wall soft tissues appear unremarkable. There is degenerative changes of the spine. No acute fracture. Cirrhosis. Cholecystectomy. TIPS. Coiled upper abdominal varices noted. Review of the MIP images confirms the above findings. IMPRESSION: No CT evidence of pulmonary embolism. Electronically Signed   By: Elgie Collard M.D.   On: 09/30/2015 02:56  Ct Cervical Spine Wo Contrast  Result Date: 09/30/2015 CLINICAL DATA:  Alcohol intoxication. EXAM: CT HEAD WITHOUT CONTRAST CT CERVICAL SPINE WITHOUT CONTRAST TECHNIQUE: Multidetector CT imaging of the head and cervical spine was performed following the standard protocol without intravenous contrast. Multiplanar CT image reconstructions of the cervical spine were also generated. COMPARISON:  Head CT - 12/15/2014 FINDINGS: CT HEAD FINDINGS Regional soft tissues appear normal. No radiopaque foreign body. No displaced calvarial fracture. Gray-white  differentiation is maintained. No CT evidence of acute large territory infarct. No intraparenchymal or extra-axial mass or hemorrhage. Unchanged size a configuration of the ventricles basilar cisterns. No midline shift. There is diffuse increased attenuation of the intracranial blood pool suggestive of volume depletion. Limited visualization the paranasal sinuses and mastoid air cells is normal. No air-fluid levels. CT CERVICAL SPINE FINDINGS C1 to the superior endplate of T3 is imaged. Mildly accentuated cervical lordosis. No anterolisthesis or retrolisthesis. The bilateral facets are normally aligned. Note is made of a punctate (approximately 3 mm) ossicle between  the left right C5-C6 transverse dissects, likely the sequela of remote avulsive injury. Normal atlantodental and atlantoaxial articulations. The dens is normally positioned between the lateral masses of C1. No fracture or static subluxation of cervical spine. Cervical vertebral body heights are preserved. Prevertebral soft tissues are normal. Cervical intervertebral disc space heights are preserved. Regional soft tissues appear normal. No bulky cervical lymphadenopathy on this noncontrast examination. Limited visualization of lung apices is normal. Dystrophic calcification within the right lobe of the thyroid measuring approximately 6 mm in diameter (image 90, series 7). IMPRESSION: 1. Negative noncontrast head CT. 2. No fracture or static subluxation of the cervical spine. Electronically Signed   By: Simonne Come M.D.   On: 09/30/2015 00:15   Procedures Procedures (including critical care time)  Medications Ordered in ED Medications  LORazepam (ATIVAN) tablet 0-4 mg (2 mg Oral Given 09/30/15 1610)    Followed by  LORazepam (ATIVAN) tablet 0-4 mg (not administered)  thiamine (VITAMIN B-1) tablet 100 mg (100 mg Oral Given 09/30/15 1055)    Or  thiamine (B-1) injection 100 mg ( Intravenous See Alternative 09/30/15 1055)  iopamidol (ISOVUE-370) 76  % injection (100 mLs  Contrast Given 09/30/15 0234)  sodium chloride 0.9 % bolus 2,000 mL (0 mLs Intravenous Stopped 09/30/15 0830)     Initial Impression / Assessment and Plan / ED Course  I have reviewed the triage vital signs and the nursing notes.  Pertinent labs & imaging results that were available during my care of the patient were reviewed by me and considered in my medical decision making (see chart for details).  Clinical Course   Intoxicated male presenting with 3 falls, chest pain, shortness of breath and bilateral lower extremities edema. Chart review shows patient has had similar presentations in the past. Level V caveat secondary to intoxication. Patient is hypoxic to the low 80s-70s on room air. Nasal cannula at 5 L improves oxygen to 89%. Oxygen 100% on nonrebreather. Grossly normal neurological exam. No deformities or tenderness of the extremities. Mild tenderness of the thoracic and lumbar back. Abdomen soft, nontender. Ethanol 411. Thrombocythemia which appears to be at his baseline. No elevated BNP. Troponin negative with a nonischemic EKG. Chest x-ray negative. Imaging of head, neck and spine negative. CT chest and deltra trop pending. Patient will need to be monitored in the emergency department overnight while he sobers. Patient care is signed out to oncoming provider who will monitor patient and reassess his O2 status in the morning.   Final Clinical Impressions(s) / ED Diagnoses   Final diagnoses:  Hypoxia  Alcohol intoxication, with unspecified complication [facility])  Fall, initial encounter    New Prescriptions New Prescriptions   No medications on file     Alveta Heimlich, PA-C 09/30/15 1134    Laurence Spates, MD 10/10/15 1751

## 2015-09-29 NOTE — ED Notes (Signed)
PA notified on pt.'s elevated etoh level as reported by lab tech.

## 2015-09-29 NOTE — ED Triage Notes (Signed)
Pt in EMS, ETOH intoxication, pt found in [place], usually picked up in [place]. Pt reports L sided CP, leg pain, also stated "do they have something that can kill me" EMT at bedside translating everything

## 2015-09-30 ENCOUNTER — Emergency Department (HOSPITAL_COMMUNITY): Payer: Self-pay

## 2015-09-30 ENCOUNTER — Encounter (HOSPITAL_COMMUNITY): Payer: Self-pay | Admitting: Radiology

## 2015-09-30 DIAGNOSIS — R0902 Hypoxemia: Secondary | ICD-10-CM | POA: Diagnosis present

## 2015-09-30 DIAGNOSIS — F10929 Alcohol use, unspecified with intoxication, unspecified: Secondary | ICD-10-CM

## 2015-09-30 LAB — RAPID URINE DRUG SCREEN, HOSP PERFORMED
AMPHETAMINES: NOT DETECTED
Barbiturates: NOT DETECTED
Benzodiazepines: NOT DETECTED
COCAINE: NOT DETECTED
OPIATES: NOT DETECTED
Tetrahydrocannabinol: NOT DETECTED

## 2015-09-30 LAB — URINALYSIS, ROUTINE W REFLEX MICROSCOPIC
GLUCOSE, UA: NEGATIVE mg/dL
Hgb urine dipstick: NEGATIVE
Ketones, ur: NEGATIVE mg/dL
LEUKOCYTES UA: NEGATIVE
Nitrite: NEGATIVE
PROTEIN: NEGATIVE mg/dL
pH: 6 (ref 5.0–8.0)

## 2015-09-30 LAB — LIPID PANEL
CHOL/HDL RATIO: 2.9 ratio
CHOLESTEROL: 189 mg/dL (ref 0–200)
HDL: 66 mg/dL (ref >40)
LDL CALC: 98 mg/dL (ref 0–99)
Triglycerides: 123 mg/dL (ref <150)
VLDL: 25 mg/dL (ref 0–40)

## 2015-09-30 LAB — I-STAT TROPONIN, ED: Troponin i, poc: 0.01 ng/mL (ref 0.00–0.08)

## 2015-09-30 LAB — GLUCOSE, CAPILLARY: GLUCOSE-CAPILLARY: 137 mg/dL — AB (ref 65–99)

## 2015-09-30 LAB — CK: Total CK: 146 U/L (ref 49–397)

## 2015-09-30 LAB — TROPONIN I: Troponin I: <0.03 ng/mL (ref <0.03)

## 2015-09-30 LAB — TSH: TSH: 1.003 u[IU]/mL (ref 0.350–4.500)

## 2015-09-30 MED ORDER — THIAMINE HCL 100 MG/ML IJ SOLN: 100.0000 mg | Freq: Every day | INTRAMUSCULAR | Status: DC

## 2015-09-30 MED ORDER — LORAZEPAM 1 MG PO TABS
1.0000 mg | ORAL_TABLET | Freq: Four times a day (QID) | ORAL | Status: DC | PRN
Start: 2015-09-30 — End: 2015-10-02

## 2015-09-30 MED ORDER — LORAZEPAM 1 MG PO TABS: 0.0000 mg | ORAL_TABLET | Freq: Two times a day (BID) | ORAL | Status: DC

## 2015-09-30 MED ORDER — VITAMIN B-1 100 MG PO TABS
100.0000 mg | ORAL_TABLET | Freq: Every day | ORAL | Status: DC
  Administered 2015-09-30 – 2015-10-02 (×3): 100 mg via ORAL
  Filled 2015-09-30 (×3): qty 1

## 2015-09-30 MED ORDER — POLYETHYLENE GLYCOL 3350 17 G PO PACK: 17.0000 g | PACK | Freq: Every day | ORAL | Status: DC | PRN

## 2015-09-30 MED ORDER — FOLIC ACID 1 MG PO TABS
1.0000 mg | ORAL_TABLET | Freq: Every day | ORAL | Status: DC
  Administered 2015-09-30 – 2015-10-02 (×3): 1 mg via ORAL
  Filled 2015-09-30 (×3): qty 1

## 2015-09-30 MED ORDER — INSULIN ASPART 100 UNIT/ML ~~LOC~~ SOLN: 0.0000 [IU] | Freq: Every day | SUBCUTANEOUS | Status: DC

## 2015-09-30 MED ORDER — SODIUM CHLORIDE 0.9% FLUSH
3.0000 mL | Freq: Two times a day (BID) | INTRAVENOUS | Status: DC
Start: 2015-09-30 — End: 2015-10-02
  Administered 2015-09-30 – 2015-10-02 (×3): 3 mL via INTRAVENOUS

## 2015-09-30 MED ORDER — INSULIN ASPART 100 UNIT/ML ~~LOC~~ SOLN
0.0000 [IU] | Freq: Three times a day (TID) | SUBCUTANEOUS | Status: DC
Start: 2015-10-01 — End: 2015-10-02
  Administered 2015-10-01: 2 [IU] via SUBCUTANEOUS
  Administered 2015-10-01: 5 [IU] via SUBCUTANEOUS
  Administered 2015-10-01: 1 [IU] via SUBCUTANEOUS
  Administered 2015-10-02 (×2): 5 [IU] via SUBCUTANEOUS

## 2015-09-30 MED ORDER — SODIUM CHLORIDE 0.9 % IV BOLUS (SEPSIS)
2000.0000 mL | INTRAVENOUS | Status: AC
Start: 2015-09-30 — End: 2015-09-30
  Administered 2015-09-30: 2000 mL via INTRAVENOUS

## 2015-09-30 MED ORDER — ADULT MULTIVITAMIN W/MINERALS CH
1.0000 | ORAL_TABLET | Freq: Every day | ORAL | Status: DC
  Administered 2015-09-30 – 2015-10-02 (×3): 1 via ORAL
  Filled 2015-09-30 (×3): qty 1

## 2015-09-30 MED ORDER — LORAZEPAM 2 MG/ML IJ SOLN: 1.0000 mg | Freq: Four times a day (QID) | INTRAMUSCULAR | Status: DC | PRN

## 2015-09-30 MED ORDER — LACTULOSE 10 GM/15ML PO SOLN
20.0000 g | Freq: Two times a day (BID) | ORAL | Status: DC
  Administered 2015-09-30 – 2015-10-01 (×3): 20 g via ORAL
  Filled 2015-09-30 (×4): qty 30

## 2015-09-30 MED ORDER — FAMOTIDINE IN NACL 20-0.9 MG/50ML-% IV SOLN
20.0000 mg | Freq: Two times a day (BID) | INTRAVENOUS | Status: DC
  Administered 2015-09-30 – 2015-10-02 (×4): 20 mg via INTRAVENOUS
  Filled 2015-09-30 (×6): qty 50

## 2015-09-30 MED ORDER — LORAZEPAM 1 MG PO TABS
0.0000 mg | ORAL_TABLET | Freq: Four times a day (QID) | ORAL | Status: DC
  Administered 2015-09-30 (×2): 1 mg via ORAL
  Administered 2015-09-30: 2 mg via ORAL
  Filled 2015-09-30 (×2): qty 1
  Filled 2015-09-30: qty 2

## 2015-09-30 MED ORDER — SODIUM CHLORIDE 0.9 % IV SOLN
INTRAVENOUS | Status: DC
  Administered 2015-09-30 – 2015-10-01 (×2): via INTRAVENOUS

## 2015-09-30 MED ORDER — IOPAMIDOL (ISOVUE-370) INJECTION 76%
INTRAVENOUS | Status: AC
  Administered 2015-09-30: 100 mL
  Filled 2015-09-30: qty 100

## 2015-09-30 MED ORDER — VITAMIN B-1 100 MG PO TABS
100.0000 mg | ORAL_TABLET | Freq: Every day | ORAL | Status: DC
  Administered 2015-09-30: 100 mg via ORAL
  Filled 2015-09-30: qty 1

## 2015-09-30 MED ORDER — CARVEDILOL 3.125 MG PO TABS
3.1250 mg | ORAL_TABLET | Freq: Two times a day (BID) | ORAL | Status: DC
  Administered 2015-10-01 – 2015-10-02 (×4): 3.125 mg via ORAL
  Filled 2015-09-30 (×4): qty 1

## 2015-09-30 NOTE — ED Notes (Signed)
This RN with NT assistance ambulated pt 10 meters.  Pt became dizzy, reported nausea, and began to fall in hallway.  Pt was placed in wheelchair with no fall and returned to bed.  O2 sats dropped from 92% to 87%. Dell Ponto, PA, made aware.

## 2015-09-30 NOTE — ED Notes (Signed)
Ordered lunch tray 

## 2015-09-30 NOTE — ED Notes (Signed)
Pt placed on RA at 0800.  By 9643 O2 sats were 77%RA.  This RN placed pt on 3LO2, [place], and encouraged pt to take deep breaths.  Pt's sats rebounded to 95% within 3 minutes.  Henderson Cloud,. PA, made aware.

## 2015-09-30 NOTE — ED Notes (Signed)
Pt maintaining sats at 97% on 3L oxygen via [place]

## 2015-09-30 NOTE — ED Notes (Signed)
This RN tried pt on room air, pt's sats dropped to 82%.  Pt placed on 2L [place].  Sats improved.

## 2015-09-30 NOTE — ED Notes (Signed)
Attempted to call report

## 2015-09-30 NOTE — Progress Notes (Signed)
[name] [name] [id number] Admission Data: 09/30/2015 6:44 PM Attending Provider: Moses Manners, MD  [id number], [name] Scrape, MD Consults/ Treatment Team:   Bowdy Peretti is a 50 y.o. male patient admitted from ED awake, alert  & orientated  X 3,  Prior, VSS - Blood pressure 130/64, pulse 87, temperature 98.7 F (37.1 C), temperature source Oral, resp. rate 20, height 5\' 11"  (1.803 m), weight 102.1 kg (225 lb), SpO2 92 %., O2    2 L nasal cannular, no c/o shortness of breath, no c/o chest pain, no distress noted. Tele # [id number] placed.   IV site WDL:  SL at this time, may refer to [facility].  Allergies:  No Known Allergies   Past Medical History:  Diagnosis Date  . Alcohol abuse   . Cirrhosis (HCC)   . Diabetes mellitus without complication (HCC)   . GERD (gastroesophageal reflux disease)   . H/O hiatal hernia   . Headache(784.0)   . No pertinent past medical history   . Seizures (HCC)   . Shortness of breath   . Sleep apnea      Pt orientation to unit, room and routine. Information packet given to patient/family and safety video watched.  Admission INP armband ID verified with patient/family, and in place. SR up x 2, fall risk assessment complete with Patient and family verbalizing understanding of risks associated with falls. Pt verbalizes an understanding of how to use the call bell and to call for help before getting out of bed.  Skin, clean-dry- intact without evidence of bruising, or skin tears.   No evidence of skin break down noted on exam. Sitter noted to be at the bedside, safety check completed.      Will cont to monitor and assist as needed.  Kern Reap, RN 09/30/2015 6:44 PM

## 2015-09-30 NOTE — ED Notes (Signed)
Paged OPC to [id number] 

## 2015-09-30 NOTE — ED Provider Notes (Signed)
Care assumed from [facility], PA-C.  [name] [name] is a 50 y.o. male presents with EtOH and c/o acute exacerbation of his chronic chest pain and leg swelling.  Pt with hypoxia into the 80s on arrival.  No tachycardia.  EtOH is 411.  Pt has presented like this previously with low oxygen saturations while significantly intoxicated.  No hx of PE.  He has never had a CT scan for evaluation of this.  Previously his hypoxia has resolved when he sobers.     Physical Exam  BP 98/59   Pulse 80   Temp 98.7 F (37.1 C) (Oral)   Resp 14   Ht 5\' 11"  (1.803 m)   Wt 102.1 kg   SpO2 92%   BMI 31.38 kg/m   Physical Exam  Constitutional: He appears well-developed and well-nourished.  Pt sleeping Pt smells of EtOH  HENT:  Head: Normocephalic.  Eyes: Pupils are equal, round, and reactive to light.  Neck: Normal range of motion.  Cardiovascular: Normal rate and regular rhythm.   Pulmonary/Chest: Effort normal and breath sounds normal.  Abdominal: Soft. Bowel sounds are normal.  Musculoskeletal: Normal range of motion.  Neurological:  Pt sleeping, but answers questions when aroused  Skin: Skin is warm and dry. Capillary refill takes less than 2 seconds.     Results for orders placed or performed during the hospital encounter of 09/29/15  Comprehensive metabolic panel  Result Value Ref Range   Sodium 142 135 - 145 mmol/L   Potassium 3.8 3.5 - 5.1 mmol/L   Chloride 108 101 - 111 mmol/L   CO2 24 22 - 32 mmol/L   Glucose, Bld 118 (H) 65 - 99 mg/dL   BUN <5 (L) 6 - 20 mg/dL   Creatinine, Ser 4.09 (L) 0.61 - 1.24 mg/dL   Calcium 8.3 (L) 8.9 - 10.3 mg/dL   Total Protein 6.7 6.5 - 8.1 g/dL   Albumin 3.5 3.5 - 5.0 g/dL   AST 811 (H) 15 - 41 U/L   ALT 86 (H) 17 - 63 U/L   Alkaline Phosphatase 74 38 - 126 U/L   Total Bilirubin 3.3 (H) 0.3 - 1.2 mg/dL   GFR calc non Af Amer >60 >60 mL/min   GFR calc Af Amer >60 >60 mL/min   Anion gap 10 5 - 15  Ethanol  Result Value Ref Range   Alcohol,  Ethyl (B) 411 (HH) <5 mg/dL  cbc  Result Value Ref Range   WBC 4.6 4.0 - 10.5 K/uL   RBC 4.70 4.22 - 5.81 MIL/uL   Hemoglobin 16.0 13.0 - 17.0 g/dL   HCT 91.4 78.2 - 95.6 %   MCV 100.2 (H) 78.0 - 100.0 fL   MCH 34.0 26.0 - 34.0 pg   MCHC 34.0 30.0 - 36.0 g/dL   RDW 21.3 08.6 - 57.8 %   Platelets 65 (L) 150 - 400 K/uL  Brain natriuretic peptide  Result Value Ref Range   B Natriuretic Peptide 18.4 0.0 - 100.0 pg/mL  Urinalysis, Routine w reflex microscopic (not at [facility])  Result Value Ref Range   Color, Urine AMBER (A) YELLOW   APPearance CLEAR CLEAR   Specific Gravity, Urine >1.046 (H) 1.005 - 1.030   pH 6.0 5.0 - 8.0   Glucose, UA NEGATIVE NEGATIVE mg/dL   Hgb urine dipstick NEGATIVE NEGATIVE   Bilirubin Urine SMALL (A) NEGATIVE   Ketones, ur NEGATIVE NEGATIVE mg/dL   Protein, ur NEGATIVE NEGATIVE mg/dL   Nitrite NEGATIVE NEGATIVE  Leukocytes, UA NEGATIVE NEGATIVE  I-stat troponin, ED  Result Value Ref Range   Troponin i, poc 0.01 0.00 - 0.08 ng/mL   Comment 3           Dg Chest 2 View  Result Date: 09/29/2015 CLINICAL DATA:  Left-sided chest pain, ETOH intoxication EXAM: CHEST  2 VIEW COMPARISON:  03/08/2015 FINDINGS: Cardiac shadow remains enlarged but stable. The lungs are well aerated bilaterally. No sizable effusion or pneumothorax is noted. No bony abnormality is noted. IMPRESSION: No acute abnormality seen. Electronically Signed   By: Alcide Clever M.D.   On: 09/29/2015 23:15  Dg Thoracic Spine 2 View  Result Date: 09/29/2015 CLINICAL DATA:  Back pain, initial encounter EXAM: THORACIC SPINE 2 VIEWS COMPARISON:  None. FINDINGS: Vertebral body height is well maintained. Mild osteophytic changes are noted. There are changes of prior TIPS creation as well as embolotherapy. No paraspinal mass lesion is seen. Pedicles are within normal limits. IMPRESSION: Degenerative change without acute abnormality. Electronically Signed   By: Alcide Clever M.D.   On: 09/29/2015 23:17  Dg  Lumbar Spine Complete  Result Date: 09/29/2015 CLINICAL DATA:  Low back pain and left leg pain, initial encounter EXAM: LUMBAR SPINE - COMPLETE 4+ VIEW COMPARISON:  None. FINDINGS: Five lumbar type vertebral bodies are well visualized. Vertebral body height is well maintained. Mild osteophytic changes are seen. No significant anterolisthesis is noted. No compression deformities are seen. There are changes consistent with prior TIPS creation and embolus therapy. IMPRESSION: Mild degenerative change without acute abnormality. Electronically Signed   By: Alcide Clever M.D.   On: 09/29/2015 23:16  Ct Head Wo Contrast  Result Date: 09/30/2015 CLINICAL DATA:  Alcohol intoxication. EXAM: CT HEAD WITHOUT CONTRAST CT CERVICAL SPINE WITHOUT CONTRAST TECHNIQUE: Multidetector CT imaging of the head and cervical spine was performed following the standard protocol without intravenous contrast. Multiplanar CT image reconstructions of the cervical spine were also generated. COMPARISON:  Head CT - 12/15/2014 FINDINGS: CT HEAD FINDINGS Regional soft tissues appear normal. No radiopaque foreign body. No displaced calvarial fracture. Gray-white differentiation is maintained. No CT evidence of acute large territory infarct. No intraparenchymal or extra-axial mass or hemorrhage. Unchanged size a configuration of the ventricles basilar cisterns. No midline shift. There is diffuse increased attenuation of the intracranial blood pool suggestive of volume depletion. Limited visualization the paranasal sinuses and mastoid air cells is normal. No air-fluid levels. CT CERVICAL SPINE FINDINGS C1 to the superior endplate of T3 is imaged. Mildly accentuated cervical lordosis. No anterolisthesis or retrolisthesis. The bilateral facets are normally aligned. Note is made of a punctate (approximately 3 mm) ossicle between the left right C5-C6 transverse dissects, likely the sequela of remote avulsive injury. Normal atlantodental and atlantoaxial  articulations. The dens is normally positioned between the lateral masses of C1. No fracture or static subluxation of cervical spine. Cervical vertebral body heights are preserved. Prevertebral soft tissues are normal. Cervical intervertebral disc space heights are preserved. Regional soft tissues appear normal. No bulky cervical lymphadenopathy on this noncontrast examination. Limited visualization of lung apices is normal. Dystrophic calcification within the right lobe of the thyroid measuring approximately 6 mm in diameter (image 90, series 7). IMPRESSION: 1. Negative noncontrast head CT. 2. No fracture or static subluxation of the cervical spine. Electronically Signed   By: Simonne Come M.D.   On: 09/30/2015 00:15  Ct Angio Chest Pe W/cm &/or Wo Cm  Result Date: 09/30/2015 CLINICAL DATA:  50 year old male with hypoxia.  Evaluate for  PE. EXAM: CT ANGIOGRAPHY CHEST WITH CONTRAST TECHNIQUE: Multidetector CT imaging of the chest was performed using the standard protocol during bolus administration of intravenous contrast. Multiplanar CT image reconstructions and MIPs were obtained to evaluate the vascular anatomy. CONTRAST:  100 cc Isovue 370 COMPARISON:  Chest radiograph dated 09/29/2015 FINDINGS: Minimal bibasilar linear atelectasis/ scarring. The lungs are otherwise clear. There is no pleural effusion or pneumothorax. The central airways are patent. The thoracic aorta appears unremarkable. The origins of the great vessels of the aortic arch are patent. No CT evidence of pulmonary embolism. There is mild prominence of the main pulmonary trunk suggestive of underlying pulmonary hypertension. Mild cardiomegaly with mild dilatation of the right cardiac chambers. No pericardial effusion. No hilar or mediastinal adenopathy. The esophagus is grossly unremarkable. There is a 7 mm calcified right thyroid nodule. There is no axillary or supraclavicular adenopathy. The chest wall soft tissues appear unremarkable. There  is degenerative changes of the spine. No acute fracture. Cirrhosis. Cholecystectomy. TIPS. Coiled upper abdominal varices noted. Review of the MIP images confirms the above findings. IMPRESSION: No CT evidence of pulmonary embolism. Electronically Signed   By: Elgie Collard M.D.   On: 09/30/2015 02:56  Ct Cervical Spine Wo Contrast  Result Date: 09/30/2015 CLINICAL DATA:  Alcohol intoxication. EXAM: CT HEAD WITHOUT CONTRAST CT CERVICAL SPINE WITHOUT CONTRAST TECHNIQUE: Multidetector CT imaging of the head and cervical spine was performed following the standard protocol without intravenous contrast. Multiplanar CT image reconstructions of the cervical spine were also generated. COMPARISON:  Head CT - 12/15/2014 FINDINGS: CT HEAD FINDINGS Regional soft tissues appear normal. No radiopaque foreign body. No displaced calvarial fracture. Gray-white differentiation is maintained. No CT evidence of acute large territory infarct. No intraparenchymal or extra-axial mass or hemorrhage. Unchanged size a configuration of the ventricles basilar cisterns. No midline shift. There is diffuse increased attenuation of the intracranial blood pool suggestive of volume depletion. Limited visualization the paranasal sinuses and mastoid air cells is normal. No air-fluid levels. CT CERVICAL SPINE FINDINGS C1 to the superior endplate of T3 is imaged. Mildly accentuated cervical lordosis. No anterolisthesis or retrolisthesis. The bilateral facets are normally aligned. Note is made of a punctate (approximately 3 mm) ossicle between the left right C5-C6 transverse dissects, likely the sequela of remote avulsive injury. Normal atlantodental and atlantoaxial articulations. The dens is normally positioned between the lateral masses of C1. No fracture or static subluxation of cervical spine. Cervical vertebral body heights are preserved. Prevertebral soft tissues are normal. Cervical intervertebral disc space heights are preserved. Regional  soft tissues appear normal. No bulky cervical lymphadenopathy on this noncontrast examination. Limited visualization of lung apices is normal. Dystrophic calcification within the right lobe of the thyroid measuring approximately 6 mm in diameter (image 90, series 7). IMPRESSION: 1. Negative noncontrast head CT. 2. No fracture or static subluxation of the cervical spine. Electronically Signed   By: Simonne Come M.D.   On: 09/30/2015 00:15    ED Course  Procedures  Hypoxia  Alcohol intoxication, with unspecified complication (HCC)  Fall, initial encounter  MDM  Plan:  Pt to receive CT chest, delta trop and be allowed to sober.  We will reassess at that time.  If testing is negative and hypoxia has resolved when pt is clinically sober he may be d/c home.    6:09 AM Pt has been sleeping.  He continues to have hypoxia, but CT of the chest is without PE or PNA.  Pt is arousable without difficulty,  but is unable to stand without assistance.  CIWA 14.  Pt will receive fluids and ativan.    At shift change, pt care transferred to [name] Berry, PA-C who will reassess and determine disposition.  If he continues to have hypoxia or signs of withdrawal he will need admission.        Dahlia Client Ardyn [place], PA-C 09/30/15 0263    Laurence Spates, MD 10/10/15 1750

## 2015-09-30 NOTE — ED Provider Notes (Deleted)
Pt has been observed in the ED for 14 hours. He remains tremulous but is not tachycardic or hypertensive. Answers questions. Desats when sleeping but o2 sats fine when more awake. Imaging w/o acute abnormality. Denies SI. Will DC with outpt resources.    Raeford Razor, MD 09/30/15 [phone]

## 2015-09-30 NOTE — ED Notes (Signed)
Pt taken off of NRB by provider; sats dropped to 82%. Pt woken up, assisted to side of bed, then stood up. Pt able to stand with two staff members help, unsteady gait noted. Pt placed back in bed. C/o headache and R hand twitching.

## 2015-09-30 NOTE — ED Provider Notes (Signed)
Initial plan was to discharge. He continues to desaturated when sleeping. It does improve when awake, but cannot keep him alert for any significant length of time. Work-up has been reassuring. He has been observed in the ED for an extended period of time. We have tried getting him up more than once to ambulate. His O2 sats improve but his mentation is still very groggy. At this point this is probably more from the ativan he has been getting than from ETOH. Likely some degree of chronic cognitive impairment from longstanding alcohol abuse as well.    Raeford Razor, MD 09/30/15 1500

## 2015-09-30 NOTE — ED Provider Notes (Signed)
Patient given to me at shift change, with CK, UA and UDS pending. Briefly, pt presented yesterday with multiple complaints including chest pain, leg pain, suicidal ideations, had fallen multiple times, and was intoxicated.  Briefly patient was intoxicated with blood ETOH 411, hypoxic 70-80's on RA, requiring non-re breather.  Previously patient's hypoxia resolved when he sobers, plan was to reevaluate for clinical sobriety and resolution of hypoxia.  Laboratory significant for thrombocytopenia, consistent with his history. Elevated AST and ALT, more significantly elevated band prior labs over the past year, but consistent with history of cirrhosis.  Patient has history of heart failure however acutely fluid overloaded and suspected to be etiology of hypoxia, BNP and chest x-ray negative.  EKG sinus rhythm, no significant change from prior EKGs. Workup has included CTA of chest, head CT, neck CT, plain films of thoracic and lumbar spine and chest. Chest x-ray is negative CT angiogram of chest negative for pulmonary embolism,, suggestive of pulmonary hypertension. 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  CT head and neck negative.  C-spine films significant for mild degenerative changes without acute abnormality.  Urinalysis, urine drug screen and CK were obtained and patient voided [employer] colored urine. CK is negative, urinalysis pertinent for bili, urine drug screen negative.  Patient was reevaluated at 7 AM, has a worsening right arm tremor completely consistent with withdrawal, and see what protocol was given PO Ativan.  On 3 L nasal cannula patient was maintaining oxygen saturation to 96%, is not tachypneic or tachycardic.  Trial on room air which is persistent hypoxia to 77%. Patient will need to be admitted for further workup of hypoxia.  Results for orders placed or performed during the hospital encounter of 09/29/15  Comprehensive metabolic panel  Result Value Ref  Range   Sodium 142 135 - 145 mmol/L   Potassium 3.8 3.5 - 5.1 mmol/L   Chloride 108 101 - 111 mmol/L   CO2 24 22 - 32 mmol/L   Glucose, Bld 118 (H) 65 - 99 mg/dL   BUN <5 (L) 6 - 20 mg/dL   Creatinine, Ser 1.61 (L) 0.61 - 1.24 mg/dL   Calcium 8.3 (L) 8.9 - 10.3 mg/dL   Total Protein 6.7 6.5 - 8.1 g/dL   Albumin 3.5 3.5 - 5.0 g/dL   AST 096 (H) 15 - 41 U/L   ALT 86 (H) 17 - 63 U/L   Alkaline Phosphatase 74 38 - 126 U/L   Total Bilirubin 3.3 (H) 0.3 - 1.2 mg/dL   GFR calc non Af Amer >60 >60 mL/min   GFR calc Af Amer >60 >60 mL/min   Anion gap 10 5 - 15  Ethanol  Result Value Ref Range   Alcohol, Ethyl (B) 411 (HH) <5 mg/dL  cbc  Result Value Ref Range   WBC 4.6 4.0 - 10.5 K/uL   RBC 4.70 4.22 - 5.81 MIL/uL   Hemoglobin 16.0 13.0 - 17.0 g/dL   HCT 04.5 40.9 - 81.1 %   MCV 100.2 (H) 78.0 - 100.0 fL   MCH 34.0 26.0 - 34.0 pg   MCHC 34.0 30.0 - 36.0 g/dL   RDW 91.4 78.2 - 95.6 %   Platelets 65 (L) 150 - 400 K/uL  Rapid urine drug screen (hospital performed)  Result Value Ref Range   Opiates NONE DETECTED NONE DETECTED   Cocaine NONE DETECTED NONE DETECTED   Benzodiazepines NONE DETECTED NONE DETECTED   Amphetamines NONE DETECTED NONE DETECTED   Tetrahydrocannabinol NONE DETECTED  NONE DETECTED   Barbiturates NONE DETECTED NONE DETECTED  Brain natriuretic peptide  Result Value Ref Range   B Natriuretic Peptide 18.4 0.0 - 100.0 pg/mL  Urinalysis, Routine w reflex microscopic (not at [facility])  Result Value Ref Range   Color, Urine AMBER (A) YELLOW   APPearance CLEAR CLEAR   Specific Gravity, Urine >1.046 (H) 1.005 - 1.030   pH 6.0 5.0 - 8.0   Glucose, UA NEGATIVE NEGATIVE mg/dL   Hgb urine dipstick NEGATIVE NEGATIVE   Bilirubin Urine SMALL (A) NEGATIVE   Ketones, ur NEGATIVE NEGATIVE mg/dL   Protein, ur NEGATIVE NEGATIVE mg/dL   Nitrite NEGATIVE NEGATIVE   Leukocytes, UA NEGATIVE NEGATIVE  CK  Result Value Ref Range   Total CK 146 49 - 397 U/L  I-stat troponin, ED   Result Value Ref Range   Troponin i, poc 0.01 0.00 - 0.08 ng/mL   Comment 3           Dg Chest 2 View  Result Date: 09/29/2015 CLINICAL DATA:  Left-sided chest pain, ETOH intoxication EXAM: CHEST  2 VIEW COMPARISON:  03/08/2015 FINDINGS: Cardiac shadow remains enlarged but stable. The lungs are well aerated bilaterally. No sizable effusion or pneumothorax is noted. No bony abnormality is noted. IMPRESSION: No acute abnormality seen. Electronically Signed   By: Alcide Clever M.D.   On: 09/29/2015 23:15  Dg Thoracic Spine 2 View  Result Date: 09/29/2015 CLINICAL DATA:  Back pain, initial encounter EXAM: THORACIC SPINE 2 VIEWS COMPARISON:  None. FINDINGS: Vertebral body height is well maintained. Mild osteophytic changes are noted. There are changes of prior TIPS creation as well as embolotherapy. No paraspinal mass lesion is seen. Pedicles are within normal limits. IMPRESSION: Degenerative change without acute abnormality. Electronically Signed   By: Alcide Clever M.D.   On: 09/29/2015 23:17  Dg Lumbar Spine Complete  Result Date: 09/29/2015 CLINICAL DATA:  Low back pain and left leg pain, initial encounter EXAM: LUMBAR SPINE - COMPLETE 4+ VIEW COMPARISON:  None. FINDINGS: Five lumbar type vertebral bodies are well visualized. Vertebral body height is well maintained. Mild osteophytic changes are seen. No significant anterolisthesis is noted. No compression deformities are seen. There are changes consistent with prior TIPS creation and embolus therapy. IMPRESSION: Mild degenerative change without acute abnormality. Electronically Signed   By: Alcide Clever M.D.   On: 09/29/2015 23:16  Ct Head Wo Contrast  Result Date: 09/30/2015 CLINICAL DATA:  Alcohol intoxication. EXAM: CT HEAD WITHOUT CONTRAST CT CERVICAL SPINE WITHOUT CONTRAST TECHNIQUE: Multidetector CT imaging of the head and cervical spine was performed following the standard protocol without intravenous contrast. Multiplanar CT image  reconstructions of the cervical spine were also generated. COMPARISON:  Head CT - 12/15/2014 FINDINGS: CT HEAD FINDINGS Regional soft tissues appear normal. No radiopaque foreign body. No displaced calvarial fracture. Gray-white differentiation is maintained. No CT evidence of acute large territory infarct. No intraparenchymal or extra-axial mass or hemorrhage. Unchanged size a configuration of the ventricles basilar cisterns. No midline shift. There is diffuse increased attenuation of the intracranial blood pool suggestive of volume depletion. Limited visualization the paranasal sinuses and mastoid air cells is normal. No air-fluid levels. CT CERVICAL SPINE FINDINGS C1 to the superior endplate of T3 is imaged. Mildly accentuated cervical lordosis. No anterolisthesis or retrolisthesis. The bilateral facets are normally aligned. Note is made of a punctate (approximately 3 mm) ossicle between the left right C5-C6 transverse dissects, likely the sequela of remote avulsive injury. Normal atlantodental and atlantoaxial articulations.  The dens is normally positioned between the lateral masses of C1. No fracture or static subluxation of cervical spine. Cervical vertebral body heights are preserved. Prevertebral soft tissues are normal. Cervical intervertebral disc space heights are preserved. Regional soft tissues appear normal. No bulky cervical lymphadenopathy on this noncontrast examination. Limited visualization of lung apices is normal. Dystrophic calcification within the right lobe of the thyroid measuring approximately 6 mm in diameter (image 90, series 7). IMPRESSION: 1. Negative noncontrast head CT. 2. No fracture or static subluxation of the cervical spine. Electronically Signed   By: Simonne Come M.D.   On: 09/30/2015 00:15  Ct Angio Chest Pe W/cm &/or Wo Cm  Result Date: 09/30/2015 CLINICAL DATA:  50 year old male with hypoxia.  Evaluate for PE. EXAM: CT ANGIOGRAPHY CHEST WITH CONTRAST TECHNIQUE:  Multidetector CT imaging of the chest was performed using the standard protocol during bolus administration of intravenous contrast. Multiplanar CT image reconstructions and MIPs were obtained to evaluate the vascular anatomy. CONTRAST:  100 cc Isovue 370 COMPARISON:  Chest radiograph dated 09/29/2015 FINDINGS: Minimal bibasilar linear atelectasis/ scarring. The lungs are otherwise clear. There is no pleural effusion or pneumothorax. The central airways are patent. The thoracic aorta appears unremarkable. The origins of the great vessels of the aortic arch are patent. No CT evidence of pulmonary embolism. There is mild prominence of the main pulmonary trunk suggestive of underlying pulmonary hypertension. Mild cardiomegaly with mild dilatation of the right cardiac chambers. No pericardial effusion. No hilar or mediastinal adenopathy. The esophagus is grossly unremarkable. There is a 7 mm calcified right thyroid nodule. There is no axillary or supraclavicular adenopathy. The chest wall soft tissues appear unremarkable. There is degenerative changes of the spine. No acute fracture. Cirrhosis. Cholecystectomy. TIPS. Coiled upper abdominal varices noted. Review of the MIP images confirms the above findings. IMPRESSION: No CT evidence of pulmonary embolism. Electronically Signed   By: Elgie Collard M.D.   On: 09/30/2015 02:56  Ct Cervical Spine Wo Contrast  Result Date: 09/30/2015 CLINICAL DATA:  Alcohol intoxication. EXAM: CT HEAD WITHOUT CONTRAST CT CERVICAL SPINE WITHOUT CONTRAST TECHNIQUE: Multidetector CT imaging of the head and cervical spine was performed following the standard protocol without intravenous contrast. Multiplanar CT image reconstructions of the cervical spine were also generated. COMPARISON:  Head CT - 12/15/2014 FINDINGS: CT HEAD FINDINGS Regional soft tissues appear normal. No radiopaque foreign body. No displaced calvarial fracture. Gray-white differentiation is maintained. No CT evidence  of acute large territory infarct. No intraparenchymal or extra-axial mass or hemorrhage. Unchanged size a configuration of the ventricles basilar cisterns. No midline shift. There is diffuse increased attenuation of the intracranial blood pool suggestive of volume depletion. Limited visualization the paranasal sinuses and mastoid air cells is normal. No air-fluid levels. CT CERVICAL SPINE FINDINGS C1 to the superior endplate of T3 is imaged. Mildly accentuated cervical lordosis. No anterolisthesis or retrolisthesis. The bilateral facets are normally aligned. Note is made of a punctate (approximately 3 mm) ossicle between the left right C5-C6 transverse dissects, likely the sequela of remote avulsive injury. Normal atlantodental and atlantoaxial articulations. The dens is normally positioned between the lateral masses of C1. No fracture or static subluxation of cervical spine. Cervical vertebral body heights are preserved. Prevertebral soft tissues are normal. Cervical intervertebral disc space heights are preserved. Regional soft tissues appear normal. No bulky cervical lymphadenopathy on this noncontrast examination. Limited visualization of lung apices is normal. Dystrophic calcification within the right lobe of the thyroid measuring approximately 6 mm in  diameter (image 90, series 7). IMPRESSION: 1. Negative noncontrast head CT. 2. No fracture or static subluxation of the cervical spine. Electronically Signed   By: Simonne Come M.D.   On: 09/30/2015 00:15  A&P:  Hypoxia Alcohol intoxication with withdrawal  Family Medicine called for admission, spoke with Dr. Kennon Rounds, she requested Dr. Juleen China eval, and she would come and see pt, but felt that pt did not meet admission criteria if still clinically intoxicated.  Pt was evaluated by Dr. Kennon Rounds and discussed pt with Dr. Juleen China.  From 0800 to 1500 pt has been on CIWA protocol, has been monitored in the ED and has been alert and coherent.  He continues to  desaturate, on RA to between 77 and 89%.  Pt was unable to ambulate and maintain sats, highest observed while alert on RA is 90%.  Pt will be admitted to obs for further monitoring for hypoxemia.  Dr. Juleen China has seen and evaluated the pt and discussed case with Dr. Natale Milch, who agreed to obs admission, please see his documentation for further information.  Vitals:   09/30/15 1510 09/30/15 1515 09/30/15 1530 09/30/15 1600  BP:   124/70 134/94  Pulse: 99 94 86 95  Resp: (!) 9 21 16 15   Temp:      TempSrc:      SpO2: (!) 89% 90% 96% 96%  Weight:      Height:           Danelle Berry, PA-C 09/30/15 1630    Dione Booze, MD 09/30/15 2110

## 2015-10-01 ENCOUNTER — Observation Stay (HOSPITAL_BASED_OUTPATIENT_CLINIC_OR_DEPARTMENT_OTHER): Payer: Self-pay

## 2015-10-01 ENCOUNTER — Encounter (HOSPITAL_COMMUNITY): Payer: Self-pay | Admitting: General Practice

## 2015-10-01 ENCOUNTER — Observation Stay (HOSPITAL_COMMUNITY): Payer: Self-pay

## 2015-10-01 DIAGNOSIS — I493 Ventricular premature depolarization: Secondary | ICD-10-CM

## 2015-10-01 DIAGNOSIS — R072 Precordial pain: Secondary | ICD-10-CM

## 2015-10-01 DIAGNOSIS — K7030 Alcoholic cirrhosis of liver without ascites: Secondary | ICD-10-CM

## 2015-10-01 DIAGNOSIS — R079 Chest pain, unspecified: Secondary | ICD-10-CM

## 2015-10-01 DIAGNOSIS — F1010 Alcohol abuse, uncomplicated: Secondary | ICD-10-CM

## 2015-10-01 DIAGNOSIS — D6959 Other secondary thrombocytopenia: Secondary | ICD-10-CM

## 2015-10-01 DIAGNOSIS — R0602 Shortness of breath: Secondary | ICD-10-CM

## 2015-10-01 DIAGNOSIS — F10120 Alcohol abuse with intoxication, uncomplicated: Secondary | ICD-10-CM

## 2015-10-01 DIAGNOSIS — I8510 Secondary esophageal varices without bleeding: Secondary | ICD-10-CM

## 2015-10-01 LAB — TROPONIN I: Troponin I: <0.03 ng/mL (ref <0.03)

## 2015-10-01 LAB — CBC
HCT: 42 % (ref 39.0–52.0)
HEMOGLOBIN: 14 g/dL (ref 13.0–17.0)
MCH: 34 pg (ref 26.0–34.0)
MCHC: 33.3 g/dL (ref 30.0–36.0)
MCV: 101.9 fL — ABNORMAL HIGH (ref 78.0–100.0)
Platelets: 32 10*3/uL — ABNORMAL LOW (ref 150–400)
RBC: 4.12 MIL/uL — AB (ref 4.22–5.81)
RDW: 12.6 % (ref 11.5–15.5)
WBC: 2.2 10*3/uL — AB (ref 4.0–10.5)

## 2015-10-01 LAB — ECHOCARDIOGRAM COMPLETE
Height: 71 in
WEIGHTICAEL: 3600 [oz_av]

## 2015-10-01 LAB — COMPREHENSIVE METABOLIC PANEL
ALT: 67 U/L — ABNORMAL HIGH (ref 17–63)
AST: 116 U/L — ABNORMAL HIGH (ref 15–41)
Albumin: 2.8 g/dL — ABNORMAL LOW (ref 3.5–5.0)
Alkaline Phosphatase: 65 U/L (ref 38–126)
Anion gap: 7 (ref 5–15)
BILIRUBIN TOTAL: 3.4 mg/dL — AB (ref 0.3–1.2)
BUN: <5 mg/dL — ABNORMAL LOW (ref 6–20)
CHLORIDE: 107 mmol/L (ref 101–111)
CO2: 28 mmol/L (ref 22–32)
CREATININE: 0.45 mg/dL — AB (ref 0.61–1.24)
Calcium: 8.3 mg/dL — ABNORMAL LOW (ref 8.9–10.3)
Glucose, Bld: 116 mg/dL — ABNORMAL HIGH (ref 65–99)
POTASSIUM: 3.3 mmol/L — AB (ref 3.5–5.1)
Sodium: 142 mmol/L (ref 135–145)
TOTAL PROTEIN: 5.7 g/dL — AB (ref 6.5–8.1)

## 2015-10-01 LAB — GLUCOSE, CAPILLARY
GLUCOSE-CAPILLARY: 128 mg/dL — AB (ref 65–99)
GLUCOSE-CAPILLARY: 128 mg/dL — AB (ref 65–99)
Glucose-Capillary: 121 mg/dL — ABNORMAL HIGH (ref 65–99)
Glucose-Capillary: 207 mg/dL — ABNORMAL HIGH (ref 65–99)
Glucose-Capillary: 106 mg/dL — ABNORMAL HIGH (ref 65–99)

## 2015-10-01 MED ORDER — CETYLPYRIDINIUM CHLORIDE 0.05 % MT LIQD
7.0000 mL | Freq: Two times a day (BID) | OROMUCOSAL | Status: DC
  Administered 2015-10-01 – 2015-10-02 (×3): 7 mL via OROMUCOSAL

## 2015-10-01 MED ORDER — ACETAMINOPHEN 325 MG PO TABS
650.0000 mg | ORAL_TABLET | Freq: Four times a day (QID) | ORAL | Status: DC | PRN
  Administered 2015-10-01 – 2015-10-02 (×3): 650 mg via ORAL
  Filled 2015-10-01 (×3): qty 2

## 2015-10-01 NOTE — Care Management Note (Signed)
Case Management Note  Patient Details  Name: [name] [name] MRN: [id number] Date of Birth: [birth year]  Subjective/Objective:                 Patient with Hx ETOH, cirrhosis, esoph varices, admitted with hypoxemia, on suicide precautions, has sitter   Action/Plan:  Dispo pending psych eval.  Expected Discharge Date:                  Expected Discharge Plan:     In-House Referral:  Clinical Social Work  Discharge planning Services  CM Consult  Post Acute Care Choice:    Choice offered to:     DME Arranged:    DME Agency:     HH Arranged:    HH Agency:     Status of Service:  In process, will continue to follow  If discussed at Long Length of Stay Meetings, dates discussed:    Additional Comments:  Lawerance Sabal, RN 10/01/2015, 2:44 PM

## 2015-10-01 NOTE — Progress Notes (Signed)
  Echocardiogram 2D Echocardiogram has been performed.  Cathie Beams 10/01/2015, 2:56 PM

## 2015-10-01 NOTE — H&P (Signed)
Family Medicine Teaching [facility] Admission History and Physical Service Pager: [phone]  Patient name: Math Finck Medical record number: [id number] Date of birth: 12/11/1965 Age: 50 y.o. Gender: male  Primary Care Provider: Jeanann Lewandowsky, MD Consultants: none Code Status: Full   Chief Complaint: falls  Assessment and Plan: Wesely Mccammon is a 50 y.o. male with a past medical history significant for alcohol cirrhosis with varices, HTN, GERD,DM type II and alcohol abuse and sleep apnea who admitted for hypoxia after treatment for symptoms consistent with alcohol withdrawal.   #Hypoxia, acute Patient was being treated in the ER for alcohol withdrawal after multiple falls yesterday while  patient was intoxicated. Patient received some ativan which depressed his respiratory drive and led to some hypoxia. Patient was place on 6L [place] and was satting in low 90's, patient was move to NRB on 10L. CTA was negative for PE. Patient was doing better upon admission to the floor and was on 2L [place] with good saturation mid 90. CXR clear, CTA chest without PE. Could consider aspiration pneumonitis in setting of alcohol intoxication. --Obs, tele, FMTS, attending Fletke --Monitor Vitals --Place on pulse oximetry and telemetry --Follow up on AM CBC and CMP - wean O2 as tolerated O/N - consider repeat CXR in AM if hypoxia persists  #Multiple falls in setting of alcohol intoxication All imaging (CT head, CT C spine, XR L/T spine) negative for acute processes in ED. No other traumatic injuries noted or reported. CK normal - Monitor closely for other signs of injury - Up with assistance  #Bloody emesis possibly 2/2 varices Patient with a history of gastric varices with TIPS procedure and coiling noted on abdominal CT. Patient report vomiting a cup of blood, however hemoglobin is 16. Will continue to monitor. Also consider esophageal tear in setting of vomiting. --F/u am CBC --Monitor for  bleed --Continue home Coreg 3.125mg  BID --Consider GI consult with repeat episode --Famotidine 20 IV BID --continue home Lactulose 10 gm  #Thrombocytopenia Patient with long history of alcohol abuse which would explain low platelets. Will continue to monitor in the setting of recent bleed and liver cirrhosis. Platelets 65, which is near patient's baseline -F/u am CBC  #Chest pain, acute Patient report chest pain localized and reproducible on exam. Most likely musculoskeletal in nature after multiple falls the day prior. Initial troponin and EKG unremarkable. --Will trend troponin --Place on telemetry --Am EKG  #Alcohol abuse/withdrawal Patient with history severe alcohol withdrawal with seizure, visual and tactile hallucinations, tremors and diaphoresis. Patient last drink was 24 hr ago. Patient in the window for withdrawal. Patient presented with intoxication, EtOH level 411. --Give thiamine and folate  --Start on CIWA protocol  #DM2: Last A1c 7.2 in 10/2014. Only prescribed metformin 500mg  BID at home. Unclear if he is taking this currently -- monitor CBGs --moderate SSI - hold home metformin - check A1c  FEN/GI: clear liquids, NS @ 28mL/hr Prophylaxis: SCD  Disposition: Obs, tele for hypoxia and alcohol withdrawal symptoms   History of Present Illness:  Nicanor Cappelletti is a 50 y.o. male with a past medical history significant for alcoholic cirrhosis with varices, HTN, GERD,DM type II and alcohol abuse and sleep apnea who presented to the ED after multiple falls secondary to alcohol abuse. Patient was admitted to the ER after multiple falls at home after consuming one 40oz beer (per report) and vomiting some blood. Patient endorse some tremors and chest pain. Patient denies anySOB, fever, chills and diarrhea. Patient reports severe withdrawal symptoms in  the past with seizure and tactile and visual hallucinations.   He describes chest pain as intermittent for last day, left sided,  and sharp.  No associated SOB, diaphoresis, N/V, or radiation.  This is worse after vomiting.  He also has similar pain in epigastrium.  He reports one episode of vomiting frank blood (1 small cup's worth) on the night prior to admission.  This has happened in the past and he had EGD later.  In the ED, patient was given IVF, and given ativan started on CIWA. Chest CT was negative for evidence of PE after patient acutely desaturated. CT head was negative also for any bleed or cervical spine subluxation in the setting of multiple fall.   Review Of Systems: Per HPI otherwise the remainder of the systems were negative.  Patient Active Problem List   Diagnosis Date Noted  . Hypoxemia 09/30/2015  . Alcohol intoxication (HCC) 09/30/2015  . Hypoxia 09/30/2015  . Acute diastolic CHF (congestive heart failure) (HCC) 11/06/2014  . History of cirrhosis of liver   . Pulmonary vascular congestion 11/05/2014  . Volume overload 11/05/2014  . Diastolic dysfunction with acute on chronic heart failure (HCC) 11/05/2014  . Abdominal pain, chronic, epigastric 11/05/2014  . Diabetes (HCC) 07/24/2013  . HTN (hypertension) 07/24/2013  . Left arm weakness 06/12/2013  . S/P cholecystectomy 06/12/2013  . Splenomegaly 06/12/2013  . Encephalopathy 06/11/2013  . Liver cirrhosis, alcoholic (HCC) 11/02/2012  . DM (diabetes mellitus) type 2, uncontrolled, with ketoacidosis (HCC) 11/02/2012  . Pain in joint, lower leg 11/02/2012  . Syncopal episodes 10/21/2012  . Abdominal pain 10/21/2012  . Alcohol abuse 01/29/2012  . Alcoholic cirrhosis (HCC) 01/29/2012  . Thrombocytopenia, secondary 01/29/2012  . DM type 2 (diabetes mellitus, type 2) (HCC) 01/29/2012  . Esophageal varices in alcoholic cirrhosis (HCC) 01/29/2012  . History of GI bleed 01/29/2012    Past Medical History: Past Medical History:  Diagnosis Date  . Alcohol abuse   . Cirrhosis (HCC)   . Diabetes mellitus without complication (HCC)   . GERD  (gastroesophageal reflux disease)   . H/O hiatal hernia   . Headache(784.0)   . No pertinent past medical history   . Seizures (HCC)   . Shortness of breath   . Sleep apnea     Past Surgical History: Past Surgical History:  Procedure Laterality Date  . APPENDECTOMY    . NO PAST SURGERIES    . RADIOLOGY WITH ANESTHESIA  01/30/2012   Procedure: RADIOLOGY WITH ANESTHESIA;  Surgeon: Casimiro Needle T. Miles Costain, MD;  Location: [facility] OR;  Service: Radiology;  Laterality: N/A;    Social History: Social History  Substance Use Topics  . Smoking status: Former Smoker    Packs/day: 0.25    Years: 10.00    Types: Cigarettes    Quit date: 09/30/2007  . Smokeless tobacco: Never Used  . Alcohol use Yes     Comment: heavily x 7 days     Family History:  Allergies and Medications: No Known Allergies No current facility-administered medications on file prior to encounter.    Current Outpatient Prescriptions on File Prior to Encounter  Medication Sig Dispense Refill  . carvedilol (COREG) 3.125 MG tablet Take 1 tablet (3.125 mg total) by mouth 2 (two) times daily with a meal. 60 tablet 3  . chlordiazePOXIDE (LIBRIUM) 25 MG capsule 50mg  PO TID x 1D, then 25-50mg  PO BID X 1D, then 25-50mg  PO QD X 1D 10 capsule 0  . furosemide (LASIX) 40 MG tablet Take  1 tablet (40 mg total) by mouth daily. 30 tablet 3  . lactulose (CHRONULAC) 10 GM/15ML solution Take 30 mLs (20 g total) by mouth 2 (two) times daily. 946 mL 1  . metFORMIN (GLUCOPHAGE) 500 MG tablet Take 1 tablet (500 mg total) by mouth 2 (two) times daily with a meal. 60 tablet 3  . omeprazole (PRILOSEC) 20 MG capsule Take 1 capsule (20 mg total) by mouth daily. 20 capsule 0  . ondansetron (ZOFRAN) 4 MG tablet Take 1 tablet (4 mg total) by mouth every 6 (six) hours. (Patient not taking: Reported on 03/08/2015) 12 tablet 0  . potassium chloride (K-DUR) 10 MEQ tablet Take 1 tablet (10 mEq total) by mouth daily. 7 tablet 0  . spironolactone (ALDACTONE) 25 MG  tablet Take 1 tablet (25 mg total) by mouth daily. 30 tablet 3    Objective: BP (!) 109/51 (BP Location: Left Arm)   Pulse 73   Temp 98 F (36.7 C)   Resp 19   Ht  (1.803 m)   Wt 225 lb (102.1 kg)   SpO2 90%   BMI 31.38 kg/m  Exam: Physical Exam  Constitutional: He is oriented to person, place, and time. He appears well-developed and well-nourished.  HENT:  Head: Normocephalic and atraumatic.  Nose: Nose normal.  Eyes: Conjunctivae and EOM are normal. Pupils are equal, round, and reactive to light.  Neck: Normal range of motion. Neck supple.  Cardiovascular: Normal rate, regular rhythm and normal heart sounds.  Exam reveals no gallop and no friction rub.   No murmur heard. Pulmonary/Chest: Effort normal and breath sounds normal.  Abdominal: Soft. Bowel sounds are normal. He exhibits no distension. There is no tenderness. There is no guarding.  Musculoskeletal: Normal range of motion.       Right shoulder: He exhibits no swelling.       Right lower leg: He exhibits edema.       Left lower leg: He exhibits edema.  Neurological: He is alert and oriented to person, place, and time. He has normal reflexes.  Skin: Skin is warm and dry.  Psychiatric: He has a normal mood and affect. His behavior is normal. Judgment and thought content normal.   additionally:  HEENT: moderately dry MM Abd: diffusely TTP except in epigastrium, no rebound or guarding, unable to appreciate HSM Ext: 1+ pitting edema, no calf tenderness, neg [place] Psych: denies SI/HI (reports he was joking about this earlier  Labs and Imaging: CBC BMET   Recent Labs Lab 09/29/15 2200  WBC 4.6  HGB 16.0  HCT 47.1  PLT 65*    Recent Labs Lab 09/29/15 2200  NA 142  K 3.8  CL 108  CO2 24  BUN <5*  CREATININE 0.55*  GLUCOSE 118*  CALCIUM 8.3*      Troponin 0.01  EKG IVCD, unchanged from previous  EtOH 411 CK 146 BNP 18.4  Urinalysis    Component Value Date/Time   COLORURINE AMBER (A)  09/30/2015 0607   APPEARANCEUR CLEAR 09/30/2015 0607   LABSPEC >1.046 (H) 09/30/2015 0607   PHURINE 6.0 09/30/2015 0607   GLUCOSEU NEGATIVE 09/30/2015 0607   HGBUR NEGATIVE 09/30/2015 0607   BILIRUBINUR SMALL (A) 09/30/2015 0607   KETONESUR NEGATIVE 09/30/2015 0607   PROTEINUR NEGATIVE 09/30/2015 0607   UROBILINOGEN 4.0 (H) 06/11/2013 1912   NITRITE NEGATIVE 09/30/2015 0607   LEUKOCYTESUR NEGATIVE 09/30/2015 0607    Drugs of Abuse     Component Value Date/Time   LABOPIA NONE  DETECTED 09/30/2015 0607   COCAINSCRNUR NONE DETECTED 09/30/2015 0607   LABBENZ NONE DETECTED 09/30/2015 0607   AMPHETMU NONE DETECTED 09/30/2015 0607   THCU NONE DETECTED 09/30/2015 0607   LABBARB NONE DETECTED 09/30/2015 0607     Dg Chest 2 View  Result Date: 09/29/2015 CLINICAL DATA:  Left-sided chest pain, ETOH intoxication EXAM: CHEST  2 VIEW COMPARISON:  03/08/2015 FINDINGS: Cardiac shadow remains enlarged but stable. The lungs are well aerated bilaterally. No sizable effusion or pneumothorax is noted. No bony abnormality is noted. IMPRESSION: No acute abnormality seen. Electronically Signed   By: Alcide Clever M.D.   On: 09/29/2015 23:15  Dg Thoracic Spine 2 View  Result Date: 09/29/2015 CLINICAL DATA:  Back pain, initial encounter EXAM: THORACIC SPINE 2 VIEWS COMPARISON:  None. FINDINGS: Vertebral body height is well maintained. Mild osteophytic changes are noted. There are changes of prior TIPS creation as well as embolotherapy. No paraspinal mass lesion is seen. Pedicles are within normal limits. IMPRESSION: Degenerative change without acute abnormality. Electronically Signed   By: Alcide Clever M.D.   On: 09/29/2015 23:17  Dg Lumbar Spine Complete  Result Date: 09/29/2015 CLINICAL DATA:  Low back pain and left leg pain, initial encounter EXAM: LUMBAR SPINE - COMPLETE 4+ VIEW COMPARISON:  None. FINDINGS: Five lumbar type vertebral bodies are well visualized. Vertebral body height is well maintained.  Mild osteophytic changes are seen. No significant anterolisthesis is noted. No compression deformities are seen. There are changes consistent with prior TIPS creation and embolus therapy. IMPRESSION: Mild degenerative change without acute abnormality. Electronically Signed   By: Alcide Clever M.D.   On: 09/29/2015 23:16  Ct Head Wo Contrast  Result Date: 09/30/2015 CLINICAL DATA:  Alcohol intoxication. EXAM: CT HEAD WITHOUT CONTRAST CT CERVICAL SPINE WITHOUT CONTRAST TECHNIQUE: Multidetector CT imaging of the head and cervical spine was performed following the standard protocol without intravenous contrast. Multiplanar CT image reconstructions of the cervical spine were also generated. COMPARISON:  Head CT - 12/15/2014 FINDINGS: CT HEAD FINDINGS Regional soft tissues appear normal. No radiopaque foreign body. No displaced calvarial fracture. Gray-white differentiation is maintained. No CT evidence of acute large territory infarct. No intraparenchymal or extra-axial mass or hemorrhage. Unchanged size a configuration of the ventricles basilar cisterns. No midline shift. There is diffuse increased attenuation of the intracranial blood pool suggestive of volume depletion. Limited visualization the paranasal sinuses and mastoid air cells is normal. No air-fluid levels. CT CERVICAL SPINE FINDINGS C1 to the superior endplate of T3 is imaged. Mildly accentuated cervical lordosis. No anterolisthesis or retrolisthesis. The bilateral facets are normally aligned. Note is made of a punctate (approximately 3 mm) ossicle between the left right C5-C6 transverse dissects, likely the sequela of remote avulsive injury. Normal atlantodental and atlantoaxial articulations. The dens is normally positioned between the lateral masses of C1. No fracture or static subluxation of cervical spine. Cervical vertebral body heights are preserved. Prevertebral soft tissues are normal. Cervical intervertebral disc space heights are preserved.  Regional soft tissues appear normal. No bulky cervical lymphadenopathy on this noncontrast examination. Limited visualization of lung apices is normal. Dystrophic calcification within the right lobe of the thyroid measuring approximately 6 mm in diameter (image 90, series 7). IMPRESSION: 1. Negative noncontrast head CT. 2. No fracture or static subluxation of the cervical spine. Electronically Signed   By: Simonne Come M.D.   On: 09/30/2015 00:15  Ct Angio Chest Pe W/cm &/or Wo Cm  Result Date: 09/30/2015 CLINICAL DATA:  50 year old male  with hypoxia.  Evaluate for PE. EXAM: CT ANGIOGRAPHY CHEST WITH CONTRAST TECHNIQUE: Multidetector CT imaging of the chest was performed using the standard protocol during bolus administration of intravenous contrast. Multiplanar CT image reconstructions and MIPs were obtained to evaluate the vascular anatomy. CONTRAST:  100 cc Isovue 370 COMPARISON:  Chest radiograph dated 09/29/2015 FINDINGS: Minimal bibasilar linear atelectasis/ scarring. The lungs are otherwise clear. There is no pleural effusion or pneumothorax. The central airways are patent. The thoracic aorta appears unremarkable. The origins of the great vessels of the aortic arch are patent. No CT evidence of pulmonary embolism. There is mild prominence of the main pulmonary trunk suggestive of underlying pulmonary hypertension. Mild cardiomegaly with mild dilatation of the right cardiac chambers. No pericardial effusion. No hilar or mediastinal adenopathy. The esophagus is grossly unremarkable. There is a 7 mm calcified right thyroid nodule. There is no axillary or supraclavicular adenopathy. The chest wall soft tissues appear unremarkable. There is degenerative changes of the spine. No acute fracture. Cirrhosis. Cholecystectomy. TIPS. Coiled upper abdominal varices noted. Review of the MIP images confirms the above findings. IMPRESSION: No CT evidence of pulmonary embolism. Electronically Signed   By: Elgie Collard  M.D.   On: 09/30/2015 02:56  Ct Cervical Spine Wo Contrast  Result Date: 09/30/2015 CLINICAL DATA:  Alcohol intoxication. EXAM: CT HEAD WITHOUT CONTRAST CT CERVICAL SPINE WITHOUT CONTRAST TECHNIQUE: Multidetector CT imaging of the head and cervical spine was performed following the standard protocol without intravenous contrast. Multiplanar CT image reconstructions of the cervical spine were also generated. COMPARISON:  Head CT - 12/15/2014 FINDINGS: CT HEAD FINDINGS Regional soft tissues appear normal. No radiopaque foreign body. No displaced calvarial fracture. Gray-white differentiation is maintained. No CT evidence of acute large territory infarct. No intraparenchymal or extra-axial mass or hemorrhage. Unchanged size a configuration of the ventricles basilar cisterns. No midline shift. There is diffuse increased attenuation of the intracranial blood pool suggestive of volume depletion. Limited visualization the paranasal sinuses and mastoid air cells is normal. No air-fluid levels. CT CERVICAL SPINE FINDINGS C1 to the superior endplate of T3 is imaged. Mildly accentuated cervical lordosis. No anterolisthesis or retrolisthesis. The bilateral facets are normally aligned. Note is made of a punctate (approximately 3 mm) ossicle between the left right C5-C6 transverse dissects, likely the sequela of remote avulsive injury. Normal atlantodental and atlantoaxial articulations. The dens is normally positioned between the lateral masses of C1. No fracture or static subluxation of cervical spine. Cervical vertebral body heights are preserved. Prevertebral soft tissues are normal. Cervical intervertebral disc space heights are preserved. Regional soft tissues appear normal. No bulky cervical lymphadenopathy on this noncontrast examination. Limited visualization of lung apices is normal. Dystrophic calcification within the right lobe of the thyroid measuring approximately 6 mm in diameter (image 90, series 7).  IMPRESSION: 1. Negative noncontrast head CT. 2. No fracture or static subluxation of the cervical spine. Electronically Signed   By: Simonne Come M.D.   On: 09/30/2015 00:15   Lovena Neighbours, MD 10/01/2015, 12:00 AM PGY-1, [facility] FPTS Intern pager: [phone], text pages welcome  Upper Level Addendum:  I have seen and evaluated this patient along with Dr. Sydnee Cabal and reviewed the above note, making necessary revisions in pink.  Erasmo Downer, MD, MPH PGY-2,  [facility] 10/01/2015 6:43 AM

## 2015-10-01 NOTE — Consult Note (Signed)
CARDIOLOGY CONSULT NOTE   Patient ID: [name] [name] MRN: [id number] DOB/AGE: [birth year] 50 y.o.  Admit date: 09/29/2015  Primary Physician   Jeanann Lewandowsky, MD Primary Cardiologist   New Reason for Consultation   Chest pain Requesting MD: Dr Randolm Idol  JYN:WGNFA Sada is a 50 y.o. year old male with a history of alcohol cirrhosis with varices, HTN, GERD,DM type II, alcohol abuse, thrombocytopenia w/ platelets 55-83 in the last year, and sleep apnea.   Admitted 07/25 for hypoxia and symptoms consistent with alcohol withdrawal. Pt also with chest pain and hx diastolic dysfunction, cards asked to see.   Pt is cooperative, a little sleepy. He is oriented x 3. Primary language is Spanish, an interpreter was used (by phone).  Pt has chest pain at times, sometimes multiple times in a day. It is L>R sided. The L chest pain is described as his heart. It is a pressure, up to a 5-6/10. It lasts about 10 minutes. He does not know of anything that makes it better except eating. He will not get the pain every day, but can get it multiple times in a day. His chest wall is not tender, no change with deep inspiration.   He describes DOE, and gets the chest pain when he exerts himself or climbs steps.   He also describes upper abdominal pain that goes along with the chest pain. His abdomen is tender. The chest and abdominal pain gets better with eating.   He may have had a cardiac eval at one time, but cannot remember, has never seen cardiology in [place].   Past Medical History:  Diagnosis Date  . Alcohol abuse   . Cirrhosis (HCC)   . Diabetes mellitus without complication (HCC)   . GERD (gastroesophageal reflux disease)   . H/O hiatal hernia   . Headache(784.0)   . No pertinent past medical history   . Seizures (HCC)   . Shortness of breath   . Sleep apnea      Past Surgical History:  Procedure Laterality Date  . APPENDECTOMY    . NO PAST SURGERIES    . RADIOLOGY WITH  ANESTHESIA  01/30/2012   Procedure: RADIOLOGY WITH ANESTHESIA;  Surgeon: Casimiro Needle T. Miles Costain, MD;  Location: [facility] OR;  Service: Radiology;  Laterality: N/A;    No Known Allergies  I have reviewed the patient's current medications . antiseptic oral rinse  7 mL Mouth Rinse BID  . carvedilol  3.125 mg Oral BID WC  . famotidine (PEPCID) IV  20 mg Intravenous Q12H  . folic acid  1 mg Oral Daily  . insulin aspart  0-15 Units Subcutaneous TID WC  . insulin aspart  0-5 Units Subcutaneous QHS  . lactulose  20 g Oral BID  . multivitamin with minerals  1 tablet Oral Daily  . sodium chloride flush  3 mL Intravenous Q12H  . thiamine  100 mg Oral Daily   Or  . thiamine  100 mg Intravenous Daily   . sodium chloride 75 mL/hr at 09/30/15 2015   acetaminophen, LORazepam **OR** LORazepam  Prior to Admission medications   Medication Sig Start Date End Date Taking? Authorizing Provider  carvedilol (COREG) 3.125 MG tablet Take 1 tablet (3.125 mg total) by mouth 2 (two) times daily with a meal. 03/08/15   Gilda Crease, MD  chlordiazePOXIDE (LIBRIUM) 25 MG capsule  PO TID x 1D, then 25-50mg  PO BID X 1D, then 25-50mg  PO QD X 1D 03/08/15   Cristal Deer  Nedra Hai, MD  furosemide (LASIX) 40 MG tablet Take 1 tablet (40 mg total) by mouth daily. 03/08/15   Gilda Crease, MD  lactulose (CHRONULAC) 10 GM/15ML solution Take 30 mLs (20 g total) by mouth 2 (two) times daily. 03/08/15   Gilda Crease, MD  metFORMIN (GLUCOPHAGE) 500 MG tablet Take 1 tablet (500 mg total) by mouth 2 (two) times daily with a meal. 03/08/15   Gilda Crease, MD  omeprazole (PRILOSEC) 20 MG capsule Take 1 capsule (20 mg total) by mouth daily. 03/08/15   Gilda Crease, MD  ondansetron (ZOFRAN) 4 MG tablet Take 1 tablet (4 mg total) by mouth every 6 (six) hours. Patient not taking: Reported on 03/08/2015 12/04/14   Elpidio Anis, PA-C  potassium chloride (K-DUR) 10 MEQ tablet Take 1 tablet (10 mEq total)  by mouth daily. 03/08/15   Gilda Crease, MD  spironolactone (ALDACTONE) 25 MG tablet Take 1 tablet (25 mg total) by mouth daily. 03/08/15   Gilda Crease, MD     Social History   Social History  . Marital status: Single    Spouse name: N/A  . Number of children: N/A  . Years of education: N/A   Occupational History  . umemployed    Social History Main Topics  . Smoking status: Former Smoker    Packs/day: 0.25    Years: 10.00    Types: Cigarettes    Quit date: 09/30/2007  . Smokeless tobacco: Never Used  . Alcohol use Yes     Comment: 10/01/2014  "  i USUALLY DRINK A 12 PK ON THE WEEKENDS "  . Drug use: No  . Sexual activity: Not Currently   Other Topics Concern  . Not on file   Social History Narrative   Lives alone.    Family Status  Relation Status  . Mother Deceased  . Father Deceased  . Sister Alive  . Brother Alive   Family History  Problem Relation Age of Onset  . Hypertension Mother   . Syncope episode Sister      ROS:  Full 14 point review of systems complete and found to be negative unless listed above.  Physical Exam: Blood pressure 134/67, pulse 67, temperature 98.3 F (36.8 C), resp. rate 18, height 5\' 11"  (1.803 m), weight 225 lb (102.1 kg), SpO2 96 %.  General: Well developed, well nourished, male in no acute distress Head: Eyes PERRLA, No xanthomas.   Normocephalic and atraumatic, oropharynx without edema or exudate. Dentition: poor Lungs: few rales bases Heart: HRRR S1 S2, no rub/gallop, no murmur. pulses are 2+ all 4 extrem.   Neck: No carotid bruits. No lymphadenopathy.  JVD not elevated Abdomen: Bowel sounds present, abdomen soft and tender upper quadrants without masses or hernias noted. Msk:  No spine or cva tenderness. No weakness, no joint deformities or effusions. Extremities: No clubbing or cyanosis. No edema.  Neuro: Alert and oriented X 3. No focal deficits noted. Psych:  Good affect, responds appropriately Skin:  No rashes or lesions noted.  Labs:   Lab Results  Component Value Date   WBC 2.2 (L) 10/01/2015   HGB 14.0 10/01/2015   HCT 42.0 10/01/2015   MCV 101.9 (H) 10/01/2015   PLT 32 (L) 10/01/2015    Recent Labs Lab 10/01/15 0115  NA 142  K 3.3*  CL 107  CO2 28  BUN <5*  CREATININE 0.45*  CALCIUM 8.3*  PROT 5.7*  BILITOT 3.4*  ALKPHOS 65  ALT 67*  AST 116*  GLUCOSE 116*  ALBUMIN 2.8*    Recent Labs  09/30/15 0650 09/30/15 2029 10/01/15 0115 10/01/15 0706  CKTOTAL 146  --   --   --   TROPONINI  --  <0.03 <0.03 <0.03    Recent Labs  09/30/15 0056  TROPIPOC 0.01   B Natriuretic Peptide  Date/Time Value Ref Range Status  09/29/2015 10:00 PM 18.4 0.0 - 100.0 pg/mL Final  03/08/2015 02:15 AM 17.5 0.0 - 100.0 pg/mL Final   Lab Results  Component Value Date   CHOL 189 09/30/2015   HDL 66 09/30/2015   LDLCALC 98 09/30/2015   TRIG 123 09/30/2015   TSH  Date/Time Value Ref Range Status  09/30/2015 08:29 PM 1.003 0.350 - 4.500 uIU/mL Final   Echo: 10/2012  - Left ventricle: The cavity size was at the upper limits of normal. Wall thickness was increased in a pattern of mild LVH. The estimated ejection fraction was 60%. Wall motion was normal; there were no regional wall motion abnormalities. - Right ventricle: The cavity size was mildly dilated. Systolic function was normal. - Right atrium: The atrium was mildly dilated.  ECG:  07/24 SR, inferior Q waves are old  Radiology:  Dg Chest 2 View Result Date: 10/01/2015 CLINICAL DATA:  Left-sided chest pain with shortness of breath and mid to lower back pain today. EXAM: CHEST  2 VIEW COMPARISON:  09/29/2015 FINDINGS: Patient slightly rotated to the left. Lungs are adequately inflated without consolidation or effusion. Stable cardiomegaly. Minimal calcified plaque over the aortic arch. Known biliary stent over the upper abdomen on the lateral film. Surgical clips/coils over the upper abdomen. Degenerate  change of the spine. IMPRESSION: No acute cardiopulmonary disease. Stable cardiomegaly. Aortic atherosclerosis. Electronically Signed   By: Elberta Fortis M.D.   On: 10/01/2015 10:37  Dg Chest 2 View Result Date: 09/29/2015 CLINICAL DATA:  Left-sided chest pain, ETOH intoxication EXAM: CHEST  2 VIEW COMPARISON:  03/08/2015 FINDINGS: Cardiac shadow remains enlarged but stable. The lungs are well aerated bilaterally. No sizable effusion or pneumothorax is noted. No bony abnormality is noted. IMPRESSION: No acute abnormality seen. Electronically Signed   By: Alcide Clever M.D.   On: 09/29/2015 23:15  Dg Thoracic Spine 2 View Result Date: 09/29/2015 CLINICAL DATA:  Back pain, initial encounter EXAM: THORACIC SPINE 2 VIEWS COMPARISON:  None. FINDINGS: Vertebral body height is well maintained. Mild osteophytic changes are noted. There are changes of prior TIPS creation as well as embolotherapy. No paraspinal mass lesion is seen. Pedicles are within normal limits. IMPRESSION: Degenerative change without acute abnormality. Electronically Signed   By: Alcide Clever M.D.   On: 09/29/2015 23:17  Dg Lumbar Spine Complete Result Date: 09/29/2015 CLINICAL DATA:  Low back pain and left leg pain, initial encounter EXAM: LUMBAR SPINE - COMPLETE 4+ VIEW COMPARISON:  None. FINDINGS: Five lumbar type vertebral bodies are well visualized. Vertebral body height is well maintained. Mild osteophytic changes are seen. No significant anterolisthesis is noted. No compression deformities are seen. There are changes consistent with prior TIPS creation and embolus therapy. IMPRESSION: Mild degenerative change without acute abnormality. Electronically Signed   By: Alcide Clever M.D.   On: 09/29/2015 23:16  Ct Head Wo Contrast Result Date: 09/30/2015 CLINICAL DATA:  Alcohol intoxication. EXAM: CT HEAD WITHOUT CONTRAST CT CERVICAL SPINE WITHOUT CONTRAST TECHNIQUE: Multidetector CT imaging of the head and cervical spine was performed  following the standard protocol without intravenous contrast. Multiplanar CT image reconstructions  of the cervical spine were also generated. COMPARISON:  Head CT - 12/15/2014 FINDINGS: CT HEAD FINDINGS Regional soft tissues appear normal. No radiopaque foreign body. No displaced calvarial fracture. Gray-white differentiation is maintained. No CT evidence of acute large territory infarct. No intraparenchymal or extra-axial mass or hemorrhage. Unchanged size a configuration of the ventricles basilar cisterns. No midline shift. There is diffuse increased attenuation of the intracranial blood pool suggestive of volume depletion. Limited visualization the paranasal sinuses and mastoid air cells is normal. No air-fluid levels. CT CERVICAL SPINE FINDINGS C1 to the superior endplate of T3 is imaged. Mildly accentuated cervical lordosis. No anterolisthesis or retrolisthesis. The bilateral facets are normally aligned. Note is made of a punctate (approximately 3 mm) ossicle between the left right C5-C6 transverse dissects, likely the sequela of remote avulsive injury. Normal atlantodental and atlantoaxial articulations. The dens is normally positioned between the lateral masses of C1. No fracture or static subluxation of cervical spine. Cervical vertebral body heights are preserved. Prevertebral soft tissues are normal. Cervical intervertebral disc space heights are preserved. Regional soft tissues appear normal. No bulky cervical lymphadenopathy on this noncontrast examination. Limited visualization of lung apices is normal. Dystrophic calcification within the right lobe of the thyroid measuring approximately 6 mm in diameter (image 90, series 7). IMPRESSION: 1. Negative noncontrast head CT. 2. No fracture or static subluxation of the cervical spine. Electronically Signed   By: Simonne Come M.D.   On: 09/30/2015 00:15  Ct Angio Chest Pe W/cm &/or Wo Cm Result Date: 09/30/2015 CLINICAL DATA:  50 year old male with hypoxia.   Evaluate for PE. EXAM: CT ANGIOGRAPHY CHEST WITH CONTRAST TECHNIQUE: Multidetector CT imaging of the chest was performed using the standard protocol during bolus administration of intravenous contrast. Multiplanar CT image reconstructions and MIPs were obtained to evaluate the vascular anatomy. CONTRAST:  100 cc Isovue 370 COMPARISON:  Chest radiograph dated 09/29/2015 FINDINGS: Minimal bibasilar linear atelectasis/ scarring. The lungs are otherwise clear. There is no pleural effusion or pneumothorax. The central airways are patent. The thoracic aorta appears unremarkable. The origins of the great vessels of the aortic arch are patent. No CT evidence of pulmonary embolism. There is mild prominence of the main pulmonary trunk suggestive of underlying pulmonary hypertension. Mild cardiomegaly with mild dilatation of the right cardiac chambers. No pericardial effusion. No hilar or mediastinal adenopathy. The esophagus is grossly unremarkable. There is a 7 mm calcified right thyroid nodule. There is no axillary or supraclavicular adenopathy. The chest wall soft tissues appear unremarkable. There is degenerative changes of the spine. No acute fracture. Cirrhosis. Cholecystectomy. TIPS. Coiled upper abdominal varices noted. Review of the MIP images confirms the above findings. IMPRESSION: No CT evidence of pulmonary embolism. Electronically Signed   By: Elgie Collard M.D.   On: 09/30/2015 02:56  Ct Cervical Spine Wo Contrast Result Date: 09/30/2015 CLINICAL DATA:  Alcohol intoxication. EXAM: CT HEAD WITHOUT CONTRAST CT CERVICAL SPINE WITHOUT CONTRAST TECHNIQUE: Multidetector CT imaging of the head and cervical spine was performed following the standard protocol without intravenous contrast. Multiplanar CT image reconstructions of the cervical spine were also generated. COMPARISON:  Head CT - 12/15/2014 FINDINGS: CT HEAD FINDINGS Regional soft tissues appear normal. No radiopaque foreign body. No displaced calvarial  fracture. Gray-white differentiation is maintained. No CT evidence of acute large territory infarct. No intraparenchymal or extra-axial mass or hemorrhage. Unchanged size a configuration of the ventricles basilar cisterns. No midline shift. There is diffuse increased attenuation of the intracranial blood pool suggestive of  volume depletion. Limited visualization the paranasal sinuses and mastoid air cells is normal. No air-fluid levels. CT CERVICAL SPINE FINDINGS C1 to the superior endplate of T3 is imaged. Mildly accentuated cervical lordosis. No anterolisthesis or retrolisthesis. The bilateral facets are normally aligned. Note is made of a punctate (approximately 3 mm) ossicle between the left right C5-C6 transverse dissects, likely the sequela of remote avulsive injury. Normal atlantodental and atlantoaxial articulations. The dens is normally positioned between the lateral masses of C1. No fracture or static subluxation of cervical spine. Cervical vertebral body heights are preserved. Prevertebral soft tissues are normal. Cervical intervertebral disc space heights are preserved. Regional soft tissues appear normal. No bulky cervical lymphadenopathy on this noncontrast examination. Limited visualization of lung apices is normal. Dystrophic calcification within the right lobe of the thyroid measuring approximately 6 mm in diameter (image 90, series 7). IMPRESSION: 1. Negative noncontrast head CT. 2. No fracture or static subluxation of the cervical spine. Electronically Signed   By: Simonne Come M.D.   On: 09/30/2015 00:15   ASSESSMENT AND PLAN:   The patient was seen today by Dr Rennis Golden, the patient evaluated and the data reviewed.   1. Chest pain - ez negative MI - ECG with no new changes from 02/2015 - spoke with radiologist, no coronary calcifications seen on CTA chest (PE protocol) - could do a calcium score (without the cardiac CT since he just had dye). If negative, MD advise if further eval would be  needed.  Otherwise, per IM Active Problems:   Alcohol abuse   Alcoholic cirrhosis (HCC)   Thrombocytopenia, secondary   DM type 2 (diabetes mellitus, type 2) (HCC)   Esophageal varices in alcoholic cirrhosis (HCC)   HTN (hypertension)   Hypoxemia   Alcohol intoxication (HCC)   Hypoxia   SignedTheodore Demark, PA-C 10/01/2015 1:47 PM Beeper [phone]  Co-Sign MD

## 2015-10-01 NOTE — Progress Notes (Signed)
Family Medicine Teaching Service Daily Progress Note Intern Pager: [phone]  Patient name: [name] [name] Medical record number: [id number] Date of birth: 08-16-1965 Age: 50 y.o. Gender: male  Primary Care Provider: Jeanann Lewandowsky, MD Consultants: None Code Status: Full  Pt Overview and Major Events to Date:  09/30/15:  Admitted to FMTS under attending Fletke  Assessment and Plan: [name] [name] is a 50 y.o. male with a past medical history significant for alcohol cirrhosis with varices, HTN, GERD,DM type II and alcohol abuse and sleep apnea who admitted for hypoxia after treatment for symptoms consistent with alcohol withdrawal.   #Hypoxia, acute Patient was being treated in the ER for alcohol withdrawal after multiple falls yesterday while patient was intoxicated. Patient received some ativan which depressed his respiratory drive and led to some hypoxia. Patient was placed on 6L [place] and was satting in low 90's, patient was move to NRB on 10L. CTA was negative for PE. Patient was doing better upon admission to the floor and was on 2L  with good saturation mid 90. CXR clear, CTA chest without PE. Could consider aspiration pneumonitis in setting of alcohol intoxication.  Patient complains of SOB this AM. Patient has elevated hemoglobin, which may represent chronic hypoxia. Need to assess whether patient is normally SOB. Does smoke cigarettes and could have underlying lung disease. Does appear to be hyperinflated on repeat CXR and there are no signs of PNA.  --Obs, tele, FMTS, attending Fletke --Monitor Vitals --Place on pulse oximetry and telemetry - wean O2 as tolerated O/N  #Multiple falls in setting of alcohol intoxication All imaging (CT head, CT C spine, XR L/T spine) negative for acute processes in ED. No other traumatic injuries noted or reported. CK normal - Monitor closely for other signs of injury - Up with assistance  #Bloody emesis possibly 2/2 varices Patient with a  history of gastric varices with TIPS procedure and coiling noted on abdominal CT. Patient report vomiting a cup of blood, however hemoglobin is 16. Will continue to monitor. Also consider esophageal tear in setting of vomiting.  CBC shows pancytopenia with a small decrease since yesterday, likely dilutional. If patient does in fact bleed we will consult GI and promptly start PPI (currently at a shortage and only reserved for active bleeds, apparently).  --Monitor for bleed --Continue home Coreg 3.125mg  BID --Consider GI consult with repeat episode --Famotidine 20 IV BID  #Thrombocytopenia Patient with long history of alcohol abuse which would explain low platelets. Will continue to monitor in the setting of recent bleed and liver cirrhosis. Platelets 65, which is near patient's baseline.  This morning 32, however patient also has lowered RBC and WBC.  Likely dilutional.   -F/u CBC  #Chest pain, acute Patient report chest pain localized and reproducible on exam. Most likely musculoskeletal in nature after multiple falls the day prior. Initial troponin and EKG unremarkable. EKG this AM showed PVCs.  In light of his falls and question of syncopal episodes and chronic alcohol abuse, we have consulted Cardiology to investigate for possible alcoholic cardiomyopathy or other cardiac issues. Ordered echo and TSH was normal.  --Will trend troponin --Place on telemetry --Am EKG - f/u cardiology recs - f/u echo  #Alcohol abuse/withdrawal Patient with history severe alcohol withdrawal with seizure, visual and tactile hallucinations, tremors and diaphoresis. Patient last drink was 24 hr ago. Patient in the window for withdrawal. Patient presented with intoxication, EtOH level 411.  CIWA scoring 5-6 --Give thiamine and folate  --cont CIWA protocol  #DM2:  Last A1c 7.2 in 10/2014. Only prescribed metformin 500mg  BID at home. Unclear if he is taking this currently -- monitor CBGs --moderate SSI - hold  home metformin - check A1c  Depression:  Patient endorses depression, not on any medication.  In the ED casually mentioned desire to harm himself. York Spaniel he was joking when asked on the floor.  Will assess for depression with PHQ9  - f/u with PHQ9  SI joint tenderness:  Patient endorses paraspinal tenderness at the SI joint bilaterally and some paraspinal tenderness in the lumbar region.  Previous MRI showed lumbar degeneration.  Difficult to assess whether patient has had fecal and urinary incontinence.  Appears to have had at least urinary issues for the past year. Negative straight leg raise.   - will reassess when patient is not actively withdrawing from alcohol  Lower extremity tenderness: Patient also has bilateral tenderness in his calves without any swelling . Negative D-dimer last night, negative CTA. - consider DVT study lower extremities.   FEN/GI: clear liquids, NS @ 65mL/hr Prophylaxis: SCD  Disposition: pending medical improvement  Subjective:  Patient complained of headache this morning and was ordered tylenol and is also short of breath.  Denies CP, palpitations, nausea vomiting or diarrhea.   Objective: Temp:  [98 F (36.7 C)-98.3 F (36.8 C)] 98.3 F (36.8 C) (07/26 0705) Pulse Rate:  [67-99] 67 (07/26 0705) Resp:  [9-21] 18 (07/26 0705) BP: (109-134)/(51-94) 134/67 (07/26 0705) SpO2:  [89 %-98 %] 96 % (07/26 0705) Physical Exam: General: middle aged discheveled gentleman resting comfortable, NAD HENT: normocephalic and atraumatic, moist mucous membranes Head: Normocephalic and atraumatic.  Eyes: Conjunctivae and EOM are normal. Pupils are equal, round, and reactive to light.  Neck: Normal range of motion. Neck supple.  Cardiovascular: RRR, s1/s2 present, no mrg  Pulmonary/Chest: CTABL, normal work of breathing Abdominal: soft, epigastric tenderness, non-distended, no guarding or rebounding tenderness Musculoskeletal: Normal range of motion. Bilateral lower leg  tenderness, particularly at calves, SI joint tenderness bilaterally Neurological: AAOx3 Skin: Skin is warm and dry.  Psychiatric: Normal mood and affect. Denies suicidal ideation.   Laboratory:  Recent Labs Lab 09/29/15 2200 10/01/15 0115  WBC 4.6 2.2*  HGB 16.0 14.0  HCT 47.1 42.0  PLT 65* 32*    Recent Labs Lab 09/29/15 2200 10/01/15 0115  NA 142 142  K 3.8 3.3*  CL 108 107  CO2 24 28  BUN <5* <5*  CREATININE 0.55* 0.45*  CALCIUM 8.3* 8.3*  PROT 6.7 5.7*  BILITOT 3.3* 3.4*  ALKPHOS 74 65  ALT 86* 67*  AST 184* 116*  GLUCOSE 118* 116*    Imaging/Diagnostic Tests: Dg Chest 2 View  Result Date: 10/01/2015 CLINICAL DATA:  Left-sided chest pain with shortness of breath and mid to lower back pain today. EXAM: CHEST  2 VIEW COMPARISON:  09/29/2015 FINDINGS: Patient slightly rotated to the left. Lungs are adequately inflated without consolidation or effusion. Stable cardiomegaly. Minimal calcified plaque over the aortic arch. Known biliary stent over the upper abdomen on the lateral film. Surgical clips/coils over the upper abdomen. Degenerate change of the spine. IMPRESSION: No acute cardiopulmonary disease. Stable cardiomegaly. Aortic atherosclerosis. Electronically Signed   By: Elberta Fortis M.D.   On: 10/01/2015 10:37  Dg Chest 2 View  Result Date: 09/29/2015 CLINICAL DATA:  Left-sided chest pain, ETOH intoxication EXAM: CHEST  2 VIEW COMPARISON:  03/08/2015 FINDINGS: Cardiac shadow remains enlarged but stable. The lungs are well aerated bilaterally. No sizable effusion or pneumothorax is  noted. No bony abnormality is noted. IMPRESSION: No acute abnormality seen. Electronically Signed   By: Alcide Clever M.D.   On: 09/29/2015 23:15  Dg Thoracic Spine 2 View  Result Date: 09/29/2015 CLINICAL DATA:  Back pain, initial encounter EXAM: THORACIC SPINE 2 VIEWS COMPARISON:  None. FINDINGS: Vertebral body height is well maintained. Mild osteophytic changes are noted. There are  changes of prior TIPS creation as well as embolotherapy. No paraspinal mass lesion is seen. Pedicles are within normal limits. IMPRESSION: Degenerative change without acute abnormality. Electronically Signed   By: Alcide Clever M.D.   On: 09/29/2015 23:17  Dg Lumbar Spine Complete  Result Date: 09/29/2015 CLINICAL DATA:  Low back pain and left leg pain, initial encounter EXAM: LUMBAR SPINE - COMPLETE 4+ VIEW COMPARISON:  None. FINDINGS: Five lumbar type vertebral bodies are well visualized. Vertebral body height is well maintained. Mild osteophytic changes are seen. No significant anterolisthesis is noted. No compression deformities are seen. There are changes consistent with prior TIPS creation and embolus therapy. IMPRESSION: Mild degenerative change without acute abnormality. Electronically Signed   By: Alcide Clever M.D.   On: 09/29/2015 23:16  Ct Head Wo Contrast  Result Date: 09/30/2015 CLINICAL DATA:  Alcohol intoxication. EXAM: CT HEAD WITHOUT CONTRAST CT CERVICAL SPINE WITHOUT CONTRAST TECHNIQUE: Multidetector CT imaging of the head and cervical spine was performed following the standard protocol without intravenous contrast. Multiplanar CT image reconstructions of the cervical spine were also generated. COMPARISON:  Head CT - 12/15/2014 FINDINGS: CT HEAD FINDINGS Regional soft tissues appear normal. No radiopaque foreign body. No displaced calvarial fracture. Gray-white differentiation is maintained. No CT evidence of acute large territory infarct. No intraparenchymal or extra-axial mass or hemorrhage. Unchanged size a configuration of the ventricles basilar cisterns. No midline shift. There is diffuse increased attenuation of the intracranial blood pool suggestive of volume depletion. Limited visualization the paranasal sinuses and mastoid air cells is normal. No air-fluid levels. CT CERVICAL SPINE FINDINGS C1 to the superior endplate of T3 is imaged. Mildly accentuated cervical lordosis. No  anterolisthesis or retrolisthesis. The bilateral facets are normally aligned. Note is made of a punctate (approximately 3 mm) ossicle between the left right C5-C6 transverse dissects, likely the sequela of remote avulsive injury. Normal atlantodental and atlantoaxial articulations. The dens is normally positioned between the lateral masses of C1. No fracture or static subluxation of cervical spine. Cervical vertebral body heights are preserved. Prevertebral soft tissues are normal. Cervical intervertebral disc space heights are preserved. Regional soft tissues appear normal. No bulky cervical lymphadenopathy on this noncontrast examination. Limited visualization of lung apices is normal. Dystrophic calcification within the right lobe of the thyroid measuring approximately 6 mm in diameter (image 90, series 7). IMPRESSION: 1. Negative noncontrast head CT. 2. No fracture or static subluxation of the cervical spine. Electronically Signed   By: Simonne Come M.D.   On: 09/30/2015 00:15  Ct Angio Chest Pe W/cm &/or Wo Cm  Result Date: 09/30/2015 CLINICAL DATA:  50 year old male with hypoxia.  Evaluate for PE. EXAM: CT ANGIOGRAPHY CHEST WITH CONTRAST TECHNIQUE: Multidetector CT imaging of the chest was performed using the standard protocol during bolus administration of intravenous contrast. Multiplanar CT image reconstructions and MIPs were obtained to evaluate the vascular anatomy. CONTRAST:  100 cc Isovue 370 COMPARISON:  Chest radiograph dated 09/29/2015 FINDINGS: Minimal bibasilar linear atelectasis/ scarring. The lungs are otherwise clear. There is no pleural effusion or pneumothorax. The central airways are patent. The thoracic aorta appears unremarkable. The  origins of the great vessels of the aortic arch are patent. No CT evidence of pulmonary embolism. There is mild prominence of the main pulmonary trunk suggestive of underlying pulmonary hypertension. Mild cardiomegaly with mild dilatation of the right  cardiac chambers. No pericardial effusion. No hilar or mediastinal adenopathy. The esophagus is grossly unremarkable. There is a 7 mm calcified right thyroid nodule. There is no axillary or supraclavicular adenopathy. The chest wall soft tissues appear unremarkable. There is degenerative changes of the spine. No acute fracture. Cirrhosis. Cholecystectomy. TIPS. Coiled upper abdominal varices noted. Review of the MIP images confirms the above findings. IMPRESSION: No CT evidence of pulmonary embolism. Electronically Signed   By: Elgie Collard M.D.   On: 09/30/2015 02:56  Ct Cervical Spine Wo Contrast  Result Date: 09/30/2015 CLINICAL DATA:  Alcohol intoxication. EXAM: CT HEAD WITHOUT CONTRAST CT CERVICAL SPINE WITHOUT CONTRAST TECHNIQUE: Multidetector CT imaging of the head and cervical spine was performed following the standard protocol without intravenous contrast. Multiplanar CT image reconstructions of the cervical spine were also generated. COMPARISON:  Head CT - 12/15/2014 FINDINGS: CT HEAD FINDINGS Regional soft tissues appear normal. No radiopaque foreign body. No displaced calvarial fracture. Gray-white differentiation is maintained. No CT evidence of acute large territory infarct. No intraparenchymal or extra-axial mass or hemorrhage. Unchanged size a configuration of the ventricles basilar cisterns. No midline shift. There is diffuse increased attenuation of the intracranial blood pool suggestive of volume depletion. Limited visualization the paranasal sinuses and mastoid air cells is normal. No air-fluid levels. CT CERVICAL SPINE FINDINGS C1 to the superior endplate of T3 is imaged. Mildly accentuated cervical lordosis. No anterolisthesis or retrolisthesis. The bilateral facets are normally aligned. Note is made of a punctate (approximately 3 mm) ossicle between the left right C5-C6 transverse dissects, likely the sequela of remote avulsive injury. Normal atlantodental and atlantoaxial  articulations. The dens is normally positioned between the lateral masses of C1. No fracture or static subluxation of cervical spine. Cervical vertebral body heights are preserved. Prevertebral soft tissues are normal. Cervical intervertebral disc space heights are preserved. Regional soft tissues appear normal. No bulky cervical lymphadenopathy on this noncontrast examination. Limited visualization of lung apices is normal. Dystrophic calcification within the right lobe of the thyroid measuring approximately 6 mm in diameter (image 90, series 7). IMPRESSION: 1. Negative noncontrast head CT. 2. No fracture or static subluxation of the cervical spine. Electronically Signed   By: Simonne Come M.D.   On: 09/30/2015 00:15   Renne Musca, MD 10/01/2015, 12:57 PM PGY-1, [facility] Family Medicine FPTS Intern pager: [phone], text pages welcome

## 2015-10-02 ENCOUNTER — Inpatient Hospital Stay (HOSPITAL_COMMUNITY): Payer: Self-pay

## 2015-10-02 DIAGNOSIS — F10129 Alcohol abuse with intoxication, unspecified: Secondary | ICD-10-CM

## 2015-10-02 DIAGNOSIS — I10 Essential (primary) hypertension: Secondary | ICD-10-CM

## 2015-10-02 DIAGNOSIS — R079 Chest pain, unspecified: Secondary | ICD-10-CM

## 2015-10-02 DIAGNOSIS — R0902 Hypoxemia: Secondary | ICD-10-CM

## 2015-10-02 DIAGNOSIS — R0789 Other chest pain: Secondary | ICD-10-CM

## 2015-10-02 DIAGNOSIS — E119 Type 2 diabetes mellitus without complications: Secondary | ICD-10-CM

## 2015-10-02 LAB — NM MYOCAR MULTI W/SPECT W/WALL MOTION / EF
CHL CUP MPHR: 169 {beats}/min
CHL CUP STRESS STAGE 1 DBP: 78 mmHg
CHL CUP STRESS STAGE 1 SBP: 151 mmHg
CHL CUP STRESS STAGE 2 GRADE: 0 %
CHL CUP STRESS STAGE 3 HR: 77 {beats}/min
CHL CUP STRESS STAGE 4 SBP: 137 mmHg
CHL CUP STRESS STAGE 4 SPEED: 0 mph
CSEPEW: 1 METS
CSEPPBP: 137 mmHg
CSEPPHR: 73 {beats}/min
Exercise duration (min): 5 min
Exercise duration (sec): 30 s
LHR: 0.31
LVDIAVOL: 183 mL (ref 62–150)
LVSYSVOL: 82 mL
NUC STRESS TID: 1.38
Percent HR: 50 %
Percent of predicted max HR: 43 %
Rest HR: 57 {beats}/min
SDS: 1
SRS: 0
SSS: 1
Stage 1 Grade: 0 %
Stage 1 HR: 62 {beats}/min
Stage 1 Speed: 0 mph
Stage 2 HR: 62 {beats}/min
Stage 2 Speed: 0 mph
Stage 3 DBP: 72 mmHg
Stage 3 Grade: 0 %
Stage 3 SBP: 145 mmHg
Stage 3 Speed: 0 mph
Stage 4 DBP: 72 mmHg
Stage 4 Grade: 0 %
Stage 4 HR: 73 {beats}/min

## 2015-10-02 LAB — HEMOGLOBIN A1C
HEMOGLOBIN A1C: 6.2 % — AB (ref 4.8–5.6)
MEAN PLASMA GLUCOSE: 131 mg/dL

## 2015-10-02 LAB — GLUCOSE, CAPILLARY
GLUCOSE-CAPILLARY: 91 mg/dL (ref 65–99)
GLUCOSE-CAPILLARY: 203 mg/dL — AB (ref 65–99)
Glucose-Capillary: 210 mg/dL — ABNORMAL HIGH (ref 65–99)

## 2015-10-02 MED ORDER — TECHNETIUM TC 99M TETROFOSMIN IV KIT
30.0000 | PACK | Freq: Once | INTRAVENOUS | Status: AC | PRN
  Administered 2015-10-02: 30 via INTRAVENOUS

## 2015-10-02 MED ORDER — REGADENOSON 0.4 MG/5ML IV SOLN
0.4000 mg | Freq: Once | INTRAVENOUS | Status: AC
  Administered 2015-10-02: 0.4 mg via INTRAVENOUS
  Filled 2015-10-02: qty 5

## 2015-10-02 MED ORDER — TRAZODONE HCL 50 MG PO TABS: 50.0000 mg | ORAL_TABLET | Freq: Every day | ORAL | Status: DC

## 2015-10-02 MED ORDER — CARVEDILOL 3.125 MG PO TABS: 3.1250 mg | ORAL_TABLET | Freq: Two times a day (BID) | ORAL | 0 refills | Status: DC

## 2015-10-02 MED ORDER — PANTOPRAZOLE SODIUM 20 MG PO TBEC: 20.0000 mg | DELAYED_RELEASE_TABLET | Freq: Every day | ORAL | Status: DC

## 2015-10-02 MED ORDER — FLUOXETINE HCL 20 MG PO CAPS: 20.0000 mg | ORAL_CAPSULE | Freq: Every day | ORAL | Status: DC

## 2015-10-02 MED ORDER — LACTULOSE 10 GM/15ML PO SOLN
20.0000 g | Freq: Two times a day (BID) | ORAL | Status: DC
  Administered 2015-10-02: 20 g via ORAL

## 2015-10-02 MED ORDER — TECHNETIUM TC 99M TETROFOSMIN IV KIT
10.0000 | PACK | Freq: Once | INTRAVENOUS | Status: AC | PRN
  Administered 2015-10-02: 10 via INTRAVENOUS

## 2015-10-02 MED ORDER — REGADENOSON 0.4 MG/5ML IV SOLN
INTRAVENOUS | Status: AC
  Administered 2015-10-02: 0.4 mg via INTRAVENOUS
  Filled 2015-10-02: qty 5

## 2015-10-02 MED ORDER — LACTULOSE 10 GM/15ML PO SOLN: 20.0000 g | Freq: Two times a day (BID) | ORAL | 0 refills | Status: DC

## 2015-10-02 MED ORDER — FLUOXETINE HCL 20 MG PO CAPS: 20.0000 mg | ORAL_CAPSULE | Freq: Every day | ORAL | 0 refills | Status: DC

## 2015-10-02 NOTE — Progress Notes (Signed)
Family Medicine Teaching Service Daily Progress Note Intern Pager: [phone]  Patient name: [name] [name] Medical record number: [id number] Date of birth: 29-Dec-1965 Age: 50 y.o. Gender: male  Primary Care Provider: Jeanann Lewandowsky, MD Consultants: None Code Status: Full  Pt Overview and Major Events to Date:  09/30/15:  Admitted to FMTS under attending Fletke  Assessment and Plan: [name] [name] is a 50 y.o. male with a past medical history significant for alcohol cirrhosis with varices, HTN, GERD,DM type II and alcohol abuse and sleep apnea who admitted for hypoxia after treatment for symptoms consistent with alcohol withdrawal.   #Hypoxia, acute Patient was being treated in the ER for alcohol withdrawal after multiple falls yesterday while patient was intoxicated. Patient received some ativan which depressed his respiratory drive and led to some hypoxia. Patient was placed on 6L [place] and was satting in low 90's, patient was move to NRB on 10L. CTA was negative for PE. Patient was doing better upon admission to the floor and was on 2L [place] with good saturation mid 90. CXR clear, CTA chest without PE. Could consider aspiration pneumonitis in setting of alcohol intoxication.  Patient complains of SOB this AM, which I believe to be chronic. Patient reports SOB for about a year now.  Does appear to be hyperinflated on repeat CXR and there are no signs of PNA.  Satting 86% RA early this morning, repeat was 93% RA.  Persistent CP this AM, reproducible on exam, likely MSK. Ordered repeat EKG and ambulating pulse ox.  --Monitor Vitals - wean O2 as tolerated O/N - f/u EKG - f/u ambulating pulse ox  #Multiple falls in setting of alcohol intoxication All imaging (CT head, CT C spine, XR L/T spine) negative for acute processes in ED. No other traumatic injuries noted or reported. CK normal.  CIWA scoring 0.   - Monitor closely for other signs of injury - Up with assistance  #Bloody emesis possibly  2/2 varices Patient with a history of gastric varices with TIPS procedure and coiling noted on abdominal CT. Patient report vomiting a cup of blood, however hemoglobin is 16. Will continue to monitor. Also consider esophageal tear in setting of vomiting.  CBC shows pancytopenia with a small decrease since yesterday, likely dilutional. If patient does in fact bleed we will consult GI and promptly start PPI (currently at a shortage and only reserved for active bleeds, apparently). Appears to be stable. No emesis.  --Monitor for bleed --Continue home Coreg 3.125mg  BID --Consider GI consult with repeat episode --Famotidine 20 IV BID  #Thrombocytopenia Patient with long history of alcohol abuse which would explain low platelets. Will continue to monitor in the setting of recent bleed and liver cirrhosis. Platelets 65, which is near patient's baseline.  This morning 32, however patient also has lowered RBC and WBC.  Likely dilutional.   -F/u CBC  #Chest pain, acute Patient report chest pain localized and reproducible on exam. Most likely musculoskeletal in nature after multiple falls the day prior. Initial troponin and EKG unremarkable. EKG this AM showed PVCs.  In light of his falls and question of syncopal episodes and chronic alcohol abuse, we have consulted Cardiology to investigate for possible alcoholic cardiomyopathy or other cardiac issues. Seen by cardiology, recommended lexiscan myoview today.  Echo showed grade 1 diastolic failure.   - repeat EKG - f/u cardiology recs - f/u myoview - f/u echo  #Alcohol abuse/withdrawal Patient with history severe alcohol withdrawal with seizure, visual and tactile hallucinations, tremors and diaphoresis. Patient  last drink was 24 hr ago. Patient in the window for withdrawal. Patient presented with intoxication, EtOH level 411.  CIWA scoring 0. --Give thiamine and folate  --cont CIWA protocol  #DM2: Last A1c 7.2 in 10/2014. Only prescribed metformin  500mg  BID at home. Unclear if he is taking this currently. CBG is 106, A1C 6.2. -- monitor CBGs --moderate SSI - hold home metformin  Depression:  Patient endorses depression, not on any medication.  In the ED casually mentioned desire to harm himself. York Spaniel he was joking when asked on the floor.  Will assess for depression with PHQ9.  Patient on IVC consulted Psychiatry to lift.  - f/u psych recs  SI joint tenderness:  Patient endorses paraspinal tenderness at the SI joint bilaterally and some paraspinal tenderness in the lumbar region.  Previous MRI showed lumbar degeneration.  Difficult to assess whether patient has had fecal and urinary incontinence.  Appears to have had at least urinary issues for the past year. Negative straight leg raise.   - will reassess when patient is not actively withdrawing from alcohol  Lower extremity tenderness: Patient also has bilateral tenderness in his calves without any swelling . Negative D-dimer last night, negative CTA. Will continue to monitor vitals, patient saturation is 93% RA this AM.  - consider DVT study lower extremities.   FEN/GI: clear liquids, NS @ 38mL/hr Prophylaxis: SCD  Disposition: pending medical improvement  Subjective:  Patient complained of headache this morning and was ordered tylenol and is also short of breath.  Endorses some left sided CP without palpitations, nausea vomiting.  Has some diarrhea, but is 2/2 lactulose.   Objective: Temp:  [97.7 F (36.5 C)-98.9 F (37.2 C)] 97.8 F (36.6 C) (07/27 0947) Pulse Rate:  [58-81] 75 (07/27 1135) Resp:  [17-20] 18 (07/27 0947) BP: (132-150)/(68-78) 137/72 (07/27 1135) SpO2:  [86 %-96 %] 93 % (07/27 1237) Physical Exam: General: middle aged discheveled gentleman resting comfortable, NAD HENT: normocephalic and atraumatic, moist mucous membranes Head: Normocephalic and atraumatic.  Eyes: Conjunctivae and EOM are normal. Pupils are equal, round, and reactive to light.  Neck:  Normal range of motion. Neck supple.  Cardiovascular: RRR, s1/s2 present, no mrg  Pulmonary/Chest: CTABL, normal work of breathing Abdominal: soft, epigastric tenderness, non-distended, no guarding or rebounding tenderness Musculoskeletal: Normal range of motion. Bilateral lower leg tenderness, particularly at calves, SI joint tenderness bilaterally Neurological: AAOx3 Skin: Skin is warm and dry.  Psychiatric: Normal mood and affect. Denies suicidal ideation.   Laboratory:  Recent Labs Lab 09/29/15 2200 10/01/15 0115  WBC 4.6 2.2*  HGB 16.0 14.0  HCT 47.1 42.0  PLT 65* 32*    Recent Labs Lab 09/29/15 2200 10/01/15 0115  NA 142 142  K 3.8 3.3*  CL 108 107  CO2 24 28  BUN <5* <5*  CREATININE 0.55* 0.45*  CALCIUM 8.3* 8.3*  PROT 6.7 5.7*  BILITOT 3.3* 3.4*  ALKPHOS 74 65  ALT 86* 67*  AST 184* 116*  GLUCOSE 118* 116*    Imaging/Diagnostic Tests: Dg Chest 2 View  Result Date: 10/01/2015 CLINICAL DATA:  Left-sided chest pain with shortness of breath and mid to lower back pain today. EXAM: CHEST  2 VIEW COMPARISON:  09/29/2015 FINDINGS: Patient slightly rotated to the left. Lungs are adequately inflated without consolidation or effusion. Stable cardiomegaly. Minimal calcified plaque over the aortic arch. Known biliary stent over the upper abdomen on the lateral film. Surgical clips/coils over the upper abdomen. Degenerate change of the spine. IMPRESSION:  No acute cardiopulmonary disease. Stable cardiomegaly. Aortic atherosclerosis. Electronically Signed   By: Elberta Fortis M.D.   On: 10/01/2015 10:37   Renne Musca, MD 10/02/2015, 1:11 PM PGY-1, [facility] Family Medicine FPTS Intern pager: [phone], text pages welcome

## 2015-10-02 NOTE — Progress Notes (Signed)
Lexiscan MV performed. 1 day study, [facility] to read.  Theodore Demark, Cordelia Poche 10/02/2015 11:42 AM Beeper [phone]

## 2015-10-02 NOTE — Progress Notes (Signed)
Reviewed stress test images and report. No ischemia. Normal LV function. Cardiology will sign-off. Call with questions.  Chrystie Nose, MD, [facility] Attending Cardiologist [facility]

## 2015-10-02 NOTE — Consult Note (Signed)
[facility] Consult   Reason for Consult:  Alcohol intoxication and depression Referring Physician:  Dr. Ree Kida Patient Identification: [name] [name] MRN:  [id number] Principal Diagnosis: <principal problem not specified> Diagnosis:   Patient Active Problem List   Diagnosis Date Noted  . Chest pain [R07.9]   . Shortness of breath [R06.02]   . Hypoxemia [R09.02] 09/30/2015  . Alcohol intoxication ([place]) [F10.129] 09/30/2015  . Hypoxia [R09.02] 09/30/2015  . Acute diastolic CHF (congestive heart failure) ([place]) [I50.31] 11/06/2014  . History of cirrhosis of liver [Z87.19]   . Pulmonary vascular congestion [R09.89] 11/05/2014  . Volume overload [E87.70] 11/05/2014  . Diastolic dysfunction with acute on chronic heart failure ([place]) [I50.33] 11/05/2014  . Abdominal pain, chronic, epigastric [R10.13, G89.29] 11/05/2014  . Diabetes ([place]) [E11.9] 07/24/2013  . HTN (hypertension) [I10] 07/24/2013  . Left arm weakness [R29.898] 06/12/2013  . S/P cholecystectomy [Z90.49] 06/12/2013  . Splenomegaly [R16.1] 06/12/2013  . Encephalopathy [G93.40] 06/11/2013  . Liver cirrhosis, alcoholic ([place]) [X38.18] 29/93/7169  . DM (diabetes mellitus) type 2, uncontrolled, with ketoacidosis ([place]) [E13.10] 11/02/2012  . Pain in joint, lower leg [M25.569] 11/02/2012  . Syncopal episodes [R55] 10/21/2012  . Abdominal pain [R10.9] 10/21/2012  . Alcohol abuse [F10.10] 01/29/2012  . Alcoholic cirrhosis ([place]) [C78.93] 01/29/2012  . 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 DM type 2 (diabetes mellitus, type 2) ([place]) [E11.9] 01/29/2012  . Esophageal varices in alcoholic cirrhosis ([place]) [Y10.17, I85.10] 01/29/2012  . History of GI bleed [Z87.19] 01/29/2012    Total Time spent with patient: 1 hour  Subjective:   [name] [name] is a 50 y.o. male patient admitted with alcohol intoxication and depression.  HPI:  [name] [name] is a 50 y.o. male, seen, chart reviewed and case discussed with  the staff RN for this face-to-face psychiatric consultation and evaluation. Patient with a past medical history significant for alcohol cirrhosis with varices, HTN, GERD,DM type II and alcohol abuse and sleep apnea who admitted for hypoxia after treatment for symptoms consistent with alcohol withdrawal. Psychiatric consultation requested for depression and possible suicidal ideation and involuntary commitment process. Patient appeared awake, alert, oriented to time place person and situation. Patient endorses drinking alcohol more than 15 years and has been lost his wife and 2 grownup children and has nobody keep in touch with him. 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 Patient is currently staying with his sister in [facility] and has been drinking excessively. Patient was previously admitted to [facility] for alcohol detox treatment but denied ever been in alcohol rehabilitation treatment. Patient endorses feeling depressed, anxious, worthless, isolated and withdrawn but denies active suicidal/homicidal ideation, intention or plans. Patient makes statements life is beautiful and I like my family, when asked more details patient stated He lost his green card, no money and has been depending on his sister who also does not have a job. Patient is asking rehabilitation treatment so that he can get back to his life eventually. Patient is also minimizes drinking alcohol and stated he drinks only 12 pack on weekends. Patient stated everybody drinks scant serous alcoholic. Patient is willing to receive rehabilitation treatment and also medication management for depression, anxiety and insomnia  Past Psychiatric History: Denied past history of acute psychiatric hospitalization or inpatient rehabilitation treatment but endorses detox treatments in the past.  Risk to Self: Is patient at risk for suicide?: Yes Risk to Others:    Prior Inpatient Therapy:  Prior Outpatient Therapy:    Past Medical History:  Past Medical History:  Diagnosis Date  . Alcohol abuse   . Cirrhosis (HCC)   . Diabetes mellitus without complication (HCC)   . GERD (gastroesophageal reflux disease)   . H/O hiatal hernia   . Headache(784.0)   . No pertinent past medical history   . Seizures (HCC)   . Shortness of breath   . Sleep apnea     Past Surgical History:  Procedure Laterality Date  . APPENDECTOMY    . NO PAST SURGERIES    . RADIOLOGY WITH ANESTHESIA  01/30/2012   Procedure: RADIOLOGY WITH ANESTHESIA;  Surgeon: Casimiro Needle T. Miles Costain, MD;  Location: [facility] OR;  Service: Radiology;  Laterality: N/A;   Family History:  Family History  Problem Relation Age of Onset  . Hypertension Mother   . Syncope episode Sister    Family Psychiatric  History: Family history of alcohol abuse is present  Social History:  History  Alcohol Use  . Yes    Comment: 10/01/2014  "  i USUALLY DRINK A 12 PK ON THE WEEKENDS "     History  Drug Use No    Social History   Social History  . Marital status: Single    Spouse name: N/A  . Number of children: N/A  . Years of education: N/A   Occupational History  . umemployed    Social History Main Topics  . Smoking status: Former Smoker    Packs/day: 0.25    Years: 10.00    Types: Cigarettes    Quit date: 09/30/2007  . Smokeless tobacco: Never Used  . Alcohol use Yes     Comment: 10/01/2014  "  i USUALLY DRINK A 12 PK ON THE WEEKENDS "  . Drug use: No  . Sexual activity: Not Currently   Other Topics Concern  . None   Social History Narrative   Lives alone.   Additional Social History:    Allergies:  No Known Allergies  Labs:  Results for orders placed or performed during the hospital encounter of 09/29/15 (from the past 48 hour(s))  TSH     Status: None   Collection Time: 09/30/15  8:29 PM  Result Value Ref Range   TSH 1.003 0.350 - 4.500 uIU/mL  Hemoglobin A1c     Status:  Abnormal   Collection Time: 09/30/15  8:29 PM  Result Value Ref Range   Hgb A1c MFr Bld 6.2 (H) 4.8 - 5.6 %    Comment: (NOTE)         Pre-diabetes: 5.7 - 6.4         Diabetes: >6.4         Glycemic control for adults with diabetes: <7.0    Mean Plasma Glucose 131 mg/dL    Comment: (NOTE) Performed At: [facility] [address] [place], [place] [id number] Mila Homer MD [id number]   Troponin I     Status: None   Collection Time: 09/30/15  8:29 PM  Result Value Ref Range   Troponin I <0.03 <0.03 ng/mL  Lipid panel     Status: None   Collection Time: 09/30/15  8:29 PM  Result Value Ref Range   Cholesterol 189 0 - 200 mg/dL   Triglycerides 819 <971 mg/dL   HDL 66 >00 mg/dL   Total CHOL/HDL Ratio 2.9 RATIO   VLDL 25 0 - 40 mg/dL   LDL Cholesterol 98 0 - 99 mg/dL    Comment:  Total Cholesterol/HDL:CHD Risk Coronary Heart Disease Risk Table                     Men   Women  1/2 Average Risk   3.4   3.3  Average Risk       5.0   4.4  2 X Average Risk   9.6   7.1  3 X Average Risk  23.4   11.0        Use the calculated Patient Ratio above and the CHD Risk Table to determine the patient's CHD Risk.        ATP III CLASSIFICATION (LDL):  <100     mg/dL   Optimal  100-129  mg/dL   Near or Above                    Optimal  130-159  mg/dL   Borderline  160-189  mg/dL   High  >190     mg/dL   Very High   Glucose, capillary     Status: Abnormal   Collection Time: 09/30/15  8:32 PM  Result Value Ref Range   Glucose-Capillary 137 (H) 65 - 99 mg/dL  Comprehensive metabolic panel     Status: Abnormal   Collection Time: 10/01/15  1:15 AM  Result Value Ref Range   Sodium 142 135 - 145 mmol/L   Potassium 3.3 (L) 3.5 - 5.1 mmol/L   Chloride 107 101 - 111 mmol/L   CO2 28 22 - 32 mmol/L   Glucose, Bld 116 (H) 65 - 99 mg/dL   BUN <5 (L) 6 - 20 mg/dL   Creatinine, Ser 0.45 (L) 0.61 - 1.24 mg/dL   Calcium 8.3 (L) 8.9 - 10.3 mg/dL   Total Protein 5.7 (L) 6.5 -  8.1 g/dL   Albumin 2.8 (L) 3.5 - 5.0 g/dL   AST 116 (H) 15 - 41 U/L   ALT 67 (H) 17 - 63 U/L   Alkaline Phosphatase 65 38 - 126 U/L   Total Bilirubin 3.4 (H) 0.3 - 1.2 mg/dL   GFR calc non Af Amer >60 >60 mL/min   GFR calc Af Amer >60 >60 mL/min    Comment: (NOTE) The eGFR has been calculated using the CKD EPI equation. This calculation has not been validated in all clinical situations. eGFR's persistently <60 mL/min signify possible Chronic Kidney Disease.    Anion gap 7 5 - 15  CBC     Status: Abnormal   Collection Time: 10/01/15  1:15 AM  Result Value Ref Range   WBC 2.2 (L) 4.0 - 10.5 K/uL   RBC 4.12 (L) 4.22 - 5.81 MIL/uL   Hemoglobin 14.0 13.0 - 17.0 g/dL   HCT 42.0 39.0 - 52.0 %   MCV 101.9 (H) 78.0 - 100.0 fL   MCH 34.0 26.0 - 34.0 pg   MCHC 33.3 30.0 - 36.0 g/dL   RDW 12.6 11.5 - 15.5 %   Platelets 32 (L) 150 - 400 K/uL    Comment: CONSISTENT WITH PREVIOUS RESULT  Troponin I     Status: None   Collection Time: 10/01/15  1:15 AM  Result Value Ref Range   Troponin I <0.03 <0.03 ng/mL  Glucose, capillary     Status: Abnormal   Collection Time: 10/01/15  1:56 AM  Result Value Ref Range   Glucose-Capillary 128 (H) 65 - 99 mg/dL  Troponin I     Status: None   Collection Time: 10/01/15  7:06 AM  Result Value Ref Range   Troponin I <0.03 <0.03 ng/mL  Glucose, capillary     Status: Abnormal   Collection Time: 10/01/15  8:07 AM  Result Value Ref Range   Glucose-Capillary 128 (H) 65 - 99 mg/dL  Glucose, capillary     Status: Abnormal   Collection Time: 10/01/15 12:21 PM  Result Value Ref Range   Glucose-Capillary 121 (H) 65 - 99 mg/dL  Glucose, capillary     Status: Abnormal   Collection Time: 10/01/15  4:03 PM  Result Value Ref Range   Glucose-Capillary 207 (H) 65 - 99 mg/dL  Glucose, capillary     Status: Abnormal   Collection Time: 10/01/15  9:32 PM  Result Value Ref Range   Glucose-Capillary 106 (H) 65 - 99 mg/dL  Glucose, capillary     Status: None    Collection Time: 10/02/15  7:58 AM  Result Value Ref Range   Glucose-Capillary 91 65 - 99 mg/dL  Glucose, capillary     Status: Abnormal   Collection Time: 10/02/15  1:05 PM  Result Value Ref Range   Glucose-Capillary 210 (H) 65 - 99 mg/dL    Current Facility-Administered Medications  Medication Dose Route Frequency Provider Last Rate Last Dose  . 0.9 %  sodium chloride infusion   Intravenous Continuous Virginia Crews, MD 75 mL/hr at 10/01/15 2138    . acetaminophen (TYLENOL) tablet 650 mg  650 mg Oral Q6H PRN Eloise Levels, MD   650 mg at 10/02/15 0848  . antiseptic oral rinse (CPC / CETYLPYRIDINIUM CHLORIDE 0.05%) solution 7 mL  7 mL Mouth Rinse BID Lupita Dawn, MD   7 mL at 10/02/15 1000  . carvedilol (COREG) tablet 3.125 mg  3.125 mg Oral BID WC Virginia Crews, MD   3.125 mg at 10/02/15 0848  . famotidine (PEPCID) IVPB 20 mg premix  20 mg Intravenous Q12H Virginia Crews, MD   20 mg at 10/02/15 1320  . FLUoxetine (PROZAC) capsule 20 mg  20 mg Oral Daily Ambrose Finland, MD      . folic acid (FOLVITE) tablet 1 mg  1 mg Oral Daily Virginia Crews, MD   1 mg at 10/02/15 0839  . insulin aspart (novoLOG) injection 0-15 Units  0-15 Units Subcutaneous TID WC Virginia Crews, MD   5 Units at 10/02/15 1314  . insulin aspart (novoLOG) injection 0-5 Units  0-5 Units Subcutaneous QHS Virginia Crews, MD      . lactulose (CHRONULAC) 10 GM/15ML solution 20 g  20 g Oral BID Verner Mould, MD   20 g at 10/02/15 1253  . LORazepam (ATIVAN) tablet 1 mg  1 mg Oral Q6H PRN Virginia Crews, MD       Or  . LORazepam (ATIVAN) injection 1 mg  1 mg Intravenous Q6H PRN Virginia Crews, MD      . multivitamin with minerals tablet 1 tablet  1 tablet Oral Daily Virginia Crews, MD   1 tablet at 10/02/15 0840  . sodium chloride flush (NS) 0.9 % injection 3 mL  3 mL Intravenous Q12H Virginia Crews, MD   3 mL at 10/02/15 0842  . thiamine (VITAMIN B-1)  tablet 100 mg  100 mg Oral Daily Virginia Crews, MD   100 mg at 10/02/15 0840   Or  . thiamine (B-1) injection 100 mg  100 mg Intravenous Daily Virginia Crews, MD      .  traZODone (DESYREL) tablet 50 mg  50 mg Oral QHS Ambrose Finland, MD        Musculoskeletal: Strength & Muscle Tone: decreased Gait & Station: unable to stand Patient leans: N/A  Psychiatric Specialty Exam: Physical Exam  Constitutional: He is oriented to person, place, and time. He appears well-developed.  HENT:  Head: Normocephalic.  Eyes: Pupils are equal, round, and reactive to light.  Neck: Normal range of motion.  Cardiovascular: Normal rate.   Respiratory: Effort normal.  GI: Soft.  Musculoskeletal: Normal range of motion.  Neurological: He is alert and oriented to person, place, and time.  Skin: Skin is warm.  Psychiatric: His speech is normal. Thought content normal. His mood appears anxious. He is withdrawn. Cognition and memory are normal. He expresses impulsivity and inappropriate judgment. He exhibits a depressed mood.    ROS patient endorses chronic alcohol abuse versus intoxication, depression, withdrawal symptoms and shaking, sweating, disturbed sleep but denied shortness of breath or chest pain.  No Fever-chills, No Headache, No changes with Vision or hearing, reports vertigo No problems swallowing food or Liquids, No Chest pain, Cough or Shortness of Breath, No Abdominal pain, No Nausea or Vommitting, Bowel movements are regular, No Blood in stool or Urine, No dysuria, No new skin rashes or bruises, No new joints pains-aches,  No new weakness, tingling, numbness in any extremity, No recent weight gain or loss, No polyuria, polydypsia or polyphagia,   A full 10 point Review of Systems was done, except as stated above, all other Review of Systems were negative.  Blood pressure 137/72, pulse 75, temperature 97.8 F (36.6 C), temperature source Oral, resp. rate 18, height  '5\' 11"'$  (1.803 m), weight 102.1 kg (225 lb), SpO2 93 %.Body mass index is 31.38 kg/m.  General Appearance: Disheveled and Guarded  Eye Contact:  Good  Speech:  Clear and Coherent and he is sarcastic   Volume:  Decreased  Mood:  Anxious, Dysphoric and Worthless  Affect:  Appropriate and Constricted  Thought Process:  Coherent and Goal Directed  Orientation:  Full (Time, Place, and Person)  Thought Content:  Rumination  Suicidal Thoughts:  No  Homicidal Thoughts:  No  Memory:  Immediate;   Good Recent;   Fair Remote;   Fair  Judgement:  Impaired  Insight:  Fair  Psychomotor Activity:  Decreased  Concentration:  Concentration: Fair and Attention Span: Fair  Recall:  Good  Fund of Knowledge:  Good  Language:  Good  Akathisia:  Negative  Handed:  Right  AIMS (if indicated):     Assets:  Communication Skills Desire for Improvement Financial Resources/Insurance Housing Leisure Time Resilience Social Support  ADL's:  Impaired  Cognition:  WNL  Sleep:        Treatment Plan Summary: Patient has been suffering with multiple medical problems and also chronic alcohol dependence and recent relapse. Patient has symptoms of depression which seems to be chronic along with insomnia. Patient denies active suicidal/homicidal ideation, intention or plans. Patient is willing to participate in alcohol detox treatment and also rehabilitation treatment once detox is completed.   Daily contact with patient to assess and evaluate symptoms and progress in treatment and Medication management   Safety concerns: Patient denies active suicidal/homicidal ideation and contracts for safety while in the hospital.  Discontinue involuntary commitment by rescending it and also discontinue safety sitter  Alcohol withdrawal symptoms: Continue Ativan detox treatment, supportive therapy and CIWA the protocol  Depression: Fluoxetine 20 mg daily  Insomnia: We provide  trazodone 50 mg at bedtime  Psychosocial  stresses: Refer to the unit social service regarding psychosocial support and also substance abuse rehabilitation treatment placement when medically stable.  Appreciate psychiatric consultation and follow up as clinically required Please contact [phone] or [phone] if needs further assistance  Disposition: No evidence of imminent risk to self or others at present.   Patient does not meet criteria for psychiatric inpatient admission. Supportive therapy provided about ongoing stressors.  Ambrose Finland, MD 10/02/2015 3:36 PM

## 2015-10-02 NOTE — Discharge Instructions (Signed)
Please take the following medications: - Protonix once a day - Continue metformin as you were taking before admission - Fluoxetine (Prozac) once a day - Lactulose twice a day - Carvedilol (Coreg) twice a day  It is important to schedule an appointment with your regular doctor within a week.

## 2015-10-02 NOTE — Progress Notes (Signed)
CSW provided pt with list of substance abuse programs for Spanish speakers- pt has no other questions or concerns at this time- appreciates the resources  Merlyn Lot, [facility] Clinical Social Worker [phone]

## 2015-10-02 NOTE — Progress Notes (Signed)
Patient ambulated with staff. O2 at rest on room air sat was 94%. Ambulating stayed between 94-95% room air.

## 2015-10-02 NOTE — Progress Notes (Signed)
DAILY PROGRESS NOTE  Subjective:  No chest pain this morning. EKG reviewed, shows sinus without ischemic changes. Echo shows normal LV function, mild diastolic dysfunction and elevated PA pressure >50 mmHg.  Objective:  Temp:  [97.7 F (36.5 C)-98.9 F (37.2 C)] 97.8 F (36.6 C) (07/27 0947) Pulse Rate:  [58-65] 65 (07/27 0947) Resp:  [17-20] 18 (07/27 0947) BP: (132-147)/(68-76) 135/75 (07/27 0947) SpO2:  [86 %-96 %] 96 % (07/27 0947) Weight change:   Intake/Output from previous day: 07/26 0701 - 07/27 0700 In: 1884 [P.O.:600; I.V.:2570; IV Piggyback:150] Out: -   Intake/Output from this shift: No intake/output data recorded.  Medications: Current Facility-Administered Medications  Medication Dose Route Frequency Provider Last Rate Last Dose  . 0.9 %  sodium chloride infusion   Intravenous Continuous Virginia Crews, MD 75 mL/hr at 10/01/15 2138    . acetaminophen (TYLENOL) tablet 650 mg  650 mg Oral Q6H PRN Eloise Levels, MD   650 mg at 10/02/15 0848  . antiseptic oral rinse (CPC / CETYLPYRIDINIUM CHLORIDE 0.05%) solution 7 mL  7 mL Mouth Rinse BID Lupita Dawn, MD   7 mL at 10/01/15 2139  . carvedilol (COREG) tablet 3.125 mg  3.125 mg Oral BID WC Virginia Crews, MD   3.125 mg at 10/02/15 0848  . famotidine (PEPCID) IVPB 20 mg premix  20 mg Intravenous Q12H Virginia Crews, MD   20 mg at 10/01/15 2138  . folic acid (FOLVITE) tablet 1 mg  1 mg Oral Daily Virginia Crews, MD   1 mg at 10/02/15 0839  . insulin aspart (novoLOG) injection 0-15 Units  0-15 Units Subcutaneous TID WC Virginia Crews, MD   5 Units at 10/01/15 1808  . insulin aspart (novoLOG) injection 0-5 Units  0-5 Units Subcutaneous QHS Virginia Crews, MD      . LORazepam (ATIVAN) tablet 1 mg  1 mg Oral Q6H PRN Virginia Crews, MD       Or  . LORazepam (ATIVAN) injection 1 mg  1 mg Intravenous Q6H PRN Virginia Crews, MD      . multivitamin with minerals tablet 1 tablet  1  tablet Oral Daily Virginia Crews, MD   1 tablet at 10/02/15 0840  . regadenoson (LEXISCAN) 0.4 MG/5ML injection SOLN           . regadenoson (LEXISCAN) injection SOLN 0.4 mg  0.4 mg Intravenous Once Rhonda G Barrett, PA-C      . sodium chloride flush (NS) 0.9 % injection 3 mL  3 mL Intravenous Q12H Virginia Crews, MD   3 mL at 10/02/15 0842  . thiamine (VITAMIN B-1) tablet 100 mg  100 mg Oral Daily Virginia Crews, MD   100 mg at 10/02/15 0840   Or  . thiamine (B-1) injection 100 mg  100 mg Intravenous Daily Virginia Crews, MD        Physical Exam: General appearance: alert and no distress Lungs: clear to auscultation bilaterally Heart: regular rate and rhythm, S1, S2 normal, no murmur, click, rub or gallop Extremities: extremities normal, atraumatic, no cyanosis or edema Neurologic: Grossly normal  Lab Results: Results for orders placed or performed during the hospital encounter of 09/29/15 (from the past 48 hour(s))  TSH     Status: None   Collection Time: 09/30/15  8:29 PM  Result Value Ref Range   TSH 1.003 0.350 - 4.500 uIU/mL  Hemoglobin A1c     Status:  Abnormal   Collection Time: 09/30/15  8:29 PM  Result Value Ref Range   Hgb A1c MFr Bld 6.2 (H) 4.8 - 5.6 %    Comment: (NOTE)         Pre-diabetes: 5.7 - 6.4         Diabetes: >6.4         Glycemic control for adults with diabetes: <7.0    Mean Plasma Glucose 131 mg/dL    Comment: (NOTE) Performed At: [facility] [place], [place] [id number] Lindon Romp MD NI:[id number]   Troponin I     Status: None   Collection Time: 09/30/15  8:29 PM  Result Value Ref Range   Troponin I <0.03 <0.03 ng/mL  Lipid panel     Status: None   Collection Time: 09/30/15  8:29 PM  Result Value Ref Range   Cholesterol 189 0 - 200 mg/dL   Triglycerides 123 <150 mg/dL   HDL 66 >40 mg/dL   Total CHOL/HDL Ratio 2.9 RATIO   VLDL 25 0 - 40 mg/dL   LDL Cholesterol 98 0 - 99 mg/dL    Comment:         Total Cholesterol/HDL:CHD Risk Coronary Heart Disease Risk Table                     Men   Women  1/2 Average Risk   3.4   3.3  Average Risk       5.0   4.4  2 X Average Risk   9.6   7.1  3 X Average Risk  23.4   11.0        Use the calculated Patient Ratio above and the CHD Risk Table to determine the patient's CHD Risk.        ATP III CLASSIFICATION (LDL):  <100     mg/dL   Optimal  100-129  mg/dL   Near or Above                    Optimal  130-159  mg/dL   Borderline  160-189  mg/dL   High  >190     mg/dL   Very High   Glucose, capillary     Status: Abnormal   Collection Time: 09/30/15  8:32 PM  Result Value Ref Range   Glucose-Capillary 137 (H) 65 - 99 mg/dL  Comprehensive metabolic panel     Status: Abnormal   Collection Time: 10/01/15  1:15 AM  Result Value Ref Range   Sodium 142 135 - 145 mmol/L   Potassium 3.3 (L) 3.5 - 5.1 mmol/L   Chloride 107 101 - 111 mmol/L   CO2 28 22 - 32 mmol/L   Glucose, Bld 116 (H) 65 - 99 mg/dL   BUN <5 (L) 6 - 20 mg/dL   Creatinine, Ser 0.45 (L) 0.61 - 1.24 mg/dL   Calcium 8.3 (L) 8.9 - 10.3 mg/dL   Total Protein 5.7 (L) 6.5 - 8.1 g/dL   Albumin 2.8 (L) 3.5 - 5.0 g/dL   AST 116 (H) 15 - 41 U/L   ALT 67 (H) 17 - 63 U/L   Alkaline Phosphatase 65 38 - 126 U/L   Total Bilirubin 3.4 (H) 0.3 - 1.2 mg/dL   GFR calc non Af Amer >60 >60 mL/min   GFR calc Af Amer >60 >60 mL/min    Comment: (NOTE) The eGFR has been calculated using the CKD EPI equation. This calculation has not  been validated in all clinical situations. eGFR's persistently <60 mL/min signify possible Chronic Kidney Disease.    Anion gap 7 5 - 15  CBC     Status: Abnormal   Collection Time: 10/01/15  1:15 AM  Result Value Ref Range   WBC 2.2 (L) 4.0 - 10.5 K/uL   RBC 4.12 (L) 4.22 - 5.81 MIL/uL   Hemoglobin 14.0 13.0 - 17.0 g/dL   HCT 42.0 39.0 - 52.0 %   MCV 101.9 (H) 78.0 - 100.0 fL   MCH 34.0 26.0 - 34.0 pg   MCHC 33.3 30.0 - 36.0 g/dL   RDW 12.6 11.5 - 15.5 %     Platelets 32 (L) 150 - 400 K/uL    Comment: CONSISTENT WITH PREVIOUS RESULT  Troponin I     Status: None   Collection Time: 10/01/15  1:15 AM  Result Value Ref Range   Troponin I <0.03 <0.03 ng/mL  Glucose, capillary     Status: Abnormal   Collection Time: 10/01/15  1:56 AM  Result Value Ref Range   Glucose-Capillary 128 (H) 65 - 99 mg/dL  Troponin I     Status: None   Collection Time: 10/01/15  7:06 AM  Result Value Ref Range   Troponin I <0.03 <0.03 ng/mL  Glucose, capillary     Status: Abnormal   Collection Time: 10/01/15  8:07 AM  Result Value Ref Range   Glucose-Capillary 128 (H) 65 - 99 mg/dL  Glucose, capillary     Status: Abnormal   Collection Time: 10/01/15 12:21 PM  Result Value Ref Range   Glucose-Capillary 121 (H) 65 - 99 mg/dL  Glucose, capillary     Status: Abnormal   Collection Time: 10/01/15  4:03 PM  Result Value Ref Range   Glucose-Capillary 207 (H) 65 - 99 mg/dL  Glucose, capillary     Status: Abnormal   Collection Time: 10/01/15  9:32 PM  Result Value Ref Range   Glucose-Capillary 106 (H) 65 - 99 mg/dL  Glucose, capillary     Status: None   Collection Time: 10/02/15  7:58 AM  Result Value Ref Range   Glucose-Capillary 91 65 - 99 mg/dL    Imaging: Dg Chest 2 View  Result Date: 10/01/2015 CLINICAL DATA:  Left-sided chest pain with shortness of breath and mid to lower back pain today. EXAM: CHEST  2 VIEW COMPARISON:  09/29/2015 FINDINGS: Patient slightly rotated to the left. Lungs are adequately inflated without consolidation or effusion. Stable cardiomegaly. Minimal calcified plaque over the aortic arch. Known biliary stent over the upper abdomen on the lateral film. Surgical clips/coils over the upper abdomen. Degenerate change of the spine. IMPRESSION: No acute cardiopulmonary disease. Stable cardiomegaly. Aortic atherosclerosis. Electronically Signed   By: Marin Olp M.D.   On: 10/01/2015 10:37   Assessment:  1. Active Problems: 2.   Alcohol  abuse 3.   Alcoholic cirrhosis ([place]) 4.   Thrombocytopenia, secondary 5.   DM type 2 (diabetes mellitus, type 2) ([place]) 6.   Esophageal varices in alcoholic cirrhosis ([place]) 7.   HTN (hypertension) 8.   Hypoxemia 9.   Alcohol intoxication ([place]) 10.   Hypoxia 11.   Chest pain 12.   Shortness of breath 13.   Plan:  1. No events overnight. Echo yesterday shows normal LV function and mild diastolic dysfunction with elevated PA pressure of 54 mmHg. There is mild to moderate biatrial enlargement. Plan for lexiscan myoview today. Will follow-up on results and provide further recommendations.  Time Spent Directly with Patient:  15 minutes  Length of Stay:  LOS: 1 day   Pixie Casino, MD, [facility] Attending Cardiologist [place] 10/02/2015, 9:50 AM

## 2015-10-04 NOTE — Discharge Summary (Signed)
Family Medicine Teaching [facility] Discharge Summary  Patient name: [name] [name] Medical record number: [id number] Date of birth: [birth year] Age: 50 y.o. Gender: male Date of Admission: 09/29/2015  Date of Discharge: 10/02/15 Admitting Physician: Moses Manners, MD  Primary Care Provider: Jeanann Lewandowsky, MD Consultants: Cardiology  Indication for Hospitalization: Hypoxia  Discharge Diagnoses/Problem List:  Patient Active Problem List   Diagnosis Date Noted  . Chest pain   . Shortness of breath   . Hypoxemia 09/30/2015  . Alcohol intoxication (HCC) 09/30/2015  . Hypoxia 09/30/2015  . Acute diastolic CHF (congestive heart failure) (HCC) 11/06/2014  . History of cirrhosis of liver   . Pulmonary vascular congestion 11/05/2014  . Volume overload 11/05/2014  . Diastolic dysfunction with acute on chronic heart failure (HCC) 11/05/2014  . Abdominal pain, chronic, epigastric 11/05/2014  . Diabetes (HCC) 07/24/2013  . HTN (hypertension) 07/24/2013  . Left arm weakness 06/12/2013  . S/P cholecystectomy 06/12/2013  . Splenomegaly 06/12/2013  . Encephalopathy 06/11/2013  . Liver cirrhosis, alcoholic (HCC) 11/02/2012  . DM (diabetes mellitus) type 2, uncontrolled, with ketoacidosis (HCC) 11/02/2012  . Pain in joint, lower leg 11/02/2012  . Syncopal episodes 10/21/2012  . Abdominal pain 10/21/2012  . Alcohol abuse 01/29/2012  . Alcoholic cirrhosis (HCC) 01/29/2012  . Thrombocytopenia, secondary 01/29/2012  . DM type 2 (diabetes mellitus, type 2) (HCC) 01/29/2012  . Esophageal varices in alcoholic cirrhosis (HCC) 01/29/2012  . History of GI bleed 01/29/2012     Disposition: Home  Discharge Condition: Stable, improved   Discharge Exam:  General: middle aged discheveled gentleman resting comfortable, NAD HENT: normocephalic and atraumatic, moist mucous membranes Head: Normocephalicand atraumatic.  Eyes: Conjunctivaeand EOMare normal. Pupils are equal, round, and  reactive to light.  Neck: Normal range of motion. Neck supple.  Cardiovascular: RRR, s1/s2 present, no mrg  Pulmonary/Chest: CTABL, normal work of breathing Abdominal: soft, epigastric tenderness, non-distended, no guarding or rebounding tenderness Musculoskeletal: Normal range of motion. Bilateral lower leg tenderness, particularly at calves, SI joint tenderness bilaterally Neurological: AAOx3 Skin: Skin is warmand dry.  Psychiatric: Normal mood and affect. Denies suicidal ideation.   Brief Hospital Course:  [name] [name] is a 50 y.o. male with a past medical history significant for alcohol cirrhosis with varices, HTN, GERD,DM type II and alcohol abuse and sleep apnea who admitted for hypoxia after treatment for symptoms consistent with alcohol withdrawal.   #Hypoxia, acute Patient presented to ED for evaluation after multiple falls while intoxicated and was given ativan and developed acute hypoxia. Patient was placed on 6L [place] and was satting in low 90's, patient was move to NRB on 10L. CTA was negative for PE. Patient was doing better upon admission to the floor and was on 2L  with good saturation mid 90. CXR clear. Considered aspiration pneumonitis in setting of alcohol intoxication. Patient did complain of SOB the morning after admission, but this was thought to be chronic 2/2 under treated COPD.  Patient further reported SOB for about a year now.  No signs of PNA. Patient complained of chest pain that same morning, reproducible on exam and felt to be MSK related. Ordered repeat EKG and ambulating pulse ox, both were wnl. Patient did not need home O2 upon discharge.    #Multiple falls in setting of alcohol intoxication All imaging (CT head, CT C spine, XR L/T spine) negative for acute processes in ED. No other traumatic injuries noted or reported. CK normal.    #Chest pain, acute Patient report chest pain localized  and reproducible on exam. Most likely musculoskeletal in nature after  multiple falls the day prior. Initial troponin and EKG unremarkable. In light of his falls and question of syncopal episodes and chronic alcohol abuse, we consulted Cardiology to investigate for possible alcoholic cardiomyopathy or other cardiac issues. Seen by cardiology who recommended lexiscan myoview which was normal.  Echo showed grade 1 diastolic failure.    #Alcohol abuse/withdrawal Patient with history severe alcohol withdrawal with seizure, visual and tactile hallucinations, tremors and diaphoresis. Patient last drink was the day prior to admission. Patient was in the window for withdrawal. Patient presented with intoxication, EtOH level 411. Patient given banana bag and placed on CIWA. Patient discharged scoring 0.   Depression:  Patient endorses depression, not on any medication.  In the ED casually mentioned desire to harm himself. York Spaniel he was joking when asked on the floor.  Patient on IVC consulted Psychiatry to lift and was ultimately lifted.   Issues for Follow Up:  1. Alcohol abuse: Patient was given list of substance abuse programs for spanish speakers  Significant Procedures: Myoview stress test  Significant Labs and Imaging:   Recent Labs Lab 09/29/15 2200 10/01/15 0115  WBC 4.6 2.2*  HGB 16.0 14.0  HCT 47.1 42.0  PLT 65* 32*    Recent Labs Lab 09/29/15 2200 10/01/15 0115  NA 142 142  K 3.8 3.3*  CL 108 107  CO2 24 28  GLUCOSE 118* 116*  BUN <5* <5*  CREATININE 0.55* 0.45*  CALCIUM 8.3* 8.3*  ALKPHOS 74 65  AST 184* 116*  ALT 86* 67*  ALBUMIN 3.5 2.8*    Results/Tests Pending at Time of Discharge: None  Discharge Medications:    Medication List    TAKE these medications   carvedilol 3.125 MG tablet Commonly known as:  COREG Take 1 tablet (3.125 mg total) by mouth 2 (two) times daily with a meal.   FLUoxetine 20 MG capsule Commonly known as:  PROZAC Take 1 capsule (20 mg total) by mouth daily.   lactulose 10 GM/15ML solution Commonly  known as:  CHRONULAC Take 30 mLs (20 g total) by mouth 2 (two) times daily.       Discharge Instructions: Please refer to Patient Instructions section of EMR for full details.  Patient was counseled important signs and symptoms that should prompt return to medical care, changes in medications, dietary instructions, activity restrictions, and follow up appointments.   Follow-Up Appointments: Follow-up Information    JEGEDE, OLUGBEMIGA, MD. Schedule an appointment as soon as possible for a visit in 1 week(s).   Specialty:  Internal Medicine Why:  For hospital follow-up.  Contact information: [address] [place] [address] [phone]           Renne Musca, MD 10/04/2015, 8:34 PM PGY-1, [facility]

## 2015-12-27 ENCOUNTER — Encounter (HOSPITAL_COMMUNITY): Payer: Self-pay | Admitting: Emergency Medicine

## 2015-12-27 ENCOUNTER — Inpatient Hospital Stay (HOSPITAL_COMMUNITY)
Admission: EM | Admit: 2015-12-27 | Discharge: 2015-12-31 | DRG: 378 | Disposition: A | Discharge status: Home Health | Payer: Self-pay | Attending: Internal Medicine | Admitting: Internal Medicine

## 2015-12-27 ENCOUNTER — Emergency Department (HOSPITAL_COMMUNITY): Payer: Self-pay

## 2015-12-27 ENCOUNTER — Observation Stay (HOSPITAL_COMMUNITY): Payer: Self-pay

## 2015-12-27 DIAGNOSIS — Z8249 Family history of ischemic heart disease and other diseases of the circulatory system: Secondary | ICD-10-CM

## 2015-12-27 DIAGNOSIS — D6959 Other secondary thrombocytopenia: Secondary | ICD-10-CM | POA: Diagnosis present

## 2015-12-27 DIAGNOSIS — E1165 Type 2 diabetes mellitus with hyperglycemia: Secondary | ICD-10-CM | POA: Diagnosis present

## 2015-12-27 DIAGNOSIS — Z87891 Personal history of nicotine dependence: Secondary | ICD-10-CM

## 2015-12-27 DIAGNOSIS — I8510 Secondary esophageal varices without bleeding: Secondary | ICD-10-CM | POA: Diagnosis present

## 2015-12-27 DIAGNOSIS — W19XXXA Unspecified fall, initial encounter: Secondary | ICD-10-CM

## 2015-12-27 DIAGNOSIS — F10231 Alcohol dependence with withdrawal delirium: Secondary | ICD-10-CM | POA: Diagnosis present

## 2015-12-27 DIAGNOSIS — R52 Pain, unspecified: Secondary | ICD-10-CM

## 2015-12-27 DIAGNOSIS — F1010 Alcohol abuse, uncomplicated: Secondary | ICD-10-CM | POA: Diagnosis present

## 2015-12-27 DIAGNOSIS — K7290 Hepatic failure, unspecified without coma: Secondary | ICD-10-CM

## 2015-12-27 DIAGNOSIS — K7030 Alcoholic cirrhosis of liver without ascites: Secondary | ICD-10-CM | POA: Diagnosis present

## 2015-12-27 DIAGNOSIS — K210 Gastro-esophageal reflux disease with esophagitis: Secondary | ICD-10-CM | POA: Diagnosis present

## 2015-12-27 DIAGNOSIS — F10129 Alcohol abuse with intoxication, unspecified: Secondary | ICD-10-CM | POA: Diagnosis present

## 2015-12-27 DIAGNOSIS — K7682 Hepatic encephalopathy: Secondary | ICD-10-CM

## 2015-12-27 DIAGNOSIS — I10 Essential (primary) hypertension: Secondary | ICD-10-CM | POA: Diagnosis present

## 2015-12-27 DIAGNOSIS — Z9119 Patient's noncompliance with other medical treatment and regimen: Secondary | ICD-10-CM

## 2015-12-27 DIAGNOSIS — K7010 Alcoholic hepatitis without ascites: Secondary | ICD-10-CM

## 2015-12-27 DIAGNOSIS — R079 Chest pain, unspecified: Secondary | ICD-10-CM | POA: Diagnosis present

## 2015-12-27 DIAGNOSIS — T380X5A Adverse effect of glucocorticoids and synthetic analogues, initial encounter: Secondary | ICD-10-CM | POA: Diagnosis present

## 2015-12-27 DIAGNOSIS — D72819 Decreased white blood cell count, unspecified: Secondary | ICD-10-CM | POA: Diagnosis present

## 2015-12-27 DIAGNOSIS — D696 Thrombocytopenia, unspecified: Secondary | ICD-10-CM

## 2015-12-27 DIAGNOSIS — E876 Hypokalemia: Secondary | ICD-10-CM | POA: Diagnosis present

## 2015-12-27 DIAGNOSIS — R040 Epistaxis: Secondary | ICD-10-CM | POA: Diagnosis present

## 2015-12-27 DIAGNOSIS — K7040 Alcoholic hepatic failure without coma: Secondary | ICD-10-CM | POA: Diagnosis present

## 2015-12-27 DIAGNOSIS — K920 Hematemesis: Principal | ICD-10-CM | POA: Diagnosis present

## 2015-12-27 LAB — I-STAT CHEM 8, ED
BUN: <3 mg/dL — ABNORMAL LOW (ref 6–20)
CALCIUM ION: 0.98 mmol/L — AB (ref 1.15–1.40)
Chloride: 102 mmol/L (ref 101–111)
Creatinine, Ser: 1 mg/dL (ref 0.61–1.24)
Glucose, Bld: 129 mg/dL — ABNORMAL HIGH (ref 65–99)
HCT: 51 % (ref 39.0–52.0)
HEMOGLOBIN: 17.3 g/dL — AB (ref 13.0–17.0)
Potassium: 3.4 mmol/L — ABNORMAL LOW (ref 3.5–5.1)
SODIUM: 144 mmol/L (ref 135–145)
TCO2: 28 mmol/L (ref 0–100)

## 2015-12-27 LAB — GLUCOSE, CAPILLARY
GLUCOSE-CAPILLARY: 129 mg/dL — AB (ref 65–99)
GLUCOSE-CAPILLARY: 137 mg/dL — AB (ref 65–99)
Glucose-Capillary: 91 mg/dL (ref 65–99)
Glucose-Capillary: 203 mg/dL — ABNORMAL HIGH (ref 65–99)

## 2015-12-27 LAB — COMPREHENSIVE METABOLIC PANEL
ALT: 127 U/L — ABNORMAL HIGH (ref 17–63)
ANION GAP: 12 (ref 5–15)
AST: 290 U/L — ABNORMAL HIGH (ref 15–41)
Albumin: 3.6 g/dL (ref 3.5–5.0)
Alkaline Phosphatase: 88 U/L (ref 38–126)
BUN: <5 mg/dL — ABNORMAL LOW (ref 6–20)
CHLORIDE: 102 mmol/L (ref 101–111)
CO2: 27 mmol/L (ref 22–32)
CREATININE: 0.46 mg/dL — AB (ref 0.61–1.24)
Calcium: 8.6 mg/dL — ABNORMAL LOW (ref 8.9–10.3)
Glucose, Bld: 132 mg/dL — ABNORMAL HIGH (ref 65–99)
POTASSIUM: 3.3 mmol/L — AB (ref 3.5–5.1)
Sodium: 141 mmol/L (ref 135–145)
Total Bilirubin: 4.4 mg/dL — ABNORMAL HIGH (ref 0.3–1.2)
Total Protein: 7.6 g/dL (ref 6.5–8.1)

## 2015-12-27 LAB — HEPATIC FUNCTION PANEL
ALBUMIN: 3.1 g/dL — AB (ref 3.5–5.0)
ALT: 112 U/L — ABNORMAL HIGH (ref 17–63)
AST: 272 U/L — ABNORMAL HIGH (ref 15–41)
Alkaline Phosphatase: 72 U/L (ref 38–126)
BILIRUBIN DIRECT: 1.9 mg/dL — AB (ref 0.1–0.5)
BILIRUBIN INDIRECT: 2.3 mg/dL — AB (ref 0.3–0.9)
BILIRUBIN TOTAL: 4.2 mg/dL — AB (ref 0.3–1.2)
Total Protein: 6.2 g/dL — ABNORMAL LOW (ref 6.5–8.1)

## 2015-12-27 LAB — BASIC METABOLIC PANEL
ANION GAP: 14 (ref 5–15)
BUN: <5 mg/dL — ABNORMAL LOW (ref 6–20)
CO2: 22 mmol/L (ref 22–32)
Calcium: 7.4 mg/dL — ABNORMAL LOW (ref 8.9–10.3)
Chloride: 108 mmol/L (ref 101–111)
Creatinine, Ser: 0.51 mg/dL — ABNORMAL LOW (ref 0.61–1.24)
GFR calc Af Amer: >60 mL/min (ref >60)
GLUCOSE: 80 mg/dL (ref 65–99)
POTASSIUM: 3.7 mmol/L (ref 3.5–5.1)
Sodium: 144 mmol/L (ref 135–145)

## 2015-12-27 LAB — TYPE AND SCREEN
ABO/RH(D): O POS
Antibody Screen: NEGATIVE

## 2015-12-27 LAB — I-STAT TROPONIN, ED: TROPONIN I, POC: 0.01 ng/mL (ref 0.00–0.08)

## 2015-12-27 LAB — TROPONIN I: Troponin I: <0.03 ng/mL (ref <0.03)

## 2015-12-27 LAB — CBC
HCT: 50.3 % (ref 39.0–52.0)
HEMATOCRIT: 45.7 % (ref 39.0–52.0)
Hemoglobin: 17.6 g/dL — ABNORMAL HIGH (ref 13.0–17.0)
Hemoglobin: 15.9 g/dL (ref 13.0–17.0)
MCH: 34.4 pg — ABNORMAL HIGH (ref 26.0–34.0)
MCH: 34.8 pg — ABNORMAL HIGH (ref 26.0–34.0)
MCHC: 35 g/dL (ref 30.0–36.0)
MCHC: 34.8 g/dL (ref 30.0–36.0)
MCV: 100 fL (ref 78.0–100.0)
MCV: 98.2 fL (ref 78.0–100.0)
PLATELETS: 24 10*3/uL — AB (ref 150–400)
PLATELETS: 30 10*3/uL — AB (ref 150–400)
RBC: 4.57 MIL/uL (ref 4.22–5.81)
RBC: 5.12 MIL/uL (ref 4.22–5.81)
RDW: 12.6 % (ref 11.5–15.5)
RDW: 12.8 % (ref 11.5–15.5)
WBC: 2.9 10*3/uL — AB (ref 4.0–10.5)
WBC: 3.4 10*3/uL — AB (ref 4.0–10.5)

## 2015-12-27 LAB — MAGNESIUM: Magnesium: 1.5 mg/dL — ABNORMAL LOW (ref 1.7–2.4)

## 2015-12-27 LAB — APTT: aPTT: 37 seconds — ABNORMAL HIGH (ref 24–36)

## 2015-12-27 LAB — ETHANOL: ALCOHOL ETHYL (B): 475 mg/dL — AB (ref <5)

## 2015-12-27 LAB — PROTIME-INR
INR: 1.23
PROTHROMBIN TIME: 15.5 s — AB (ref 11.4–15.2)

## 2015-12-27 LAB — POC OCCULT BLOOD, ED: FECAL OCCULT BLD: NEGATIVE

## 2015-12-27 MED ORDER — PANTOPRAZOLE SODIUM 40 MG IV SOLR
40.0000 mg | Freq: Two times a day (BID) | INTRAVENOUS | Status: DC
  Administered 2015-12-27 – 2015-12-30 (×7): 40 mg via INTRAVENOUS
  Filled 2015-12-27 (×7): qty 40

## 2015-12-27 MED ORDER — NITROGLYCERIN 0.4 MG SL SUBL
0.4000 mg | SUBLINGUAL_TABLET | SUBLINGUAL | Status: DC | PRN
  Administered 2015-12-27 (×2): 0.4 mg via SUBLINGUAL
  Filled 2015-12-27: qty 1

## 2015-12-27 MED ORDER — SODIUM CHLORIDE 0.9 % IV SOLN
INTRAVENOUS | Status: AC
  Administered 2015-12-27 (×2): via INTRAVENOUS

## 2015-12-27 MED ORDER — THIAMINE HCL 100 MG/ML IJ SOLN
100.0000 mg | Freq: Every day | INTRAMUSCULAR | Status: DC
  Administered 2015-12-27: 100 mg via INTRAVENOUS
  Filled 2015-12-27 (×2): qty 2

## 2015-12-27 MED ORDER — NITROGLYCERIN 0.4 MG SL SUBL
SUBLINGUAL_TABLET | SUBLINGUAL | Status: AC
  Filled 2015-12-27: qty 1

## 2015-12-27 MED ORDER — ONDANSETRON HCL 4 MG/2ML IJ SOLN
4.0000 mg | Freq: Four times a day (QID) | INTRAMUSCULAR | Status: DC | PRN
  Administered 2015-12-28: 4 mg via INTRAVENOUS
  Filled 2015-12-27: qty 2

## 2015-12-27 MED ORDER — ONDANSETRON HCL 4 MG/2ML IJ SOLN
4.0000 mg | Freq: Once | INTRAMUSCULAR | Status: AC
  Administered 2015-12-27: 4 mg via INTRAVENOUS
  Filled 2015-12-27: qty 2

## 2015-12-27 MED ORDER — SODIUM CHLORIDE 0.9 % IV SOLN
8.0000 mg/h | INTRAVENOUS | Status: DC
  Administered 2015-12-27: 8 mg/h via INTRAVENOUS
  Filled 2015-12-27 (×4): qty 80

## 2015-12-27 MED ORDER — MAGNESIUM SULFATE 2 GM/50ML IV SOLN
2.0000 g | Freq: Once | INTRAVENOUS | Status: AC
  Administered 2015-12-27: 2 g via INTRAVENOUS
  Filled 2015-12-27: qty 50

## 2015-12-27 MED ORDER — SODIUM CHLORIDE 0.9 % IV BOLUS (SEPSIS)
1000.0000 mL | Freq: Once | INTRAVENOUS | Status: AC
  Administered 2015-12-27: 1000 mL via INTRAVENOUS

## 2015-12-27 MED ORDER — PANTOPRAZOLE SODIUM 40 MG IV SOLR
80.0000 mg | Freq: Once | INTRAVENOUS | Status: AC
Start: 2015-12-27 — End: 2015-12-27
  Administered 2015-12-27: 80 mg via INTRAVENOUS
  Filled 2015-12-27: qty 80

## 2015-12-27 MED ORDER — ADULT MULTIVITAMIN W/MINERALS CH
1.0000 | ORAL_TABLET | Freq: Every day | ORAL | Status: DC
  Administered 2015-12-27 – 2015-12-31 (×5): 1 via ORAL
  Filled 2015-12-27 (×5): qty 1

## 2015-12-27 MED ORDER — LORAZEPAM 1 MG PO TABS
1.0000 mg | ORAL_TABLET | Freq: Four times a day (QID) | ORAL | Status: AC | PRN
  Administered 2015-12-28: 1 mg via ORAL
  Filled 2015-12-27 (×2): qty 1

## 2015-12-27 MED ORDER — SODIUM CHLORIDE 0.9 % IV SOLN
50.0000 ug/h | INTRAVENOUS | Status: DC
  Administered 2015-12-27: 50 ug/h via INTRAVENOUS
  Filled 2015-12-27 (×4): qty 1

## 2015-12-27 MED ORDER — VITAMIN B-1 100 MG PO TABS
100.0000 mg | ORAL_TABLET | Freq: Every day | ORAL | Status: DC
  Administered 2015-12-28 – 2015-12-31 (×4): 100 mg via ORAL
  Filled 2015-12-27 (×4): qty 1

## 2015-12-27 MED ORDER — LORAZEPAM 2 MG/ML IJ SOLN
1.0000 mg | Freq: Four times a day (QID) | INTRAMUSCULAR | Status: AC | PRN
  Administered 2015-12-27 – 2015-12-29 (×3): 1 mg via INTRAVENOUS
  Filled 2015-12-27 (×3): qty 1

## 2015-12-27 MED ORDER — LORAZEPAM 2 MG/ML IJ SOLN
1.0000 mg | Freq: Once | INTRAMUSCULAR | Status: AC
  Administered 2015-12-27: 1 mg via INTRAVENOUS
  Filled 2015-12-27: qty 1

## 2015-12-27 MED ORDER — FOLIC ACID 1 MG PO TABS
1.0000 mg | ORAL_TABLET | Freq: Every day | ORAL | Status: DC
  Administered 2015-12-27 – 2015-12-31 (×5): 1 mg via ORAL
  Filled 2015-12-27 (×5): qty 1

## 2015-12-27 MED ORDER — OCTREOTIDE LOAD VIA INFUSION
50.0000 ug | Freq: Once | INTRAVENOUS | Status: AC
  Administered 2015-12-27: 50 ug via INTRAVENOUS
  Filled 2015-12-27: qty 25

## 2015-12-27 MED ORDER — PANTOPRAZOLE SODIUM 40 MG IV SOLR: 40.0000 mg | Freq: Two times a day (BID) | INTRAVENOUS | Status: DC

## 2015-12-27 NOTE — ED Provider Notes (Signed)
TIME SEEN: 12:53 PM  CHIEF COMPLAINT: Hematemesis  HPI:   [name] [name] is a 50 y.o. male with a history of esophogeal varices, cirrhosis, thrombocytopenia and alcohol abuse who presents to the Emergency Department complaining of hematemesis and syncope today.  Pt states that he's vomited blood 3 times today.  He also reports bright red blood in his stool, chest pain that feels like tightness and SOB.  Pt states he fainted 2-3 times today; nobody witnessed his syncope.  No known seizure activity. Pt notes chest pain and dizziness before passing out. His daughter reports he has been drinking heavily.  Pt states he typically drinks a 12 pack a day and has for 20 years; today he drank 2 forty ounce beers today.  Pt had an endoscopy performed 3-4 years ago but does not remember his GI doctor.  He denies melena.  Has had upper abdominal pain.   Patient is a full code.  ROS: See HPI Constitutional: no fever  Eyes: no drainage  ENT: no runny nose   Cardiovascular:  Positive chest pain  Resp:  SOB  Gi: Positive hematemesis, blood in stool GU: no dysuria Integumentary: no rash  Allergy: no hives  Musculoskeletal: no leg swelling  Neurological: no slurred speech Positive syncope.   ROS otherwise negative  PAST MEDICAL HISTORY/PAST SURGICAL HISTORY:  Past Medical History:  Diagnosis Date  . Alcohol abuse   . Cirrhosis (HCC)   . Diabetes mellitus without complication (HCC)   . GERD (gastroesophageal reflux disease)   . H/O hiatal hernia   . Headache(784.0)   . No pertinent past medical history   . Seizures (HCC)   . Shortness of breath   . Sleep apnea     MEDICATIONS:  Prior to Admission medications   Medication Sig Start Date End Date Taking? Authorizing Provider  carvedilol (COREG) 3.125 MG tablet Take 1 tablet (3.125 mg total) by mouth 2 (two) times daily with a meal. 10/02/15   Marquette Saa, MD  FLUoxetine (PROZAC) 20 MG capsule Take 1 capsule (20 mg total) by mouth  daily. 10/02/15   Marquette Saa, MD  lactulose (CHRONULAC) 10 GM/15ML solution Take 30 mLs (20 g total) by mouth 2 (two) times daily. 10/02/15   Marquette Saa, MD    ALLERGIES:  No Known Allergies  SOCIAL HISTORY:  Social History  Substance Use Topics  . Smoking status: Former Smoker    Packs/day: 0.25    Years: 10.00    Types: Cigarettes    Quit date: 09/30/2007  . Smokeless tobacco: Never Used  . Alcohol use Yes     Comment: 10/01/2014  "  i USUALLY DRINK A 12 PK ON THE WEEKENDS "    FAMILY HISTORY: Family History  Problem Relation Age of Onset  . Hypertension Mother   . Syncope episode Sister     EXAM: BP 158/95 (BP Location: Left Arm)   Pulse 74   Temp 98.2 F (36.8 C) (Oral)   Resp 18   SpO2 92%  CONSTITUTIONAL: Alert and oriented and responds appropriately to questions. Chronically ill appearing, no distress.  Appears intoxicated. HEAD: Normocephalic EYES: Conjunctivae clear, PERRL ENT: normal nose; no rhinorrhea; moist mucous membranes NECK: Supple, no meningismus, no LAD  CARD: RRR; S1 and S2 appreciated; no murmurs, no clicks, no rubs, no gallops RESP: Normal chest excursion without splinting or tachypnea; breath sounds clear and equal bilaterally; no wheezes, no rhonchi, no rales, no hypoxia or respiratory distress, speaking full sentences  ABD/GI: Normal bowel sounds; non-distended; soft, no rebound, no guarding, no peritoneal signs Tender in the epigastric region. RECTAL:  Normal rectal tone, no gross blood or melena, guaiac negative, no hemorrhoids appreciated, nontender rectal exam, no fecal impaction BACK:  The back appears normal and is non-tender to palpation, there is no CVA tenderness EXT: Normal ROM in all joints; non-tender to palpation; no edema; normal capillary refill; no cyanosis, no calf tenderness or swelling    SKIN: Normal color for age and race; warm; no rash NEURO: Moves all extremities equally, sensation to light touch  intact diffusely, cranial nerves II through XII intact PSYCH: The patient's mood and manner are appropriate. Grooming and personal hygiene are appropriate.  MEDICAL DECISION MAKING: Patient here with hematemesis, bright red blood per rectum. Also complaining of upper abdominal pain, chest pain. Abdomen is tender but non-peritoneal. Labs, x-ray pending. We'll give IV fluids, Protonix and octreotide given history of esophageal varices documented in the chart. Anticipate admission.  At this time he is hemodynamically stable.  ED PROGRESS: 1:35 AM  Pt has a hemoglobin of 17, platelets of 24,000. Given he is bleeding we will transfuse patient with platelets. His coags are pending at this time.  2:30 AM  Pt remains hemodynamically stable. He has not had any vomiting in the emergency department. His coags are normal. He has had episodes of tremors of the right upper extremity but is lucid during these episodes. He is unable to tell me however how long this has been going on. He denies a history of seizures and is not on seizure medications. Will give dose of IV Ativan. We'll also give oral folic acid. We'll discuss with medicine for admission. I feel GI can be consulted in the morning.  2:35 AM  Discussed patient's case with hospitalist, Dr. Julian ReilGardner.  Recommend admission to telemetry, observation bed.  I will place holding orders per their request. Patient and family (if present) updated with plan. Care transferred to hospitalist service.  I reviewed all nursing notes, vitals, pertinent old records, EKGs, labs, imaging (as available).    EKG Interpretation  Date/Time:  Saturday December 27 2015 01:23:03 EDT Ventricular Rate:  88 PR Interval:    QRS Duration: 119 QT Interval:  398 QTC Calculation: 482 R Axis:   -51 Text Interpretation:  Sinus rhythm Nonspecific IVCD with LAD No significant change since last tracing Confirmed by Cypher Paule,  DO, Gionna Polak ([id number] on 12/27/2015 1:25:14 AM        CRITICAL  CARE Performed by: Raelyn NumberWARD, Rosalie Gelpi N   Total critical care time: 35 minutes  Critical care time was exclusive of separately billable procedures and treating other patients.  Critical care was necessary to treat or prevent imminent or life-threatening deterioration.  Critical care was time spent personally by me on the following activities: development of treatment plan with patient and/or surrogate as well as nursing, discussions with consultants, evaluation of patient's response to treatment, examination of patient, obtaining history from patient or surrogate, ordering and performing treatments and interventions, ordering and review of laboratory studies, ordering and review of radiographic studies, pulse oximetry and re-evaluation of patient's condition.   I personally performed the services described in this documentation, which was scribed in my presence. The recorded information has been reviewed and is accurate.      Layla MawKristen N Bartlomiej Jenkinson, DO 12/27/15 [phone]

## 2015-12-27 NOTE — ED Notes (Signed)
Critical platelets 24, reported to Dr. Elesa MassedWard and Teodora MediciBrittany O, RN

## 2015-12-27 NOTE — Progress Notes (Signed)
[name] [name] is a 50 y.o. male with medical history significant of esophageal varices, cirrhosis, ongoing EtOH abuse.  Patient presents to the ED with c/o 3 episodes of vomiting dark red blood at home per patient and family members who are with him at time of presentation to ED.  There is also some report of passing one stool of BRB.  Has been drinking heavily today.  Denies melena.  Had syncopal episode today at home.  ED Course: HGB 17.6, guiac negative, no vomiting thus far in ED.  Given zofran for nausea, octreotide and PPI gtt are started, platelets 24 so he is transfused platelets as well. EtOH level 475  Assessment and plan Hematemesis vs  Epistaxis vs   hemoptysis - patient is a very poor historian concern for variceal bleeding    Chest x-ray shows cardiomegaly with vascular congestion, no pneumonia  Started on Protonix and octreotide Status post 1 unit of platelets Hemoglobin stable INR 1.23 [place] [facility] consulted for possible need for EGD  Fall Apparently the patient fell, need to rule out intracranial bleeding   Thrombocytopenia-likely secondary to EtOH abuse Continue to monitor without further transfusions  Alcohol abuse Previously screen for depression on a recent admission, does not appear to be suicidal or homicidal Continue CIWA protocol Replete electrolytes  Transaminitis-AST is greater than ALT consistent with alcohol abuse Monitor closely

## 2015-12-27 NOTE — Progress Notes (Signed)
Pt more awake and oriented x 3. Severe liver flap Rt hand and mild tremor L hand. Ativan given. Tolerating clear liquids. No episode of bleeding today.

## 2015-12-27 NOTE — ED Notes (Signed)
Dr. Gardner at bedside 

## 2015-12-27 NOTE — Progress Notes (Signed)
Patient C/O  C/Ppain 9/10.Md already on phone, V/SS, EKG done. Per Dr Izola PriceMyers to give patient Nitro SL x one . O2 @ 4L. V/SS after. Troponin X1 ordered.

## 2015-12-27 NOTE — ED Triage Notes (Signed)
Pt brought in family today for reports of bright red emesis x 3 today. Family also reports pt was shaking earlier. Pt reports he drank heavily yesterday but not as much today. Pt does have hx of w/d seizures.  No tremors noted on arrival, A & O.  Limited [place]English

## 2015-12-27 NOTE — Consult Note (Signed)
Referring Provider: No ref. provider found Primary Care Physician:  Jeanann Lewandowsky, MD Primary Gastroenterologist:  none Reason for Consultation:  GI bleed  HPI: [name] [name] is a 50 y.o. male with a history of severe esophagitis, H.pylori, cirrhosis with variceal bleed banded at [facility] 2013,  ETOH abuse, DM2. He presented to ED around midnight with hematemesis. Family reports "shaking". Hypertensive today.   Speaks very little [place], I used telephone interpreter. Patient repeatedly denies hematemesis. He had a bloody nose after falling. No black stools.    Patient gives a several day history of chest pain, worse with activity. Gets SOB with exertion.     Past Medical History:  Diagnosis Date  . Alcohol abuse   . Cirrhosis (HCC)   . Diabetes mellitus without complication (HCC)   . GERD (gastroesophageal reflux disease)   . H/O hiatal hernia   . Headache(784.0)   . No pertinent past medical history   . Seizures (HCC)   . Shortness of breath   . Sleep apnea     Past Surgical History:  Procedure Laterality Date  . APPENDECTOMY    . NO PAST SURGERIES    . RADIOLOGY WITH ANESTHESIA  01/30/2012   Procedure: RADIOLOGY WITH ANESTHESIA;  Surgeon: Casimiro Needle T. Miles Costain, MD;  Location: [facility] OR;  Service: Radiology;  Laterality: N/A;    Prior to Admission medications   Medication Sig Start Date End Date Taking? Authorizing Provider  carvedilol (COREG) 3.125 MG tablet Take 1 tablet (3.125 mg total) by mouth 2 (two) times daily with a meal. Patient not taking: Reported on 12/27/2015 10/02/15   Marquette Saa, MD  FLUoxetine (PROZAC) 20 MG capsule Take 1 capsule (20 mg total) by mouth daily. Patient not taking: Reported on 12/27/2015 10/02/15   Marquette Saa, MD  lactulose [facility]) 10 GM/15ML solution Take 30 mLs (20 g total) by mouth 2 (two) times daily. Patient not taking: Reported on 12/27/2015 10/02/15   Marquette Saa, MD    Current  Facility-Administered Medications  Medication Dose Route Frequency Provider Last Rate Last Dose  . [COMPLETED] 0.9 %  sodium chloride infusion   Intravenous STAT Kristen N Ward, DO 125 mL/hr at 12/27/15 0941    . folic acid (FOLVITE) tablet 1 mg  1 mg Oral Daily Kristen N Ward, DO   1 mg at 12/27/15 0945  . LORazepam (ATIVAN) tablet 1 mg  1 mg Oral Q6H PRN Hillary Bow, DO       Or  . LORazepam (ATIVAN) injection 1 mg  1 mg Intravenous Q6H PRN Hillary Bow, DO      . magnesium sulfate IVPB 2 g 50 mL  2 g Intravenous Once Richarda Overlie, MD   2 g at 12/27/15 0951  . multivitamin with minerals tablet 1 tablet  1 tablet Oral Daily Hillary Bow, DO   1 tablet at 12/27/15 0944  . nitroGLYCERIN (NITROSTAT) SL tablet 0.4 mg  0.4 mg Sublingual Q5 min PRN Dorothea Ogle, MD   0.4 mg at 12/27/15 1610  . octreotide (SANDOSTATIN) 500 mcg in sodium chloride 0.9 % 250 mL (2 mcg/mL) infusion  50 mcg/hr Intravenous Continuous Kristen N Ward, DO 25 mL/hr at 12/27/15 0133 50 mcg/hr at 12/27/15 0133  . ondansetron (ZOFRAN) injection 4 mg  4 mg Intravenous Q6H PRN Hillary Bow, DO      . pantoprazole (PROTONIX) 80 mg in sodium chloride 0.9 % 250 mL (0.32 mg/mL) infusion  8 mg/hr  Intravenous Continuous Kristen N Ward, DO 25 mL/hr at 12/27/15 0132 8 mg/hr at 12/27/15 0132  . [START ON 12/30/2015] pantoprazole (PROTONIX) injection 40 mg  40 mg Intravenous Q12H Kristen N Ward, DO      . thiamine (VITAMIN B-1) tablet 100 mg  100 mg Oral Daily Hillary Bow, DO       Or  . thiamine (B-1) injection 100 mg  100 mg Intravenous Daily Hillary Bow, DO   100 mg at 12/27/15 0944    Allergies as of 12/27/2015  . (No Known Allergies)    Family History  Problem Relation Age of Onset  . Hypertension Mother   . Syncope episode Sister     Social History   Social History  . Marital status: Single    Spouse name: N/A  . Number of children: N/A  . Years of education: N/A   Occupational History  .  umemployed    Social History Main Topics  . Smoking status: Former Smoker    Packs/day: 0.25    Years: 10.00    Types: Cigarettes    Quit date: 09/30/2007  . Smokeless tobacco: Never Used  . Alcohol use Yes     Comment: 10/01/2014  "  i USUALLY DRINK A 12 PK ON THE WEEKENDS "  . Drug use: No  . Sexual activity: Not Currently   Other Topics Concern  . Not on file   Social History Narrative   Lives alone.    Review of Systems: All systems reviewed and negative except where noted in HPI.  Physical Exam: Vital signs in last 24 hours: Temp:  [97.8 F (36.6 C)-98.2 F (36.8 C)] 98 F (36.7 C) (10/21 0514) Pulse Rate:  [74-103] 95 (10/21 0603) Resp:  [11-21] 20 (10/21 0626) BP: (110-160)/(63-105) 110/63 (10/21 0626) SpO2:  [89 %-99 %] 95 % (10/21 0626) Weight:  [255 lb 14.4 oz (116.1 kg)] 255 lb 14.4 oz (116.1 kg) (10/21 0514) Last BM Date: 12/26/15 General:  Well developed Hispanic male awoken from sleep for interview.   Psych:   cooperative. Normal mood and affect. Head:  Normocephalic and atraumatic. Ears:  Normal auditory acuity. Neck:  Supple; no masses  Lungs:  Clear throughout to auscultation.  No wheezes, crackles, or rhonchi.  Heart:  Regular rate and rhythm; no murmurs, no peripheral edema Abdomen:  Soft,nontender, BS active,no palp mass or hepatomegaly.   Msk:  Symmetrical without gross deformities.  Neurologic:  Alert and  oriented x4;  grossly normal neurologically.No asterixis Skin:  Intact without significant lesions or rashes..  Intake/Output from previous day: 10/20 0701 - 10/21 0700 In: 1482 [Blood:382; IV Piggyback:1100] Out: -  Intake/Output this shift: No intake/output data recorded.  Lab Results:  Recent Labs  12/27/15 0051 12/27/15 0110 12/27/15 0732  WBC 3.4*  --  2.9*  HGB 17.6* 17.3* 15.9  HCT 50.3 51.0 45.7  PLT 24*  --  30*   BMET  Recent Labs  12/27/15 0051 12/27/15 0110 12/27/15 0732  NA 141 144 144  K 3.3* 3.4* 3.7  CL  102 102 108  CO2 27  --  22  GLUCOSE 132* 129* 80  BUN <5* <3* <5*  CREATININE 0.46* 1.00 0.51*  CALCIUM 8.6*  --  7.4*   LFT  Recent Labs  12/27/15 0051  PROT 7.6  ALBUMIN 3.6  AST 290*  ALT 127*  ALKPHOS 88  BILITOT 4.4*   PT/INR  Recent Labs  12/27/15 0059  LABPROT 15.5*  INR 1.23    Studies/Results: Dg Chest Port 1 View  Result Date: 12/27/2015 CLINICAL DATA:  Chest and abdominal pain. Right emesis x3. Patient drank heavily yesterday. EXAM: PORTABLE CHEST 1 VIEW COMPARISON:  11/23/2015 FINDINGS: The cardiac silhouette appears enlarged. There is mild pulmonary vascular congestion. No pneumonic consolidation, effusion or pneumothorax. No suspicious osseous abnormality. IMPRESSION: Cardiomegaly suspected along for portable technique. Vascular congestion. No definite pneumonic consolidation. Electronically Signed   By: Tollie Ethavid  Kwon M.D.   On: 12/27/2015 03:00   Dg Abd Portable 2v  Result Date: 12/27/2015 CLINICAL DATA:  Hematemesis, alcohol abuse and cirrhosis. Esophageal varices. EXAM: PORTABLE ABDOMEN - 2 VIEW COMPARISON:  None. FINDINGS: The patient is status post TIPS. Bowel gas pattern is unremarkable. No acute osseous abnormality is seen. Surgical clips are noted in the right upper quadrant. No apparent free air. IMPRESSION: Unremarkable bowel gas pattern.  Status post TIPS. Electronically Signed   By: Tollie Ethavid  Kwon M.D.   On: 12/27/2015 03:03    IMPRESSION / PLAN:   Cirrhosis, presumably secondary to ETOH with hx of variceal bleed. Viral hepatitis studies negative in 2016. Patient has a TIPS [facility] in [facility]. Doesn't follow with GI . Intoxicated in ED.  -Does not appear to be having a GI bleed so didn't start antibiotics for SBP prophylaxis -Can better assess mental status when ETOH and Ativan wear off (on CIWA) -Eventual HCC surveillance  -He will need outpatient follow up for cirrhosis -obviously needs to stop drinking ETOH.    Hepatitis, presumably  alcoholic.  Viral hepatitis studies negative in 2013.   Vomiting / ? Episode of hematemesis. I used Facilities managerpanish Interpreter line to get more details. Patient repeatedly denies vomiting blood, states he fell and hit nose which bled. No blood in stools or black stools. BUN is normal. His hgb is 15.9, baseline being anywhere from 15-17.   -At this point it doesn't seem like patient is having a GI bleed but of course we will monitor him closely. I have consented him for an EGD (via interpreter)  in case it is needed -Will stop Octrotide -can d/c ppi gtt, BID PPI okay -serial CBCs.   Chest pain / DOE. He describes a 4 day history of chest pain, worse with exertion. Additionally he reports dyspnea on exertion such as stair climbing. Troponin at 7:30am negative. I did talk with the Hospitalist who will be evaluating him.   Low Mg+ . Primary team repleting.   ETOH abuse. Patient "shaking" at home. BP elevated. On CIWA. ETOH 475 today.   Severe thrombocytopenia secondary to cirrhosis, possibly ETOH abuse. Platelets were 24, he received platelet transfusion in ED around 3:30am, count 30 at 7:30am    Leukopenia, prob BM suppression from ETOH  [facility] Anjelita Sheahan  12/27/2015, 10:26 AM  Pager number [phone]

## 2015-12-27 NOTE — ED Notes (Signed)
Attempted report x1. 

## 2015-12-27 NOTE — H&P (Signed)
History and Physical    [name] [name] ZOX:[id number] DOB: 21-Aug-1965 DOA: 12/27/2015   PCP: Jeanann Lewandowsky, MD Chief Complaint:  Chief Complaint  Patient presents with  . GI Bleeding    HPI: [name] [name] is a 50 y.o. male with medical history significant of esophageal varices, cirrhosis, ongoing EtOH abuse.  Patient presents to the ED with c/o 3 episodes of vomiting dark red blood at home per patient and family members who are with him at time of presentation to ED.  There is also some report of passing one stool of BRB.  Has been drinking heavily today.  Denies melena.  Had syncopal episode today at home.  ED Course: HGB 17.6, guiac negative, no vomiting thus far in ED.  Given zofran for nausea, octreotide and PPI gtt are started, platelets 24 so he is transfused platelets as well.  Review of Systems: As per HPI otherwise 10 point review of systems negative.    Past Medical History:  Diagnosis Date  . Alcohol abuse   . Cirrhosis (HCC)   . Diabetes mellitus without complication (HCC)   . GERD (gastroesophageal reflux disease)   . H/O hiatal hernia   . Headache(784.0)   . No pertinent past medical history   . Seizures (HCC)   . Shortness of breath   . Sleep apnea     Past Surgical History:  Procedure Laterality Date  . APPENDECTOMY    . NO PAST SURGERIES    . RADIOLOGY WITH ANESTHESIA  01/30/2012   Procedure: RADIOLOGY WITH ANESTHESIA;  Surgeon: Casimiro Needle T. Miles Costain, MD;  Location: [facility] OR;  Service: Radiology;  Laterality: N/A;     reports that he quit smoking about 8 years ago. His smoking use included Cigarettes. He has a 2.50 pack-year smoking history. He has never used smokeless tobacco. He reports that he drinks alcohol. He reports that he does not use drugs.  No Known Allergies  Family History  Problem Relation Age of Onset  . Hypertension Mother   . Syncope episode Sister       Prior to Admission medications   Medication Sig Start Date End Date Taking?  Authorizing Provider  carvedilol (COREG) 3.125 MG tablet Take 1 tablet (3.125 mg total) by mouth 2 (two) times daily with a meal. Patient not taking: Reported on 12/27/2015 10/02/15   Marquette Saa, MD  FLUoxetine (PROZAC) 20 MG capsule Take 1 capsule (20 mg total) by mouth daily. Patient not taking: Reported on 12/27/2015 10/02/15   Marquette Saa, MD  lactulose [facility]) 10 GM/15ML solution Take 30 mLs (20 g total) by mouth 2 (two) times daily. Patient not taking: Reported on 12/27/2015 10/02/15   Marquette Saa, MD    Physical Exam: Vitals:   12/27/15 0152 12/27/15 0209 12/27/15 0226 12/27/15 0230  BP: (!) 160/101 (!) 156/104 (!) 158/105 (!) 145/101  Pulse: 86 88 91 89  Resp:  16 18 20   Temp:  97.9 F (36.6 C) 97.8 F (36.6 C)   TempSrc:  Oral Oral   SpO2:  99% 99% 96%      Constitutional: NAD, calm, comfortable Eyes: PERRL, lids and conjunctivae normal ENMT: Mucous membranes are moist. Posterior pharynx clear of any exudate or lesions.Normal dentition.  Neck: normal, supple, no masses, no thyromegaly Respiratory: clear to auscultation bilaterally, no wheezing, no crackles. Normal respiratory effort. No accessory muscle use.  Cardiovascular: Regular rate and rhythm, no murmurs / rubs / gallops. No extremity edema. 2+ pedal pulses. No carotid bruits.  Abdomen: no tenderness, no masses palpated. No hepatosplenomegaly. Bowel sounds positive.  Musculoskeletal: no clubbing / cyanosis. No joint deformity upper and lower extremities. Good ROM, no contractures. Normal muscle tone.  Skin: no rashes, lesions, ulcers. No induration Neurologic: CN 2-12 grossly intact. Sensation intact, DTR normal. Strength 5/5 in all 4.  Psychiatric: Normal judgment and insight. Alert and oriented x 3. Normal mood.    Labs on Admission: I have personally reviewed following labs and imaging studies  CBC:  Recent Labs Lab 12/27/15 0051 12/27/15 0110  WBC 3.4*  --   HGB  17.6* 17.3*  HCT 50.3 51.0  MCV 98.2  --   PLT 24*  --    Basic Metabolic Panel:  Recent Labs Lab 12/27/15 0051 12/27/15 0110  NA 141 144  K 3.3* 3.4*  CL 102 102  CO2 27  --   GLUCOSE 132* 129*  BUN <5* <3*  CREATININE 0.46* 1.00  CALCIUM 8.6*  --    GFR: CrCl cannot be calculated (Unknown ideal weight.). Liver Function Tests:  Recent Labs Lab 12/27/15 0051  AST 290*  ALT 127*  ALKPHOS 88  BILITOT 4.4*  PROT 7.6  ALBUMIN 3.6   No results for input(s): LIPASE, AMYLASE in the last 168 hours. No results for input(s): AMMONIA in the last 168 hours. Coagulation Profile:  Recent Labs Lab 12/27/15 0059  INR 1.23   Cardiac Enzymes: No results for input(s): CKTOTAL, CKMB, CKMBINDEX, TROPONINI in the last 168 hours. BNP (last 3 results) No results for input(s): PROBNP in the last 8760 hours. HbA1C: No results for input(s): HGBA1C in the last 72 hours. CBG: No results for input(s): GLUCAP in the last 168 hours. Lipid Profile: No results for input(s): CHOL, HDL, LDLCALC, TRIG, CHOLHDL, LDLDIRECT in the last 72 hours. Thyroid Function Tests: No results for input(s): TSH, T4TOTAL, FREET4, T3FREE, THYROIDAB in the last 72 hours. Anemia Panel: No results for input(s): VITAMINB12, FOLATE, FERRITIN, TIBC, IRON, RETICCTPCT in the last 72 hours. Urine analysis:    Component Value Date/Time   COLORURINE AMBER (A) 09/30/2015 0607   APPEARANCEUR CLEAR 09/30/2015 0607   LABSPEC >1.046 (H) 09/30/2015 0607   PHURINE 6.0 09/30/2015 0607   GLUCOSEU NEGATIVE 09/30/2015 0607   HGBUR NEGATIVE 09/30/2015 0607   BILIRUBINUR SMALL (A) 09/30/2015 0607   KETONESUR NEGATIVE 09/30/2015 0607   PROTEINUR NEGATIVE 09/30/2015 0607   UROBILINOGEN 4.0 (H) 06/11/2013 1912   NITRITE NEGATIVE 09/30/2015 0607   LEUKOCYTESUR NEGATIVE 09/30/2015 0607   Sepsis Labs: @LABRCNTIP (procalcitonin:4,lacticidven:4) )No results found for this or any previous visit (from the past 240 hour(s)).    Radiological Exams on Admission: No results found.  EKG: Independently reviewed.  Assessment/Plan Principal Problem:   Hematemesis Active Problems:   Alcohol abuse   Alcoholic cirrhosis (HCC)   Thrombocytopenia, secondary   Esophageal varices in alcoholic cirrhosis (HCC)    1. Hematemesis 1. Tele monitor 2. Repeat CBC in AM 3. Continue ppi and octreotide for now 4. Observe for further signs of bleeding 5. Call GI in AM, sooner if he has further signs of bleeding / becomes unstable. 6. Does have h/o esophageal varices. 2. EtOH abuse - 1. CIWA 2. Currently intoxicated 3. Thrombocytopenia - 1. Secondary to cirrhosis 2. Got platelets in ED   DVT prophylaxis: SCDS Code Status: Full Family Communication: no family at bedside Consults called: none Admission status: admit to obs   Sunny Gains, Heywood IlesJARED M. DO [facility] Pager [phone] from 7PM-7AM  If 7AM-7PM, please contact the day  physician for the patient [email or web address] Password [email or web address]  12/27/2015, 2:51 AM

## 2015-12-28 LAB — CBC
HCT: 39.9 % (ref 39.0–52.0)
Hemoglobin: 13.8 g/dL (ref 13.0–17.0)
MCH: 34.2 pg — ABNORMAL HIGH (ref 26.0–34.0)
MCHC: 34.6 g/dL (ref 30.0–36.0)
MCV: 99 fL (ref 78.0–100.0)
PLATELETS: 20 10*3/uL — AB (ref 150–400)
RBC: 4.03 MIL/uL — AB (ref 4.22–5.81)
RDW: 12.3 % (ref 11.5–15.5)
WBC: 2.2 10*3/uL — AB (ref 4.0–10.5)

## 2015-12-28 LAB — COMPREHENSIVE METABOLIC PANEL
ALBUMIN: 2.9 g/dL — AB (ref 3.5–5.0)
ALT: 102 U/L — AB (ref 17–63)
AST: 238 U/L — AB (ref 15–41)
Alkaline Phosphatase: 73 U/L (ref 38–126)
Anion gap: 8 (ref 5–15)
CHLORIDE: 103 mmol/L (ref 101–111)
CO2: 30 mmol/L (ref 22–32)
CREATININE: 0.5 mg/dL — AB (ref 0.61–1.24)
Calcium: 7.4 mg/dL — ABNORMAL LOW (ref 8.9–10.3)
GFR calc Af Amer: >60 mL/min (ref >60)
GLUCOSE: 123 mg/dL — AB (ref 65–99)
POTASSIUM: 3.1 mmol/L — AB (ref 3.5–5.1)
SODIUM: 141 mmol/L (ref 135–145)
Total Bilirubin: 4.7 mg/dL — ABNORMAL HIGH (ref 0.3–1.2)
Total Protein: 6.2 g/dL — ABNORMAL LOW (ref 6.5–8.1)

## 2015-12-28 LAB — PREPARE PLATELET PHERESIS: Unit division: 0

## 2015-12-28 LAB — MAGNESIUM: MAGNESIUM: 1.5 mg/dL — AB (ref 1.7–2.4)

## 2015-12-28 LAB — GLUCOSE, CAPILLARY
GLUCOSE-CAPILLARY: 116 mg/dL — AB (ref 65–99)
GLUCOSE-CAPILLARY: 119 mg/dL — AB (ref 65–99)
GLUCOSE-CAPILLARY: 152 mg/dL — AB (ref 65–99)
Glucose-Capillary: 174 mg/dL — ABNORMAL HIGH (ref 65–99)

## 2015-12-28 LAB — AMMONIA: AMMONIA: 202 umol/L — AB (ref 9–35)

## 2015-12-28 MED ORDER — TRAMADOL HCL 50 MG PO TABS
50.0000 mg | ORAL_TABLET | Freq: Once | ORAL | Status: AC
  Administered 2015-12-28: 50 mg via ORAL
  Filled 2015-12-28: qty 1

## 2015-12-28 MED ORDER — DIAZEPAM 5 MG PO TABS
5.0000 mg | ORAL_TABLET | Freq: Three times a day (TID) | ORAL | Status: DC | PRN
  Administered 2015-12-28: 10 mg via ORAL
  Filled 2015-12-28: qty 2

## 2015-12-28 MED ORDER — POTASSIUM CHLORIDE CRYS ER 20 MEQ PO TBCR
40.0000 meq | EXTENDED_RELEASE_TABLET | Freq: Two times a day (BID) | ORAL | Status: AC
  Administered 2015-12-28 – 2015-12-30 (×6): 40 meq via ORAL
  Filled 2015-12-28 (×6): qty 2

## 2015-12-28 MED ORDER — MAGNESIUM SULFATE 2 GM/50ML IV SOLN
2.0000 g | Freq: Once | INTRAVENOUS | Status: AC
  Administered 2015-12-28: 2 g via INTRAVENOUS
  Filled 2015-12-28: qty 50

## 2015-12-28 MED ORDER — DIAZEPAM 5 MG PO TABS
ORAL_TABLET | ORAL | Status: AC
  Administered 2015-12-28: 5 mg
  Filled 2015-12-28: qty 1

## 2015-12-28 MED ORDER — LACTULOSE 10 GM/15ML PO SOLN
20.0000 g | Freq: Three times a day (TID) | ORAL | Status: DC
  Administered 2015-12-28 – 2015-12-31 (×8): 20 g via ORAL
  Filled 2015-12-28 (×8): qty 30

## 2015-12-28 NOTE — Evaluation (Signed)
Physical Therapy Evaluation Patient Details Name: [name] [name] MRN: [id number] DOB: Aug 21, 1965 Today's Date: 12/28/2015   History of Present Illness  [name] [name] is a 50 y.o. male with medical history significant of esophageal varices, cirrhosis, ongoing EtOH abuse.  Patient presents to the ED with c/o 3 episodes of vomiting dark red blood at home per patient and family members who are with him at time of presentation to ED.  There is also some report of passing one stool of BRB.  Clinical Impression  Pt admitted with above diagnosis. Pt currently with functional limitations due to the deficits listed below (see PT Problem List). Currently very unsteady, shaky, tremulous on feet;  Pt will benefit from skilled PT to increase their independence and safety with mobility to allow discharge to the venue listed below.    Pt's niece voiced the question of getting a visa for pt's wife to come up from [place] to assist him in the home. Noted SW is consulted     Follow Up Recommendations Home health PT;Supervision/Assistance - 24 hour (Given insurance, don't think HHPT will be covered)    Equipment Recommendations  Rolling walker with 5" wheels;3in1 (PT)    Recommendations for Other Services       Precautions / Restrictions Precautions Precautions: Fall      Mobility  Bed Mobility Overal bed mobility: Needs Assistance Bed Mobility: Supine to Sit     Supine to sit: Min guard     General bed mobility comments: Used rails; very slow moving and inefficient  Transfers Overall transfer level: Needs assistance Equipment used: Rolling walker (2 wheeled) Transfers: Sit to/from Stand Sit to Stand: Mod assist         General transfer comment: Mod assist to power up; shaky, tremulous  Ambulation/Gait Ambulation/Gait assistance: Min assist;Mod assist Ambulation Distance (Feet): 6 Feet (forward and backward) Assistive device: Rolling walker (2 wheeled) Gait Pattern/deviations:  Decreased step length - right;Decreased step length - left;Decreased stance time - right;Decreased stance time - left;Shuffle     General Gait Details: Short steps, decr weight shift fully into stance R or L , resulting in very short, shuffle steps; shaky, tremulous; voiced fear of falling and requested to walk back to bed  Stairs            Wheelchair Mobility    Modified Rankin (Stroke Patients Only)       Balance Overall balance assessment: Needs assistance   Sitting balance-Leahy Scale: Fair       Standing balance-Leahy Scale: Poor Standing balance comment: Dependent on UE support                             Pertinent Vitals/Pain Pain Assessment: 0-10 Pain Score: 8  Pain Location: Chest pain; did not change with getting up and OOB to chair; HR 96; RNnotified Pain Descriptors / Indicators: Aching Pain Intervention(s): Monitored during session;Other (comment) (RN notified of chest pain)    Home Living Family/patient expects to be discharged to:: Private residence Living Arrangements: Other relatives Available Help at Discharge: Family;Available PRN/intermittently Type of Home: House Home Access: Stairs to enter Entrance Stairs-Rails: Doctor, general practice of Steps: 6 Home Layout: Two level        Prior Function Level of Independence: Independent               Hand Dominance        Extremity/Trunk Assessment   Upper Extremity Assessment: Generalized weakness  Lower Extremity Assessment: Generalized weakness         Communication   Communication: Prefers language other than English (Spanish)  Cognition Arousal/Alertness: Awake/alert Behavior During Therapy: WFL for tasks assessed/performed Overall Cognitive Status: Within Functional Limits for tasks assessed (for simple mobility tasks)                      General Comments      Exercises     Assessment/Plan    PT Assessment Patient  needs continued PT services  PT Problem List Decreased strength;Decreased activity tolerance;Decreased balance;Decreased coordination;Decreased mobility;Decreased cognition;Decreased knowledge of use of DME;Decreased safety awareness;Decreased knowledge of precautions;Pain          PT Treatment Interventions DME instruction;Gait training;Stair training;Functional mobility training;Therapeutic activities;Therapeutic exercise;Balance training;Neuromuscular re-education;Cognitive remediation;Patient/family education    PT Goals (Current goals can be found in the Care Plan section)  Acute Rehab PT Goals Patient Stated Goal: did not state PT Goal Formulation: With patient Time For Goal Achievement: 01/11/16 Potential to Achieve Goals: Good    Frequency Min 3X/week   Barriers to discharge Inaccessible home environment;Decreased caregiver support 6 steps to enter; is home alone until around 2 pm    Co-evaluation               End of Session Equipment Utilized During Treatment: Gait belt Activity Tolerance: Patient tolerated treatment well Patient left: in chair;with call bell/phone within reach;with chair alarm set Nurse Communication: Mobility status         Time: 1459-1526 PT Time Calculation (min) (ACUTE ONLY): 27 min   Charges:   PT Evaluation $PT Eval Moderate Complexity: 1 Procedure PT Treatments $Gait Training: 8-22 mins   PT G Codes:        Olen PelGarrigan, Hosie Sharman Hamff 12/28/2015, 3:48 PM  Van ClinesHolly Byren Pankow, [place] CarolinaPT  Acute Rehabilitation Services Pager [phone] Office [phone]

## 2015-12-28 NOTE — Progress Notes (Addendum)
[facility] PROGRESS NOTE  [name] [name]:[id number] DOB: Apr 13, 1965 DOA: 12/27/2015   PCP: Jeanann Lewandowsky, MD     Assessment/Plan: Principal Problem:   Hematemesis Active Problems:   Alcohol abuse   Alcoholic cirrhosis (HCC)   Thrombocytopenia, secondary   Esophageal varices in alcoholic cirrhosis (HCC)   Fall    [name] Martinezis a 50 y.o.malewith medical history significant of esophageal varices, cirrhosis, ongoing EtOH abuse. Patient presents to the ED with c/o 3 episodes of vomiting dark red blood at home per patient and family members who are with him at time of presentation to ED. There is also some report of passing one stool of BRB. Has been drinking heavily today. Denies melena. Had syncopal episode today at home.  ED Course:HGB 17.6, guiac negative, no vomiting thus far in ED. Given zofran for nausea, octreotide and PPI gtt are started, platelets 24 so he is transfused platelets as well. EtOH level 475  Assessment and plan Hematemesis vs  Epistaxis vs   hemoptysis - patient is a very poor historian concern for variceal bleeding   ,adamantly denies any hematemesis, or melena Chest x-ray shows cardiomegaly with vascular congestion, no pneumonia  Started on Protonix and octreotide, octreotide drip has been discontinued Status post 1 unit of platelets, Hemoglobin slowly trending down INR 1.23 [facility] consulted to assess need for EGD.history of variceal bleeding s/p TIPS They  recommend screening for hepatocellular carcinoma  Fall due to hepatic encephalopathy Apparently the patient fell, need to rule out intracranial bleeding  Ammonia 202 , started on lactulose TID   Thrombocytopenia-likely secondary to EtOH abuse Continue to monitor without further transfusions No active bleeding at this time  Alcohol abuse Previously screen for depression on a recent admission, does not appear to be suicidal or  homicidal Continue CIWA protocol Replete electrolytes  Transaminitis-AST is greater than ALT consistent with alcohol abuse Monitor closely Viral hepatitis studies negative in 2013.   Alcoholic/metabolic encephalopathy/delirium tremors-on CIWA protocol, check ammonia level, check alpha-fetoprotein. CT of the brain negative Started scheduled dose of Valium, but discontinued as this could exacerbate his hepatic encephalopathy CIWA protocol  Hypokalemia hypomagnesemia-replete   DVT prophylaxsis SCDs   Code Status:  Full code      Family Communication: Discussed in detail with the patient, all imaging results, lab results explained to the patient   Disposition Plan:  PT OT eval, anticipate discharge in 2-3 days      Consultants:  GI  Procedures:  None    Antibiotics: Anti-infectives    None         HPI/Subjective: Patient jittery, confused, upper extremities   shaky  Objective: Vitals:   12/27/15 1741 12/27/15 2013 12/28/15 0008 12/28/15 0500  BP: (!) 143/77 133/85 129/74 (!) 141/85  Pulse: 81 80 87 63  Resp: 16 16    Temp: 98.5 F (36.9 C) 98.7 F (37.1 C) 99.2 F (37.3 C) 97.7 F (36.5 C)  TempSrc: Oral Oral Oral Oral  SpO2: 95% 94% 98% 98%  Weight:    101.7 kg (224 lb 1.6 oz)    Intake/Output Summary (Last 24 hours) at 12/28/15 0820 Last data filed at 12/28/15 0745  Gross per 24 hour  Intake              462 ml  Output             4375 ml  Net            -3913 ml  Will  Exam:  Examination:  General exam: Appears calm and comfortable  Respiratory system: Clear to auscultation. Respiratory effort normal. Cardiovascular system: S1 & S2 heard, RRR. No JVD, murmurs, rubs, gallops or clicks. No pedal edema. Gastrointestinal system: Abdomen is nondistended, soft and nontender. No organomegaly or masses felt. Normal bowel sounds heard. Central nervous system: Alert and oriented. No focal neurological deficits. Extremities: Symmetric 5 x 5  power. Skin: No rashes, lesions or ulcers Psychiatry: Judgement and insight appear normal. Mood & affect appropriate.     Data Reviewed: I have personally reviewed following labs and imaging studies  Micro Results No results found for this or any previous visit (from the past 240 hour(s)).  Radiology Reports Ct Head Wo Contrast  Result Date: 12/27/2015 CLINICAL DATA:  50 year old male with history of trauma from a fall. History of seizures. Headache. EXAM: CT HEAD WITHOUT CONTRAST TECHNIQUE: Contiguous axial images were obtained from the base of the skull through the vertex without intravenous contrast. COMPARISON:  Head CT 09/29/2015. FINDINGS: Brain: No evidence of acute infarction, hemorrhage, hydrocephalus, extra-axial collection or mass lesion/mass effect. Vascular: No hyperdense vessel or unexpected calcification. Skull: Normal. Negative for fracture or focal lesion. Sinuses/Orbits: No acute finding. Other: None. IMPRESSION: 1. No acute intracranial abnormalities. 2. Normal appearance of the brain. Electronically Signed   By: [name] [name] M.D.   On: 12/27/2015 12:39   Dg Chest Port 1 View  Result Date: 12/27/2015 CLINICAL DATA:  Chest and abdominal pain. Right emesis x3. Patient drank heavily yesterday. EXAM: PORTABLE CHEST 1 VIEW COMPARISON:  11/23/2015 FINDINGS: The cardiac silhouette appears enlarged. There is mild pulmonary vascular congestion. No pneumonic consolidation, effusion or pneumothorax. No suspicious osseous abnormality. IMPRESSION: Cardiomegaly suspected along for portable technique. Vascular congestion. No definite pneumonic consolidation. Electronically Signed   By: Tollie Eth M.D.   On: 12/27/2015 03:00   Dg Abd Portable 2v  Result Date: 12/27/2015 CLINICAL DATA:  Hematemesis, alcohol abuse and cirrhosis. Esophageal varices. EXAM: PORTABLE ABDOMEN - 2 VIEW COMPARISON:  None. FINDINGS: The patient is status post TIPS. Bowel gas pattern is unremarkable. No acute  osseous abnormality is seen. Surgical clips are noted in the right upper quadrant. No apparent free air. IMPRESSION: Unremarkable bowel gas pattern.  Status post TIPS. Electronically Signed   By: Tollie Eth M.D.   On: 12/27/2015 03:03     CBC  Recent Labs Lab 12/27/15 0051 12/27/15 0110 12/27/15 0732 12/28/15 0317  WBC 3.4*  --  2.9* 2.2*  HGB 17.6* 17.3* 15.9 13.8  HCT 50.3 51.0 45.7 39.9  PLT 24*  --  30* 20*  MCV 98.2  --  100.0 99.0  MCH 34.4*  --  34.8* 34.2*  MCHC 35.0  --  34.8 34.6  RDW 12.6  --  12.8 12.3    Chemistries   Recent Labs Lab 12/27/15 0051 12/27/15 0110 12/27/15 0732 12/27/15 1021 12/28/15 0317  NA 141 144 144  --  141  K 3.3* 3.4* 3.7  --  3.1*  CL 102 102 108  --  103  CO2 27  --  22  --  30  GLUCOSE 132* 129* 80  --  123*  BUN <5* <3* <5*  --  <5*  CREATININE 0.46* 1.00 0.51*  --  0.50*  CALCIUM 8.6*  --  7.4*  --  7.4*  MG  --   --   --  1.5*  --   AST 290*  --   --  272*  238*  ALT 127*  --   --  112* 102*  ALKPHOS 88  --   --  72 73  BILITOT 4.4*  --   --  4.2* 4.7*   ------------------------------------------------------------------------------------------------------------------ estimated creatinine clearance is 132.7 mL/min (by C-G formula based on SCr of 0.5 mg/dL (L)). ------------------------------------------------------------------------------------------------------------------ No results for input(s): HGBA1C in the last 72 hours. ------------------------------------------------------------------------------------------------------------------ No results for input(s): CHOL, HDL, LDLCALC, TRIG, CHOLHDL, LDLDIRECT in the last 72 hours. ------------------------------------------------------------------------------------------------------------------ No results for input(s): TSH, T4TOTAL, T3FREE, THYROIDAB in the last 72 hours.  Invalid input(s):  FREET3 ------------------------------------------------------------------------------------------------------------------ No results for input(s): VITAMINB12, FOLATE, FERRITIN, TIBC, IRON, RETICCTPCT in the last 72 hours.  Coagulation profile  Recent Labs Lab 12/27/15 0059  INR 1.23    No results for input(s): DDIMER in the last 72 hours.  Cardiac Enzymes  Recent Labs Lab 12/27/15 1159 12/27/15 1654 12/27/15 2252  TROPONINI <0.03 <0.03 <0.03   ------------------------------------------------------------------------------------------------------------------ Invalid input(s): POCBNP   CBG:  Recent Labs Lab 12/27/15 0548 12/27/15 1140 12/27/15 1610 12/27/15 2100 12/28/15 0604  GLUCAP 91 129* 137* 203* 116*       Studies: Ct Head Wo Contrast  Result Date: 12/27/2015 CLINICAL DATA:  50 year old male with history of trauma from a fall. History of seizures. Headache. EXAM: CT HEAD WITHOUT CONTRAST TECHNIQUE: Contiguous axial images were obtained from the base of the skull through the vertex without intravenous contrast. COMPARISON:  Head CT 09/29/2015. FINDINGS: Brain: No evidence of acute infarction, hemorrhage, hydrocephalus, extra-axial collection or mass lesion/mass effect. Vascular: No hyperdense vessel or unexpected calcification. Skull: Normal. Negative for fracture or focal lesion. Sinuses/Orbits: No acute finding. Other: None. IMPRESSION: 1. No acute intracranial abnormalities. 2. Normal appearance of the brain. Electronically Signed   By: [name] [name] M.D.   On: 12/27/2015 12:39   Dg Chest Port 1 View  Result Date: 12/27/2015 CLINICAL DATA:  Chest and abdominal pain. Right emesis x3. Patient drank heavily yesterday. EXAM: PORTABLE CHEST 1 VIEW COMPARISON:  11/23/2015 FINDINGS: The cardiac silhouette appears enlarged. There is mild pulmonary vascular congestion. No pneumonic consolidation, effusion or pneumothorax. No suspicious osseous abnormality.  IMPRESSION: Cardiomegaly suspected along for portable technique. Vascular congestion. No definite pneumonic consolidation. Electronically Signed   By: Tollie Eth M.D.   On: 12/27/2015 03:00   Dg Abd Portable 2v  Result Date: 12/27/2015 CLINICAL DATA:  Hematemesis, alcohol abuse and cirrhosis. Esophageal varices. EXAM: PORTABLE ABDOMEN - 2 VIEW COMPARISON:  None. FINDINGS: The patient is status post TIPS. Bowel gas pattern is unremarkable. No acute osseous abnormality is seen. Surgical clips are noted in the right upper quadrant. No apparent free air. IMPRESSION: Unremarkable bowel gas pattern.  Status post TIPS. Electronically Signed   By: Tollie Eth M.D.   On: 12/27/2015 03:03      Lab Results  Component Value Date   HGBA1C 6.2 (H) 09/30/2015   HGBA1C 7.2 (H) 11/05/2014   HGBA1C 7.0 (H) 06/11/2013   Lab Results  Component Value Date   LDLCALC 98 09/30/2015   CREATININE 0.50 (L) 12/28/2015       Scheduled Meds: . folic acid  1 mg Oral Daily  . multivitamin with minerals  1 tablet Oral Daily  . pantoprazole  40 mg Intravenous Q12H  . potassium chloride  40 mEq Oral BID  . thiamine  100 mg Oral Daily   Or  . thiamine  100 mg Intravenous Daily   Continuous Infusions:    LOS: 1 day    Time spent: >  30 MINS    [facility] PaBROL,Aleksey Newbern  [facility] Pager [phone]. If 7PM-7AM, please contact night-coverage at [email or web address], password [email or web address] 12/28/2015, 8:20 AM  LOS: 1 day

## 2015-12-29 DIAGNOSIS — K7010 Alcoholic hepatitis without ascites: Secondary | ICD-10-CM

## 2015-12-29 DIAGNOSIS — K7682 Hepatic encephalopathy: Secondary | ICD-10-CM

## 2015-12-29 DIAGNOSIS — K7290 Hepatic failure, unspecified without coma: Secondary | ICD-10-CM

## 2015-12-29 LAB — GLUCOSE, CAPILLARY
GLUCOSE-CAPILLARY: 143 mg/dL — AB (ref 65–99)
GLUCOSE-CAPILLARY: 170 mg/dL — AB (ref 65–99)
Glucose-Capillary: 154 mg/dL — ABNORMAL HIGH (ref 65–99)
Glucose-Capillary: 156 mg/dL — ABNORMAL HIGH (ref 65–99)

## 2015-12-29 LAB — AFP TUMOR MARKER: AFP-Tumor Marker: 7.3 ng/mL (ref 0.0–8.3)

## 2015-12-29 LAB — CBC
HCT: 44.3 % (ref 39.0–52.0)
HEMOGLOBIN: 15.6 g/dL (ref 13.0–17.0)
MCH: 34.7 pg — ABNORMAL HIGH (ref 26.0–34.0)
MCHC: 35.2 g/dL (ref 30.0–36.0)
MCV: 98.4 fL (ref 78.0–100.0)
Platelets: 17 10*3/uL — CL (ref 150–400)
RBC: 4.5 MIL/uL (ref 4.22–5.81)
RDW: 12.1 % (ref 11.5–15.5)
WBC: 2.6 10*3/uL — ABNORMAL LOW (ref 4.0–10.5)

## 2015-12-29 LAB — COMPREHENSIVE METABOLIC PANEL
ALK PHOS: 84 U/L (ref 38–126)
ALT: 102 U/L — ABNORMAL HIGH (ref 17–63)
ANION GAP: 9 (ref 5–15)
AST: 205 U/L — ABNORMAL HIGH (ref 15–41)
Albumin: 3.4 g/dL — ABNORMAL LOW (ref 3.5–5.0)
BILIRUBIN TOTAL: 8.3 mg/dL — AB (ref 0.3–1.2)
BUN: <5 mg/dL — ABNORMAL LOW (ref 6–20)
CALCIUM: 8.6 mg/dL — AB (ref 8.9–10.3)
CO2: 26 mmol/L (ref 22–32)
Chloride: 103 mmol/L (ref 101–111)
Creatinine, Ser: 0.48 mg/dL — ABNORMAL LOW (ref 0.61–1.24)
GFR calc non Af Amer: >60 mL/min (ref >60)
Glucose, Bld: 151 mg/dL — ABNORMAL HIGH (ref 65–99)
Potassium: 3.5 mmol/L (ref 3.5–5.1)
SODIUM: 138 mmol/L (ref 135–145)
TOTAL PROTEIN: 7 g/dL (ref 6.5–8.1)

## 2015-12-29 LAB — PROTIME-INR
INR: 1.42
Prothrombin Time: 17.5 seconds — ABNORMAL HIGH (ref 11.4–15.2)

## 2015-12-29 LAB — AMMONIA: AMMONIA: 179 umol/L — AB (ref 9–35)

## 2015-12-29 MED ORDER — PREDNISOLONE 5 MG PO TABS
40.0000 mg | ORAL_TABLET | Freq: Every day | ORAL | Status: DC
  Filled 2015-12-29: qty 8

## 2015-12-29 MED ORDER — PREDNISOLONE 5 MG PO TABS: 40.0000 mg | ORAL_TABLET | Freq: Every day | ORAL | Status: DC

## 2015-12-29 MED ORDER — PREDNISOLONE 5 MG PO TABS
40.0000 mg | ORAL_TABLET | Freq: Every day | ORAL | Status: DC
  Administered 2015-12-29 – 2015-12-31 (×3): 40 mg via ORAL
  Filled 2015-12-29 (×3): qty 8

## 2015-12-29 NOTE — Evaluation (Signed)
Occupational Therapy Evaluation Patient Details Name: [name] [name] MRN: [id number] DOB: [birth year] Today's Date: 12/29/2015    History of Present Illness [name] [name] is a 50 y.o. male with medical history significant of esophageal varices, cirrhosis, ongoing EtOH abuse.  Patient presents to the ED with c/o 3 episodes of vomiting dark red blood at home per patient and family members who are with him at time of presentation to ED.  There is also some report of passing one stool of BRB. +DTs   Clinical Impression   Pt reports having had assistance at times for LB dressing, otherwise was independent prior to admission. Pt presents with generalized weakness, blurred vision, poor balance and impaired cognition. Pt is a high fall risk. At this point he will require supervision at d/c. Will follow acutely.    Follow Up Recommendations  Supervision/Assistance - 24 hour    Equipment Recommendations       Recommendations for Other Services       Precautions / Restrictions Precautions Precautions: Fall Restrictions Weight Bearing Restrictions: No      Mobility Bed Mobility Overal bed mobility: Needs Assistance Bed Mobility: Supine to Sit     Supine to sit: Min assist     General bed mobility comments: Used rails; very slow moving and inefficient  Transfers Overall transfer level: Needs assistance Equipment used: Rolling walker (2 wheeled) Transfers: Sit to/from Stand Sit to Stand: Min assist;+2 safety/equipment Stand pivot transfers: Min assist       General transfer comment: steadying assist     Balance Overall balance assessment: Needs assistance Sitting-balance support: No upper extremity supported;Feet supported Sitting balance-Leahy Scale: Good     Standing balance support: Bilateral upper extremity supported Standing balance-Leahy Scale: Poor                              ADL Overall ADL's : Needs assistance/impaired Eating/Feeding: Set  up;Sitting Eating/Feeding Details (indicate cue type and reason): drinking Grooming: Wash/dry face;Sitting;Minimal assistance Grooming Details (indicate cue type and reason): decreased thoroughness Upper Body Bathing: Moderate assistance;Sitting   Lower Body Bathing: Maximal assistance;Sit to/from stand   Upper Body Dressing : Minimal assistance;Sitting   Lower Body Dressing: Maximal assistance;Sit to/from stand   Toilet Transfer: Minimal assistance;Ambulation;RW Toilet Transfer Details (indicate cue type and reason): simulated to chair         Functional mobility during ADLs: Minimal assistance;Rolling walker;Cueing for safety;Cueing for sequencing       Vision     Perception     Praxis      Pertinent Vitals/Pain Pain Assessment: Faces Faces Pain Scale: Hurts little more Pain Location: chest Pain Descriptors / Indicators: Stabbing Pain Intervention(s): Monitored during session;Limited activity within patient's tolerance (RN notified)     Hand Dominance Right   Extremity/Trunk Assessment Upper Extremity Assessment Upper Extremity Assessment: Generalized weakness   Lower Extremity Assessment Lower Extremity Assessment: Defer to PT evaluation       Communication Communication Communication: Prefers language other than [place] (spanish, interpreter used)   Cognition Arousal/Alertness: Awake/alert Behavior During Therapy: WFL for tasks assessed/performed Overall Cognitive Status: Impaired/Different from baseline Area of Impairment: Orientation;Memory;Following commands;Safety/judgement;Problem solving Orientation Level: Time   Memory: Decreased short-term memory Following Commands: Follows one step commands with increased time Safety/Judgement: Decreased awareness of safety;Decreased awareness of deficits   Problem Solving: Slow processing;Requires verbal cues;Requires tactile cues Comments: pt in urine and did not attempt to call for help   General  Comments       Exercises       Shoulder Instructions      Home Living Family/patient expects to be discharged to:: Private residence Living Arrangements: Other relatives (sister) Available Help at Discharge: Family;Available PRN/intermittently Type of Home: House Home Access: Stairs to enter Entergy CorporationEntrance Stairs-Number of Steps: 6 Entrance Stairs-Rails: Right;Left Home Layout: Two level (stays in living room, full bath on first floor) Alternate Level Stairs-Number of Steps: 12 Alternate Level Stairs-Rails: Right Bathroom Shower/Tub: Chief Strategy OfficerTub/shower unit   Bathroom Toilet: Standard     Home Equipment: None   Additional Comments: interpreter called his daughter; [place]; she will call when family decides where he will go on d/c      Prior Functioning/Environment Level of Independence: Needs assistance  Gait / Transfers Assistance Needed: "I fall alot. I don't know why"  ADL's / Homemaking Assistance Needed: sister assisted with LB dressing sometimes            OT Problem List: Decreased strength;Decreased activity tolerance;Impaired balance (sitting and/or standing);Decreased safety awareness;Decreased knowledge of use of DME or AE;Decreased cognition;Impaired vision/perception;Impaired UE functional use;Pain   OT Treatment/Interventions: Self-care/ADL training;DME and/or AE instruction;Therapeutic activities;Cognitive remediation/compensation;Patient/family education;Balance training    OT Goals(Current goals can be found in the care plan section) Acute Rehab OT Goals Patient Stated Goal: did not state OT Goal Formulation: With patient Time For Goal Achievement: 01/05/16 Potential to Achieve Goals: Good ADL Goals Pt Will Perform Grooming: with supervision;standing Pt Will Transfer to Toilet: with supervision;ambulating;regular height toilet Pt Will Perform Toileting - Clothing Manipulation and hygiene: with supervision;sit to/from stand Pt Will  Perform Tub/Shower Transfer: Tub transfer;with supervision;ambulating;rolling walker Additional ADL Goal #1: Pt will identify items needed to perform ADL and gather with supervision and use of RW as needed. Additional ADL Goal #2: Pt will perform sponge bathing and dressing sit>stand at sink with supervision.  OT Frequency: Min 2X/week   Barriers to D/C: Decreased caregiver support          Co-evaluation PT/OT/SLP Co-Evaluation/Treatment: Yes Reason for Co-Treatment: For patient/therapist safety (and for interpreter) PT goals addressed during session: Mobility/safety with mobility;Balance;Proper use of DME OT goals addressed during session: ADL's and self-care      End of Session Equipment Utilized During Treatment: Rolling walker Nurse Communication: Mobility status (chest pain)  Activity Tolerance: Patient limited by fatigue Patient left: in chair;with call bell/phone within reach;with nursing/sitter in room   Time: 1128-1150 OT Time Calculation (min): 22 min Charges:  OT General Charges $OT Visit: 1 Procedure OT Evaluation $OT Eval Moderate Complexity: 1 Procedure G-Codes:    Evern BioMayberry, Toni Hoffmeister Lynn 12/29/2015, 2:24 PM  [phone]

## 2015-12-29 NOTE — Progress Notes (Signed)
Daily Rounding Note  12/29/2015, 10:56 AM  LOS: 2 days   SUBJECTIVE:   Chief complaint:   Dr Susie Cassette has called GI back as pt has rising LFTs.  Pt c/o not sleeping well.  T bili from 4.7 to 8.3.  Transaminases stable/better. No nausea, no emesis.   Tolerating clears. No BM overnight.    OBJECTIVE:         Vital signs in last 24 hours:    Temp:  [98.3 F (36.8 C)-98.9 F (37.2 C)] 98.9 F (37.2 C) (10/23 0446) Pulse Rate:  [64-92] 92 (10/23 0807) Resp:  [18-20] 18 (10/23 0446) BP: (146-164)/(72-96) 164/96 (10/23 0807) SpO2:  [93 %-95 %] 95 % (10/23 0446) Weight:  [98.2 kg (216 lb 9.6 oz)] 98.2 kg (216 lb 9.6 oz) (10/23 0446) Last BM Date: 12/28/15 Filed Weights   12/27/15 0514 12/28/15 0500 12/29/15 0446  Weight: 116.1 kg (255 lb 14.4 oz) 101.7 kg (224 lb 1.6 oz) 98.2 kg (216 lb 9.6 oz)   General: ill, sallow, scleral icterus   Heart: RRR Chest: clear bil.  No cough or dyspnea Abdomen: soft, diffusely tender but no g/r.  Active BS, normal character.   Extremities: no CCE Neuro/Psych:  Oriented to place, to # of days in hospital.  Follows commands.  Tremor in hands, ? Asterixis vs tremor.   Intake/Output from previous day: 10/22 0701 - 10/23 0700 In: 1680 [P.O.:1680] Out: 3550 [Urine:3050; Emesis/NG output:500]  Intake/Output this shift: Total I/O In: 120 [P.O.:120] Out: -   Lab Results:  Recent Labs  12/27/15 0732 12/28/15 0317 12/29/15 0349  WBC 2.9* 2.2* 2.6*  HGB 15.9 13.8 15.6  HCT 45.7 39.9 44.3  PLT 30* 20* 17*   BMET  Recent Labs  12/27/15 0732 12/28/15 0317 12/29/15 0349  NA 144 141 138  K 3.7 3.1* 3.5  CL 108 103 103  CO2 22 30 26   GLUCOSE 80 123* 151*  BUN <5* <5* <5*  CREATININE 0.51* 0.50* 0.48*  CALCIUM 7.4* 7.4* 8.6*   LFT  Recent Labs  12/27/15 1021 12/28/15 0317 12/29/15 0349  PROT 6.2* 6.2* 7.0  ALBUMIN 3.1* 2.9* 3.4*  AST 272* 238* 205*  ALT 112* 102* 102*    ALKPHOS 72 73 84  BILITOT 4.2* 4.7* 8.3*  BILIDIR 1.9*  --   --   IBILI 2.3*  --   --    PT/INR  Recent Labs  12/27/15 0059  LABPROT 15.5*  INR 1.23   Hepatitis Panel No results for input(s): HEPBSAG, HCVAB, HEPAIGM, HEPBIGM in the last 72 hours.  Studies/Results: Ct Head Wo Contrast  Result Date: 12/27/2015 CLINICAL DATA:  50 year old male with history of trauma from a fall. History of seizures. Headache. EXAM: CT HEAD WITHOUT CONTRAST TECHNIQUE: Contiguous axial images were obtained from the base of the skull through the vertex without intravenous contrast. COMPARISON:  Head CT 09/29/2015. FINDINGS: Brain: No evidence of acute infarction, hemorrhage, hydrocephalus, extra-axial collection or mass lesion/mass effect. Vascular: No hyperdense vessel or unexpected calcification. Skull: Normal. Negative for fracture or focal lesion. Sinuses/Orbits: No acute finding. Other: None. IMPRESSION: 1. No acute intracranial abnormalities. 2. Normal appearance of the brain. Electronically Signed   By: Trudie Reed M.D.   On: 12/27/2015 12:39    Scheduled Meds: . folic acid  1 mg Oral Daily  . lactulose  20 g Oral TID  . multivitamin with minerals  1 tablet Oral Daily  . pantoprazole  40 mg Intravenous Q12H  . potassium chloride  40 mEq Oral BID  . thiamine  100 mg Oral Daily   Or  . thiamine  100 mg Intravenous Daily   Continuous Infusions:  PRN Meds:.LORazepam **OR** LORazepam, nitroGLYCERIN, ondansetron (ZOFRAN) IV   ASSESMENT:   *  Alcoholic liver dz with cirrhosis and acute etoh hepatitis. HCV and Hep B serologies neg in 11/2014.  AFP tumor marker in progress.   *  Ongoing ETOH abuse, intoxicated at arrival.    *  HE, elevated ammonia, started on Lactulose. Currently slow but not confused.   *  Hematemesis. Felt to be due to epistaxis after drunken fall and nasal trauma.  No recurrence. Hgb stable.   [facility]    [facility]    *  Severe thrombocytopenia. Got transfused platelets at arrival but platelets back to 17 K.   Coags ok 10/21.    PLAN   *  Supportive care.  Follow LFTs, platelets, coags, ammonia.   *  Carb mod diet.     Jennye MoccasinSarah Gribbin  12/29/2015, 10:56 AM Pager: [phone]  I have discussed the case with the PA, and that is the plan I formulated. I personally interviewed and examined the patient.  CC: elevated LFTs  Patient's LFts have been rising. He c/o fatigue Mentally sluggish and tremulous No overt GI bleeding, renal function stable, no apparent infection.  He has acute EtOH hepatitis on top of EtOH cirrhosis.  Discriminant function 34,so I will begin prednisolone 40 mg once daily.  Assuming he recovers from this episode,it seems that outpatient follow up will be a challenge for him. We will follow every other day.  Please get INR and LFTs every other day     Charlie PitterHenry L Danis III Pager [phone]  Mon-Fri 8a-5p [phone] after 5p, weekends, holidays

## 2015-12-29 NOTE — Progress Notes (Addendum)
Physical Therapy Treatment Patient Details Name: [name] [name] MRN: [id number] DOB: February 07, 1966 Today's Date: 12/29/2015    History of Present Illness [name] [name] is a 50 y.o. male with medical history significant of esophageal varices, cirrhosis, ongoing EtOH abuse.  Patient presents to the ED with c/o 3 episodes of vomiting dark red blood at home per patient and family members who are with him at time of presentation to ED.  There is also some report of passing one stool of BRB. +DTs    PT Comments    Spanish interpreter, Darrelyn Hillock, assisted with session. Patient remains confused (could not recall his wife's name, did not believe today is Monday). Continues to require 2 person assist to ambulate safely (shakey and unpredictable). Interpreter assisted with discharge planning and called pt's daughter, [name] [name]. She lives in [place] and would like for him to come live with her, however she works and he would be alone during days. She plans to discuss with family where he will go.   Follow Up Recommendations  Supervision/Assistance - 24 hour; Home health PT (Currently could benefit & don't think HHPT will be affordable/covered; hopeful balance/gait will improve as pt stabilizes medically)     Equipment Recommendations  Rolling walker with 5" wheels    Recommendations for Other Services       Precautions / Restrictions Precautions Precautions: Fall Restrictions Weight Bearing Restrictions: No    Mobility  Bed Mobility Overal bed mobility: Needs Assistance Bed Mobility: Supine to Sit     Supine to sit: Min assist     General bed mobility comments: Used rails; very slow moving and inefficient  Transfers Overall transfer level: Needs assistance Equipment used: Rolling walker (2 wheeled) Transfers: Sit to/from [employer] Sit to Stand: Min assist;+2 safety/equipment Stand pivot transfers: Min assist       General transfer comment: Tremors (UE, LE); reported  dizziness; on return to bed, he initiated sitting too soon and Rt hip landed on bedrail  Ambulation/Gait Ambulation/Gait assistance: Min assist;+2 safety/equipment Ambulation Distance (Feet): 3 Feet Assistive device: Rolling walker (2 wheeled) Gait Pattern/deviations: Decreased step length - right;Decreased stride length;Shuffle;Trunk flexed     General Gait Details: dizzy and once seated refused to walk a second time   Careers information officer    Modified Rankin (Stroke Patients Only)       Balance Overall balance assessment: Needs assistance Sitting-balance support: No upper extremity supported;Feet supported Sitting balance-Leahy Scale: Good     Standing balance support: Bilateral upper extremity supported Standing balance-Leahy Scale: Poor                      Cognition Arousal/Alertness: Awake/alert Behavior During Therapy: WFL for tasks assessed/performed Overall Cognitive Status: No family/caregiver present to determine baseline cognitive functioning Area of Impairment: Orientation;Memory;Following commands;Safety/judgement;Problem solving Orientation Level: Time ("Wednesday" (on MOnday))     Following Commands: Follows one step commands with increased time Safety/Judgement: Decreased awareness of safety;Decreased awareness of deficits   Problem Solving: Slow processing;Requires verbal cues;Requires tactile cues General Comments: asked interpreter for a letter to allow his wife to travel from [place] and could not recall her name for ~30 seconds    Exercises      General Comments General comments (skin integrity, edema, etc.): Graciella, Spanish interpreter present      Pertinent Vitals/Pain Pain Assessment: Faces Faces Pain Scale: Hurts little more Pain Location: nose, chest (stabbing sensation) Pain Descriptors /  Indicators: Stabbing Pain Intervention(s): Limited activity within patient's tolerance;Monitored during  session;Other (comment) (RN made aware)    Home Living Family/patient expects to be discharged to:: Private residence Living Arrangements:  (unsure--daughter to talk to family)   Type of Home:  (sister's)     Home Layout: Other (Comment);Two level ("stays in the living room" full bath on first floor) Home Equipment: None Additional Comments: interpreter called his daughter; she [place] in [place] and works days; she will call when family decides where he will go on d/c    Prior Function Level of Independence: Needs assistance  Gait / Transfers Assistance Needed: "I fall alot. I don't know why"        PT Goals (current goals can now be found in the care plan section) Acute Rehab PT Goals Patient Stated Goal: did not state (agrees to PT/OT goals) Time For Goal Achievement: 01/11/16 Progress towards PT goals: Progressing toward goals    Frequency    Min 3X/week      PT Plan Current plan remains appropriate    Co-evaluation PT/OT/SLP Co-Evaluation/Treatment: Yes Reason for Co-Treatment: For patient/therapist safety;Other (comment) (for use of in room interpreter ) PT goals addressed during session: Mobility/safety with mobility;Balance;Proper use of DME       End of Session Equipment Utilized During Treatment: Gait belt Activity Tolerance: Patient limited by fatigue;Treatment limited secondary to medical complications (Comment) (dizziness) Patient left: with call bell/phone within reach;in bed;with bed alarm set (attempted chair; pt set off alarm after 2 minutes )     Time: [id number] PT Time Calculation (min) (ACUTE ONLY): 47 min  Charges:  $Therapeutic Activity: 23-37 mins                    G Codes:      Aubrei Bouchie 12/29/2015, 12:42 PM Pager [phone]

## 2015-12-29 NOTE — Evaluation (Signed)
Clinical/Bedside Swallow Evaluation Patient Details  Name: [name] [name] MRN: [id number] Date of Birth: [birth year]  Today's Date: 12/29/2015 Time: SLP Start Time (ACUTE ONLY): 0930 SLP Stop Time (ACUTE ONLY): 0948 SLP Time Calculation (min) (ACUTE ONLY): 18 min  Past Medical History:  Past Medical History:  Diagnosis Date  . Alcohol abuse   . Cirrhosis (HCC)   . Diabetes mellitus without complication (HCC)   . GERD (gastroesophageal reflux disease)   . H/O hiatal hernia   . Headache(784.0)   . No pertinent past medical history   . Seizures (HCC)   . Shortness of breath   . Sleep apnea    Past Surgical History:  Past Surgical History:  Procedure Laterality Date  . APPENDECTOMY    . NO PAST SURGERIES    . RADIOLOGY WITH ANESTHESIA  01/30/2012   Procedure: RADIOLOGY WITH ANESTHESIA;  Surgeon: Casimiro NeedleMichael T. Miles CostainShick, MD;  Location: [facility] OR;  Service: Radiology;  Laterality: N/A;   HPI:  [name] LacyDavid Martinezis a 50 y.o.malewith medical history significant of esophageal varices, cirrhosis, ongoing EtOH abuse. Patient presented to the ED with c/o 3 episodes of vomiting dark red blood, syncopal episode.    Assessment / Plan / Recommendation Clinical Impression  Patient presents with a functional oropharyngeal swallow without overt s/s of aspiration. Patient c/o mid-left sided chest pain, mostly in between meals. SLP suspects this is related to esophageal deficits. Discussed with patient and reported to RN. No SLP f/u indicated. Signing off.     Aspiration Risk  Mild aspiration risk    Diet Recommendation Regular;Thin liquid (when ready to advance)   Liquid Administration via: Straw;Cup Medication Administration: Whole meds with liquid Supervision: Patient able to self feed Compensations: Slow rate;Small sips/bites Postural Changes: Remain upright for at least 30 minutes after po intake;Seated upright at 90 degrees    Other  Recommendations Oral Care Recommendations: Oral care BID    Follow up Recommendations None                 Swallow Study   General HPI: [name] LacyDavid Martinezis a 50 y.o.malewith medical history significant of esophageal varices, cirrhosis, ongoing EtOH abuse. Patient presented to the ED with c/o 3 episodes of vomiting dark red blood, syncopal episode.  Type of Study: Bedside Swallow Evaluation Previous Swallow Assessment: seen in 2015 at bedside, indicated normal swallowing function Diet Prior to this Study: Thin liquids (clear liquids) Temperature Spikes Noted: No Respiratory Status: Nasal cannula History of Recent Intubation: No Behavior/Cognition: Alert;Cooperative;Pleasant mood Oral Cavity Assessment: Within Functional Limits Oral Care Completed by SLP: No Oral Cavity - Dentition: Adequate natural dentition Vision: Functional for self-feeding Self-Feeding Abilities: Able to feed self Patient Positioning: Upright in bed Baseline Vocal Quality: Normal Volitional Cough: Strong Volitional Swallow: Able to elicit    Oral/Motor/Sensory Function Overall Oral Motor/Sensory Function: Within functional limits   Ice Chips Ice chips: Not tested   Thin Liquid Thin Liquid: Within functional limits Presentation: Cup;Self Fed;Straw    Nectar Thick Nectar Thick Liquid: Not tested   Honey Thick Honey Thick Liquid: Not tested   Puree Puree: Within functional limits Presentation: Self Fed;Spoon   Solid   GO   Solid: Within functional limits Presentation: Self Fed       Taedyn Glasscock MA, CCC-SLP [phone]  Alton Tremblay Meryl 12/29/2015,10:01 AM

## 2015-12-29 NOTE — Progress Notes (Signed)
[facility] PROGRESS NOTE  [name] [name]   PCP: [name] LewandowskyJEGEDE, OLUGBEMIGA, MD     Assessment/Plan: Principal Problem:   Hematemesis Active Problems:   Alcohol abuse   Alcoholic cirrhosis (HCC)   Thrombocytopenia, secondary   Esophageal varices in alcoholic cirrhosis (HCC)   Fall   Encephalopathy, hepatic (HCC)    [name] LacyDavid Martinezis a 50 y.o.malewith medical history significant of esophageal varices, cirrhosis, ongoing EtOH abuse. Patient presents to the ED with c/o 3 episodes of vomiting dark red blood at home per patient and family members who are with him at time of presentation to ED. There is also some report of passing one stool of BRB. Has been drinking heavily today. Denies melena. Had syncopal episode today at home.  ED Course:HGB 17.6, guiac negative, no vomiting thus far in ED. Given zofran for nausea, octreotide and PPI gtt are started, platelets 24 so he is transfused platelets as well. EtOH level 475  Assessment and plan Hematemesis vs  Epistaxis vs   hemoptysis - patient is a very poor historian Initially there was concern for variceal bleeding   ,adamantly denies any hematemesis, or melena Chest x-ray shows cardiomegaly with vascular congestion, no pneumonia  Started on Protonix and octreotide, octreotide drip has been discontinued Status post 1 unit of platelets, Hemoglobin slowly trending down Initial INR 1.23 [place] consulted to assess need for EGD.history of variceal bleeding s/p TIPS  Platelets continue to trend down, no further bleeding thus  far  Fall due to hepatic encephalopathy Apparently the patient fell, need to rule out intracranial bleeding  Ammonia 202 >179, now on lactulose, continue to monitor ammonia levels   Thrombocytopenia-likely secondary to EtOH abuse Continue to monitor without further transfusions Platelets continue to trend downwards  Alcohol  abuse Previously screen for depression on a recent admission, does not appear to be suicidal or homicidal Continue CIWA protocol Replete electrolytes  Transaminitis-AST is greater than ALT consistent with alcohol abuse, bilirubin trending up Monitor closely Viral hepatitis studies negative in 2013. HCV and Hep B serologies neg in 11/2014.  Requested GI to evaluate for Alcoholic liver dz with cirrhosis and acute etoh hepatitis   Alcoholic/metabolic encephalopathy/delirium tremors-on CIWA protocol,    alpha-fetoprotein pending. CT of the brain negative Started scheduled dose of Valium, but discontinued as this could exacerbate his hepatic encephalopathy CIWA protocol  Hypokalemia hypomagnesemia-repleted   DVT prophylaxsis SCDs   Code Status:  Full code      Family Communication: Discussed in detail with the patient, all imaging results, lab results explained to the patient   Disposition Plan:  PT OT eval, anticipate discharge in 2-3 days      Consultants:  GI  Procedures:  None    Antibiotics: Anti-infectives    None         HPI/Subjective:  Less  Shaky today, more awake  Objective: Vitals:   12/29/15 0000 12/29/15 0446 12/29/15 0702 12/29/15 0807  BP:  (!) 160/88 (!) 156/85 (!) 164/96  Pulse: 64 92 86 92  Resp:  18    Temp:  98.9 F (37.2 C)    TempSrc:  Oral    SpO2:  95%    Weight:  98.2 kg (216 lb 9.6 oz)      Intake/Output Summary (Last 24 hours) at 12/29/15 1043 Last data filed at 12/29/15 0900  Gross per 24 hour  Intake  1200 ml  Output             2850 ml  Net            -1650 ml  Will  Exam:  Examination:  General exam: Appears calm and comfortable  Respiratory system: Clear to auscultation. Respiratory effort normal. Cardiovascular system: S1 & S2 heard, RRR. No JVD, murmurs, rubs, gallops or clicks. No pedal edema. Gastrointestinal system: Abdomen is nondistended, soft and nontender. No organomegaly or masses felt.  Normal bowel sounds heard. Central nervous system: Alert and oriented. No focal neurological deficits. Extremities: Symmetric 5 x 5 power. Skin: No rashes, lesions or ulcers Psychiatry: Judgement and insight appear normal. Mood & affect appropriate.     Data Reviewed: I have personally reviewed following labs and imaging studies  Micro Results No results found for this or any previous visit (from the past 240 hour(s)).  Radiology Reports Ct Head Wo Contrast  Result Date: [name] CLINICAL DATA:  50 year old male with history of trauma from a fall. History of seizures. Headache. EXAM: CT HEAD WITHOUT CONTRAST TECHNIQUE: Contiguous axial images were obtained from the base of the skull through the vertex without intravenous contrast. COMPARISON:  Head CT 09/29/2015. FINDINGS: Brain: No evidence of acute infarction, hemorrhage, hydrocephalus, extra-axial collection or mass lesion/mass effect. Vascular: No hyperdense vessel or unexpected calcification. Skull: Normal. Negative for fracture or focal lesion. Sinuses/Orbits: No acute finding. Other: None. IMPRESSION: 1. No acute intracranial abnormalities. 2. Normal appearance of the brain. Electronically Signed   By: Trudie Reed M.D.   On: [name] 12:39   Dg Chest Port 1 View  Result Date: [name] CLINICAL DATA:  Chest and abdominal pain. Right emesis x3. Patient drank heavily yesterday. EXAM: PORTABLE CHEST 1 VIEW COMPARISON:  11/23/2015 FINDINGS: The cardiac silhouette appears enlarged. There is mild pulmonary vascular congestion. No pneumonic consolidation, effusion or pneumothorax. No suspicious osseous abnormality. IMPRESSION: Cardiomegaly suspected along for portable technique. Vascular congestion. No definite pneumonic consolidation. Electronically Signed   By: Tollie Eth M.D.   On: [name] 03:00   Dg Abd Portable 2v  Result Date: [name] CLINICAL DATA:  Hematemesis, alcohol abuse and cirrhosis. Esophageal varices. EXAM:  PORTABLE ABDOMEN - 2 VIEW COMPARISON:  None. FINDINGS: The patient is status post TIPS. Bowel gas pattern is unremarkable. No acute osseous abnormality is seen. Surgical clips are noted in the right upper quadrant. No apparent free air. IMPRESSION: Unremarkable bowel gas pattern.  Status post TIPS. Electronically Signed   By: Tollie Eth M.D.   On: [name] 03:03     CBC  Recent Labs Lab 12/27/15 0051 12/27/15 0110 12/27/15 0732 12/28/15 0317 12/29/15 0349  WBC 3.4*  --  2.9* 2.2* 2.6*  HGB 17.6* 17.3* 15.9 13.8 15.6  HCT 50.3 51.0 45.7 39.9 44.3  PLT 24*  --  30* 20* 17*  MCV 98.2  --  100.0 99.0 98.4  MCH 34.4*  --  34.8* 34.2* 34.7*  MCHC 35.0  --  34.8 34.6 35.2  RDW 12.6  --  12.8 12.3 12.1    Chemistries   Recent Labs Lab 12/27/15 0051 12/27/15 0110 12/27/15 0732 12/27/15 1021 12/28/15 0317 12/28/15 0912 12/29/15 0349  NA 141 144 144  --  141  --  138  K 3.3* 3.4* 3.7  --  3.1*  --  3.5  CL 102 102 108  --  103  --  103  CO2 27  --  22  --  30  --  26  GLUCOSE 132* 129* 80  --  123*  --  151*  BUN <5* <3* <5*  --  <5*  --  <5*  CREATININE 0.46* 1.00 0.51*  --  0.50*  --  0.48*  CALCIUM 8.6*  --  7.4*  --  7.4*  --  8.6*  MG  --   --   --  1.5*  --  1.5*  --   AST 290*  --   --  272* 238*  --  205*  ALT 127*  --   --  112* 102*  --  102*  ALKPHOS 88  --   --  72 73  --  84  BILITOT 4.4*  --   --  4.2* 4.7*  --  8.3*   ------------------------------------------------------------------------------------------------------------------ estimated creatinine clearance is 130.6 mL/min (by C-G formula based on SCr of 0.48 mg/dL (L)). ------------------------------------------------------------------------------------------------------------------ No results for input(s): HGBA1C in the last 72 hours. ------------------------------------------------------------------------------------------------------------------ No results for input(s): CHOL, HDL, LDLCALC, TRIG,  CHOLHDL, LDLDIRECT in the last 72 hours. ------------------------------------------------------------------------------------------------------------------ No results for input(s): TSH, T4TOTAL, T3FREE, THYROIDAB in the last 72 hours.  Invalid input(s): FREET3 ------------------------------------------------------------------------------------------------------------------ No results for input(s): VITAMINB12, FOLATE, FERRITIN, TIBC, IRON, RETICCTPCT in the last 72 hours.  Coagulation profile  Recent Labs Lab 12/27/15 0059  INR 1.23    No results for input(s): DDIMER in the last 72 hours.  Cardiac Enzymes  Recent Labs Lab 12/27/15 1159 12/27/15 1654 12/27/15 2252  TROPONINI <0.03 <0.03 <0.03   ------------------------------------------------------------------------------------------------------------------ Invalid input(s): POCBNP   CBG:  Recent Labs Lab 12/28/15 0604 12/28/15 1139 12/28/15 1634 12/28/15 2105 12/29/15 0607  GLUCAP 116* 119* 152* 174* 154*       Studies: Ct Head Wo Contrast  Result Date: [name] CLINICAL DATA:  50 year old male with history of trauma from a fall. History of seizures. Headache. EXAM: CT HEAD WITHOUT CONTRAST TECHNIQUE: Contiguous axial images were obtained from the base of the skull through the vertex without intravenous contrast. COMPARISON:  Head CT 09/29/2015. FINDINGS: Brain: No evidence of acute infarction, hemorrhage, hydrocephalus, extra-axial collection or mass lesion/mass effect. Vascular: No hyperdense vessel or unexpected calcification. Skull: Normal. Negative for fracture or focal lesion. Sinuses/Orbits: No acute finding. Other: None. IMPRESSION: 1. No acute intracranial abnormalities. 2. Normal appearance of the brain. Electronically Signed   By: Trudie Reed M.D.   On: [name] 12:39      Lab Results  Component Value Date   HGBA1C 6.2 (H) 09/30/2015   HGBA1C 7.2 (H) 11/05/2014   HGBA1C 7.0 (H) 06/11/2013    Lab Results  Component Value Date   LDLCALC 98 09/30/2015   CREATININE 0.48 (L) 12/29/2015       Scheduled Meds: . folic acid  1 mg Oral Daily  . lactulose  20 g Oral TID  . multivitamin with minerals  1 tablet Oral Daily  . pantoprazole  40 mg Intravenous Q12H  . potassium chloride  40 mEq Oral BID  . thiamine  100 mg Oral Daily   Or  . thiamine  100 mg Intravenous Daily   Continuous Infusions:    LOS: 2 days    Time spent: >30 MINS    [facility]  [facility] Pager [phone]. If 7PM-7AM, please contact night-coverage at [email or web address], password [facility] 12/29/2015, 10:43 AM  LOS: 2 days

## 2015-12-29 NOTE — Progress Notes (Signed)
Patient is complaining of "chest pain." Noted that patient pointed to epigastric area as location of pain. MD notified. No further orders at this time.

## 2015-12-29 NOTE — Progress Notes (Signed)
Interpreter Wyvonnia DuskyGraciela Namihira for Lollie SailsJulie OT and Lyn PT

## 2015-12-30 LAB — CBC
HEMATOCRIT: 48.3 % (ref 39.0–52.0)
Hemoglobin: 16.8 g/dL (ref 13.0–17.0)
MCH: 35.3 pg — AB (ref 26.0–34.0)
MCHC: 34.8 g/dL (ref 30.0–36.0)
MCV: 101.5 fL — ABNORMAL HIGH (ref 78.0–100.0)
Platelets: 31 10*3/uL — ABNORMAL LOW (ref 150–400)
RBC: 4.76 MIL/uL (ref 4.22–5.81)
RDW: 12.4 % (ref 11.5–15.5)
WBC: 2.4 10*3/uL — ABNORMAL LOW (ref 4.0–10.5)

## 2015-12-30 LAB — COMPREHENSIVE METABOLIC PANEL
ALK PHOS: 93 U/L (ref 38–126)
ALT: 98 U/L — AB (ref 17–63)
AST: 161 U/L — ABNORMAL HIGH (ref 15–41)
Albumin: 3.6 g/dL (ref 3.5–5.0)
Anion gap: 12 (ref 5–15)
BUN: <5 mg/dL — ABNORMAL LOW (ref 6–20)
CALCIUM: 8.9 mg/dL (ref 8.9–10.3)
CO2: 19 mmol/L — ABNORMAL LOW (ref 22–32)
CREATININE: 0.47 mg/dL — AB (ref 0.61–1.24)
Chloride: 104 mmol/L (ref 101–111)
Glucose, Bld: 162 mg/dL — ABNORMAL HIGH (ref 65–99)
Potassium: 4.2 mmol/L (ref 3.5–5.1)
Sodium: 135 mmol/L (ref 135–145)
Total Bilirubin: 7 mg/dL — ABNORMAL HIGH (ref 0.3–1.2)
Total Protein: 7.5 g/dL (ref 6.5–8.1)

## 2015-12-30 LAB — GLUCOSE, CAPILLARY
GLUCOSE-CAPILLARY: 160 mg/dL — AB (ref 65–99)
GLUCOSE-CAPILLARY: 233 mg/dL — AB (ref 65–99)
Glucose-Capillary: 172 mg/dL — ABNORMAL HIGH (ref 65–99)
Glucose-Capillary: 186 mg/dL — ABNORMAL HIGH (ref 65–99)

## 2015-12-30 LAB — AMMONIA: Ammonia: 95 umol/L — ABNORMAL HIGH (ref 9–35)

## 2015-12-30 LAB — TROPONIN I
Troponin I: <0.03 ng/mL (ref <0.03)
Troponin I: 0.03 ng/mL (ref <0.03)

## 2015-12-30 LAB — MAGNESIUM: MAGNESIUM: 1.9 mg/dL (ref 1.7–2.4)

## 2015-12-30 LAB — PROTIME-INR
INR: 1.51
PROTHROMBIN TIME: 18.4 s — AB (ref 11.4–15.2)

## 2015-12-30 MED ORDER — TRAMADOL HCL 50 MG PO TABS: 50.0000 mg | ORAL_TABLET | Freq: Four times a day (QID) | ORAL | Status: DC | PRN

## 2015-12-30 MED ORDER — LORAZEPAM 2 MG/ML IJ SOLN
1.0000 mg | Freq: Four times a day (QID) | INTRAMUSCULAR | Status: DC | PRN
  Administered 2015-12-30: 1 mg via INTRAVENOUS
  Filled 2015-12-30: qty 1

## 2015-12-30 MED ORDER — HYDROMORPHONE HCL 1 MG/ML IJ SOLN
1.0000 mg | Freq: Four times a day (QID) | INTRAMUSCULAR | Status: DC | PRN
  Administered 2015-12-30 – 2015-12-31 (×3): 1 mg via INTRAVENOUS
  Filled 2015-12-30 (×3): qty 1

## 2015-12-30 MED ORDER — TRAMADOL HCL 50 MG PO TABS
50.0000 mg | ORAL_TABLET | Freq: Four times a day (QID) | ORAL | Status: AC | PRN
  Administered 2015-12-30 (×2): 50 mg via ORAL
  Filled 2015-12-30 (×2): qty 1

## 2015-12-30 MED ORDER — PANTOPRAZOLE SODIUM 40 MG PO TBEC
40.0000 mg | DELAYED_RELEASE_TABLET | Freq: Two times a day (BID) | ORAL | Status: DC
  Administered 2015-12-30 – 2015-12-31 (×2): 40 mg via ORAL
  Filled 2015-12-30 (×2): qty 1

## 2015-12-30 MED ORDER — LORAZEPAM 1 MG PO TABS: 1.0000 mg | ORAL_TABLET | Freq: Four times a day (QID) | ORAL | Status: DC | PRN

## 2015-12-30 MED ORDER — SUCRALFATE 1 GM/10ML PO SUSP
1.0000 g | Freq: Three times a day (TID) | ORAL | Status: DC
  Administered 2015-12-30 – 2015-12-31 (×3): 1 g via ORAL
  Filled 2015-12-30 (×3): qty 10

## 2015-12-30 MED ORDER — INSULIN ASPART 100 UNIT/ML ~~LOC~~ SOLN
0.0000 [IU] | Freq: Three times a day (TID) | SUBCUTANEOUS | Status: DC
  Administered 2015-12-30 – 2015-12-31 (×2): 3 [IU] via SUBCUTANEOUS

## 2015-12-30 NOTE — Progress Notes (Signed)
Daily Rounding Note  12/30/2015, 9:40 AM  LOS: 3 days   SUBJECTIVE:   Chief complaint: chest pain. Pain present for several days.  No SOB.  No n/v.  Feels poorly  OBJECTIVE:         Vital signs in last 24 hours:    Temp:  [98.6 F (37 C)-98.9 F (37.2 C)] 98.6 F (37 C) (10/24 0844) Pulse Rate:  [86-111] 111 (10/24 0844) Resp:  [18] 18 (10/24 0844) BP: (144-166)/(78-100) 163/96 (10/24 0844) SpO2:  [91 %-99 %] 93 % (10/24 0844) Weight:  [96.8 kg (213 lb 6.4 oz)] 96.8 kg (213 lb 6.4 oz) (10/24 0541) Last BM Date: 12/29/15 Filed Weights   12/28/15 0500 12/29/15 0446 12/30/15 0541  Weight: 101.7 kg (224 lb 1.6 oz) 98.2 kg (216 lb 9.6 oz) 96.8 kg (213 lb 6.4 oz)   General: looks ill and weak   Heart: RRR Chest: clear bil. BS reduced No cough or dyspnea Abdomen: soft, NT, ND  Extremities: some mild edema Neuro/Psych:  Tremulous.  Cooperative.  Alert.  Able to move all limbs and follow commands  Intake/Output from previous day: 10/23 0701 - 10/24 0700 In: 1080 [P.O.:1080] Out: 551 [Urine:550; Stool:1]  Intake/Output this shift: Total I/O In: 360 [P.O.:360] Out: -   Lab Results:  Recent Labs  12/28/15 0317 12/29/15 0349 12/30/15 0346  WBC 2.2* 2.6* 2.4*  HGB 13.8 15.6 16.8  HCT 39.9 44.3 48.3  PLT 20* 17* 31*   BMET  Recent Labs  12/28/15 0317 12/29/15 0349 12/30/15 0346  NA 141 138 135  K 3.1* 3.5 4.2  CL 103 103 104  CO2 30 26 19*  GLUCOSE 123* 151* 162*  BUN <5* <5* <5*  CREATININE 0.50* 0.48* 0.47*  CALCIUM 7.4* 8.6* 8.9   LFT  Recent Labs  12/27/15 1021 12/28/15 0317 12/29/15 0349 12/30/15 0346  PROT 6.2* 6.2* 7.0 7.5  ALBUMIN 3.1* 2.9* 3.4* 3.6  AST 272* 238* 205* 161*  ALT 112* 102* 102* 98*  ALKPHOS 72 73 84 93  BILITOT 4.2* 4.7* 8.3* 7.0*  BILIDIR 1.9*  --   --   --   IBILI 2.3*  --   --   --    PT/INR  Recent Labs  12/29/15 1159 12/30/15 0346  LABPROT 17.5*  18.4*  INR 1.42 1.51   Hepatitis Panel No results for input(s): HEPBSAG, HCVAB, HEPAIGM, HEPBIGM in the last 72 hours.  Studies/Results: No results found.   Scheduled Meds: . folic acid  1 mg Oral Daily  . insulin aspart  0-15 Units Subcutaneous TID WC  . lactulose  20 g Oral TID  . multivitamin with minerals  1 tablet Oral Daily  . pantoprazole  40 mg Oral BID  . prednisoLONE  40 mg Oral Daily  . sucralfate  1 g Oral TID WC & HS  . thiamine  100 mg Oral Daily   Or  . thiamine  100 mg Intravenous Daily   Continuous Infusions:  PRN Meds:.HYDROmorphone (DILAUDID) injection, LORazepam **OR** LORazepam, nitroGLYCERIN, ondansetron (ZOFRAN) IV   ASSESMENT:   *  Alcoholic liver dz with cirrhosis and acute etoh hepatitis. Discriminant function 34, started Prednisolone 10/23.  LFTs improved. HCV and Hep B serologies neg in 11/2014.  AFP tumor marker 7.3, not elevated.   *  Ongoing ETOH abuse, intoxicated at arrival.    *  HE, elevated ammonia, started Lactulose.   *  Hematemesis. Felt to be  due to epistaxis after drunken fall and nasal trauma.  No recurrence. Hgb stable.   [facility]ophageal band[facility] and [facility]   [facility]   *  Severe thrombocytopenia. Transfused platelets at arrival but none since, platelets on improving curve.  Elevated PT, INR normal.     PLAN   *  Stop prednisolone.  Increase Lactulose. Follow up with GI in [place].      Jennye MoccasinSarah Gribbin  12/30/2015, 9:40 AM Pager: [phone]  I have discussed the case with the PA, and that is the plan I formulated. I personally interviewed and examined the patient.  CC: alcoholic hepatitis  His labs started improving less than 24 hrs after starting prednisolone, so I have stopped it.  Also, he is high risk for loss to follow up, making steroid taper risky. Still with chronic HE symptoms, we have increased lactulose to 30 grams three times daily. I feel he can be  discharged.  His underlying liver disease makes him high risk for re-admission.  Charlie PitterHenry L Danis III Pager [phone]  Mon-Fri 8a-5p [phone] after 5p, weekends, holidays

## 2015-12-30 NOTE — Progress Notes (Signed)
[facility] PROGRESS NOTE  Eulogio BearDavid Tweed ZHY:[id number] DOB: 10/19/1965 DOA: 12/27/2015   PCP: Jeanann LewandowskyJEGEDE, OLUGBEMIGA, MD     Assessment/Plan: Principal Problem:   Hematemesis Active Problems:   Alcohol abuse   Alcoholic cirrhosis (HCC)   Thrombocytopenia, secondary   Esophageal varices in alcoholic cirrhosis (HCC)   Fall   Encephalopathy, hepatic (HCC)   Alcoholic hepatitis without ascites    Wilfred LacyDavid Martinezis a 50 y.o.malewith medical history significant of esophageal varices, cirrhosis, ongoing EtOH abuse. Patient presents to the ED with c/o 3 episodes of vomiting dark red blood at home per patient and family members who are with him at time of presentation to ED. There is also some report of passing one stool of BRB. Has been drinking heavily today. Denies melena. Had syncopal episode today at home.  ED Course:HGB 17.6, guiac negative, no vomiting thus far in ED. Given zofran for nausea, octreotide and PPI gtt are started, platelets 24 so he is transfused platelets as well. EtOH level 475  Assessment and plan Hematemesis vs  Epistaxis vs   hemoptysis -  resolved Initially there was concern for variceal bleeding   , patient denied any hematemesis, or melena. Suspected upper airway could be the source of mild epistaxis manifesting as hematemesis Chest x-ray shows cardiomegaly with vascular congestion, no pneumonia  Started on Protonix and octreotide, octreotide drip has been discontinued in less than 24 hours Status post 1 unit of platelets, Hemoglobin stable Initial INR 1.23>1.51 [facility] consulted , s/p TIPS   Chest pain Recent negative stres test in July, echo WNL Enzymes negative this admission Pain likely esophageal in origin,start Carafate   Fall due to hepatic encephalopathy Apparently the patient fell, ruled out intracranial bleeding . CT head negative Ammonia 202 >179>95, now on lactulose, continue to monitor ammonia  levels   Thrombocytopenia-likely secondary to EtOH abuse Continue to monitor without further transfusions Platelets  finally starting to improve 17>31  Alcohol abuse Previously screen for depression on a recent admission, does not appear to be suicidal or homicidal Continue CIWA protocol, social work consult for placement, 24 /7 supervision. Guardianship    EtOH hepatitis-AST is greater than ALT consistent with alcohol abuse, bilirubin trending up Monitor closely Viral hepatitis studies negative in 2013. HCV and Hep B serologies neg in 11/2014.  Patient not being treated for  acute EtOH hepatitis , started on  prednisolone 40 mg once daily  INR and LFTs every other day   Alcoholic/metabolic encephalopathy/delirium tremors-on CIWA protocol,    alpha-fetoprotein  normal. CT of the brain negative Started scheduled dose of Valium, but discontinued as this could exacerbate his hepatic encephalopathy Continue prn lorazepam  Hypokalemia hypomagnesemia-repleted  Hyperglycemia Due to steroids  Will start SSI    DVT prophylaxsis SCDs   Code Status:  Full code      Family Communication: Discussed in detail with the patient, all imaging results, lab results explained to the patient   Disposition Plan:  PT OT eval, anticipate discharge in 2-3 days      Consultants:  GI  Procedures:  None    Antibiotics: Anti-infectives    None         HPI/Subjective:  Less  Shaky today, more awake, has chest pain  Objective: Vitals:   12/30/15 0500 12/30/15 0541 12/30/15 0545 12/30/15 0844  BP:   (!) 144/78 (!) 163/96  Pulse:   96 (!) 111  Resp: 18   18  Temp: 98.6 F (37 C)  98.6 F (37 C)  TempSrc: Oral   Oral  SpO2: 95%   93%  Weight:  96.8 kg (213 lb 6.4 oz)    Height:        Intake/Output Summary (Last 24 hours) at 12/30/15 0925 Last data filed at 12/30/15 0843  Gross per 24 hour  Intake             1320 ml  Output              551 ml  Net               769 ml  Will  Exam:  Examination:  General exam: Appears calm and comfortable  Respiratory system: Clear to auscultation. Respiratory effort normal. Cardiovascular system: S1 & S2 heard, RRR. No JVD, murmurs, rubs, gallops or clicks. No pedal edema. Gastrointestinal system: Abdomen is nondistended, soft and nontender. No organomegaly or masses felt. Normal bowel sounds heard. Central nervous system: Alert and oriented. No focal neurological deficits. Extremities: Symmetric 5 x 5 power. Skin: No rashes, lesions or ulcers Psychiatry: Judgement and insight appear normal. Mood & affect appropriate.     Data Reviewed: I have personally reviewed following labs and imaging studies  Micro Results No results found for this or any previous visit (from the past 240 hour(s)).  Radiology Reports Ct Head Wo Contrast  Result Date: 12/27/2015 CLINICAL DATA:  50 year old male with history of trauma from a fall. History of seizures. Headache. EXAM: CT HEAD WITHOUT CONTRAST TECHNIQUE: Contiguous axial images were obtained from the base of the skull through the vertex without intravenous contrast. COMPARISON:  Head CT 09/29/2015. FINDINGS: Brain: No evidence of acute infarction, hemorrhage, hydrocephalus, extra-axial collection or mass lesion/mass effect. Vascular: No hyperdense vessel or unexpected calcification. Skull: Normal. Negative for fracture or focal lesion. Sinuses/Orbits: No acute finding. Other: None. IMPRESSION: 1. No acute intracranial abnormalities. 2. Normal appearance of the brain. Electronically Signed   By: Trudie Reed M.D.   On: 12/27/2015 12:39   Dg Chest Port 1 View  Result Date: 12/27/2015 CLINICAL DATA:  Chest and abdominal pain. Right emesis x3. Patient drank heavily yesterday. EXAM: PORTABLE CHEST 1 VIEW COMPARISON:  11/23/2015 FINDINGS: The cardiac silhouette appears enlarged. There is mild pulmonary vascular congestion. No pneumonic consolidation, effusion or  pneumothorax. No suspicious osseous abnormality. IMPRESSION: Cardiomegaly suspected along for portable technique. Vascular congestion. No definite pneumonic consolidation. Electronically Signed   By: Tollie Eth M.D.   On: 12/27/2015 03:00   Dg Abd Portable 2v  Result Date: 12/27/2015 CLINICAL DATA:  Hematemesis, alcohol abuse and cirrhosis. Esophageal varices. EXAM: PORTABLE ABDOMEN - 2 VIEW COMPARISON:  None. FINDINGS: The patient is status post TIPS. Bowel gas pattern is unremarkable. No acute osseous abnormality is seen. Surgical clips are noted in the right upper quadrant. No apparent free air. IMPRESSION: Unremarkable bowel gas pattern.  Status post TIPS. Electronically Signed   By: Tollie Eth M.D.   On: 12/27/2015 03:03     CBC  Recent Labs Lab 12/27/15 0051 12/27/15 0110 12/27/15 0732 12/28/15 0317 12/29/15 0349 12/30/15 0346  WBC 3.4*  --  2.9* 2.2* 2.6* 2.4*  HGB 17.6* 17.3* 15.9 13.8 15.6 16.8  HCT 50.3 51.0 45.7 39.9 44.3 48.3  PLT 24*  --  30* 20* 17* 31*  MCV 98.2  --  100.0 99.0 98.4 101.5*  MCH 34.4*  --  34.8* 34.2* 34.7* 35.3*  MCHC 35.0  --  34.8 34.6 35.2 34.8  RDW 12.6  --  12.8 12.3 12.1 12.4    Chemistries   Recent Labs Lab 12/27/15 0051 12/27/15 0110 12/27/15 0732 12/27/15 1021 12/28/15 0317 12/28/15 0912 12/29/15 0349 12/30/15 0346  NA 141 144 144  --  141  --  138 135  K 3.3* 3.4* 3.7  --  3.1*  --  3.5 4.2  CL 102 102 108  --  103  --  103 104  CO2 27  --  22  --  30  --  26 19*  GLUCOSE 132* 129* 80  --  123*  --  151* 162*  BUN <5* <3* <5*  --  <5*  --  <5* <5*  CREATININE 0.46* 1.00 0.51*  --  0.50*  --  0.48* 0.47*  CALCIUM 8.6*  --  7.4*  --  7.4*  --  8.6* 8.9  MG  --   --   --  1.5*  --  1.5*  --  1.9  AST 290*  --   --  272* 238*  --  205* 161*  ALT 127*  --   --  112* 102*  --  102* 98*  ALKPHOS 88  --   --  72 73  --  84 93  BILITOT 4.4*  --   --  4.2* 4.7*  --  8.3* 7.0*    ------------------------------------------------------------------------------------------------------------------ estimated creatinine clearance is 129.6 mL/min (by C-G formula based on SCr of 0.47 mg/dL (L)). ------------------------------------------------------------------------------------------------------------------ No results for input(s): HGBA1C in the last 72 hours. ------------------------------------------------------------------------------------------------------------------ No results for input(s): CHOL, HDL, LDLCALC, TRIG, CHOLHDL, LDLDIRECT in the last 72 hours. ------------------------------------------------------------------------------------------------------------------ No results for input(s): TSH, T4TOTAL, T3FREE, THYROIDAB in the last 72 hours.  Invalid input(s): FREET3 ------------------------------------------------------------------------------------------------------------------ No results for input(s): VITAMINB12, FOLATE, FERRITIN, TIBC, IRON, RETICCTPCT in the last 72 hours.  Coagulation profile  Recent Labs Lab 12/27/15 0059 12/29/15 1159 12/30/15 0346  INR 1.23 1.42 1.51    No results for input(s): DDIMER in the last 72 hours.  Cardiac Enzymes  Recent Labs Lab 12/27/15 1159 12/27/15 1654 12/27/15 2252  TROPONINI <0.03 <0.03 <0.03   ------------------------------------------------------------------------------------------------------------------ Invalid input(s): POCBNP   CBG:  Recent Labs Lab 12/29/15 0607 12/29/15 1142 12/29/15 1640 12/29/15 2140 12/30/15 0604  GLUCAP 154* 143* 170* 156* 160*       Studies: No results found.    Lab Results  Component Value Date   HGBA1C 6.2 (H) 09/30/2015   HGBA1C 7.2 (H) 11/05/2014   HGBA1C 7.0 (H) 06/11/2013   Lab Results  Component Value Date   LDLCALC 98 09/30/2015   CREATININE 0.47 (L) 12/30/2015       Scheduled Meds: . folic acid  1 mg Oral Daily  . lactulose  20 g  Oral TID  . multivitamin with minerals  1 tablet Oral Daily  . pantoprazole  40 mg Intravenous Q12H  . potassium chloride  40 mEq Oral BID  . prednisoLONE  40 mg Oral Daily  . thiamine  100 mg Oral Daily   Or  . thiamine  100 mg Intravenous Daily   Continuous Infusions:    LOS: 3 days    Time spent: >30 MINS    [facility]  [facility] Pager [phone]. If 7PM-7AM, please contact night-coverage at [email or web address], password [facility] 12/30/2015, 9:25 AM  LOS: 3 days

## 2015-12-30 NOTE — Progress Notes (Signed)
Physical Therapy Treatment Patient Details Name: [name] [name] Casali MRN: [id number] DOB: 1965-03-26 Today's Date: 12/30/2015    History of Present Illness [name] [name] Kerwood is a 50 y.o. male with medical history significant of esophageal varices, cirrhosis, ongoing EtOH abuse; alcoholic cirrhosis.  Patient presents to the ED with c/o 3 episodes of vomiting dark red blood at home per patient and family members who are with him at time of presentation to ED.  There is also some report of passing one stool of BRB. +DTs, +hepatitis, hepatic encephalopathy    PT Comments    Patient reported chest pain, nausea, and increased dizziness with headache after walking 140 ft. Reports he has been having the chest pain for some time. Reports it goes away when resting and increases with exertion (and sometimes eating). He reports when climbing stairs he has chest pain, palpitations, and severe dyspnea.   While walking, he was distracted by talking to MD and demonstrated poor awareness of his limitations/deficits as he had difficulty walking the last 30 ft into his room (3 significant losses of balance, incr dizziness). BP upon sitting was 155/67, HR 90.    Follow Up Recommendations  Supervision/Assistance - 24 hour;Home health PT (could benefit from post-acute PT, however he reports he does not go to his primary doctor appointments due to lack of money). Per interpreter's discussion via phone with patient's daughter, patient is to go to his brother's second floor apartment (15 steps to enter) upon discharge.     Equipment Recommendations  Rolling walker with 5" wheels    Recommendations for Other Services       Precautions / Restrictions Precautions Precautions: Fall Restrictions Weight Bearing Restrictions: No    Mobility  Bed Mobility Overal bed mobility: Needs Assistance Bed Mobility: Supine to Sit     Supine to sit: Min assist     General bed mobility comments: pt very close to EOB and leaning  very far with no awareness of impending fall  Transfers Overall transfer level: Needs assistance Equipment used: Rolling walker (2 wheeled);None Transfers: Sit to/from Stand Sit to Stand: Min assist         General transfer comment: steadying assist; vc for safe use of RW  Ambulation/Gait Ambulation/Gait assistance: Min assist;Mod assist Ambulation Distance (Feet): 140 Feet Assistive device: 1 person hand held assist Gait Pattern/deviations: Step-through pattern;Decreased stride length;Shuffle;Staggering right;Wide base of support   Gait velocity interpretation: Below normal speed for age/gender General Gait Details: MD arrived while walking and began using interpreter to talk with pt, therefore less aware of his fatigue level and with incr difficulty over last 30 ft returning to his room (stagger right with mod assist 3 times); reported incr dizziness   Stairs            Wheelchair Mobility    Modified Rankin (Stroke Patients Only)       Balance Overall balance assessment: Needs assistance Sitting-balance support: No upper extremity supported;Feet supported Sitting balance-Leahy Scale: Fair     Standing balance support: No upper extremity supported Standing balance-Leahy Scale: Poor Standing balance comment: dependent on UE support as he fatigues                    Cognition Arousal/Alertness: Awake/alert Behavior During Therapy: WFL for tasks assessed/performed Overall Cognitive Status: No family/caregiver present to determine baseline cognitive functioning Area of Impairment: Memory;Following commands;Safety/judgement;Problem solving Orientation Level: Time   Memory: Decreased short-term memory Following Commands: Follows one step commands with increased time Safety/Judgement: Decreased  awareness of safety;Decreased awareness of deficits   Problem Solving: Slow processing;Requires verbal cues;Requires tactile cues General Comments: via  interpreter, pt with inconsistent answers re: chest pain (what makes it worse, better, etc)    Exercises      General Comments General comments (skin integrity, edema, etc.): Graciella, Spanish interpreter present      Pertinent Vitals/Pain Pain Assessment: 0-10 Pain Score: 8  Pain Location: headache Pain Descriptors / Indicators: Aching Pain Intervention(s): Patient requesting pain meds-RN notified    Home Living                      Prior Function            PT Goals (current goals can now be found in the care plan section) Acute Rehab PT Goals Patient Stated Goal: did not state Time For Goal Achievement: 01/11/16 Progress towards PT goals: Progressing toward goals    Frequency    Min 3X/week      PT Plan Current plan remains appropriate    Co-evaluation             End of Session Equipment Utilized During Treatment: Gait belt Activity Tolerance: Patient limited by fatigue Patient left: in chair;with call bell/phone within reach;with chair alarm set     Time: 1610-9604 PT Time Calculation (min) (ACUTE ONLY): 49 min  Charges:  $Gait Training: 23-37 mins $Therapeutic Activity: 8-22 mins                    G Codes:      Anasha Perfecto January 24, 2016, 12:24 PM 2016-01-24 Pager [phone]

## 2015-12-30 NOTE — Progress Notes (Signed)
Informed by physical therapist that patient is complaining of Headache and  "chest pain" after he walked with PT. No PRN pain medications to be given.  Blood sugar 233. Paged Dr. Susie CassetteAbrol about complaints and blood sugar. Informed that pain medication will be ordered as well as echocardiogram for patiens. Patient stable at this time BP 155/88, pulse 87. Will continue to monitor.   Wave Calzada, RN

## 2015-12-30 NOTE — Progress Notes (Signed)
Interpreter Erling CruzGraciela Namihira Lynn for PT

## 2015-12-30 NOTE — Progress Notes (Signed)
Pt. Reported severe headache 9/10. No PRN to give. Paged MD. Verbal order given Hydromorphone 1mg  every 6 hours PRN. Will continue to monitor.   Ninfa Giannelli, RN

## 2015-12-30 NOTE — Progress Notes (Signed)
CRITICAL VALUE ALERT  Critical value received: troponin 0.03  Date of notification:  12/30/15   Time of notification:  2015  Critical value read back:Yes.    Nurse who received alert:  Jeanella FlatteryJequetta T Melford Tullier   MD notified (1st page):  Craige CottaKirby (night shift on-call)  Time of first page:  2017  MD notified (2nd page):  Time of second page:  Responding MD:    Time MD responded:

## 2015-12-31 ENCOUNTER — Inpatient Hospital Stay (HOSPITAL_COMMUNITY): Payer: Self-pay

## 2015-12-31 ENCOUNTER — Encounter (HOSPITAL_COMMUNITY): Payer: Self-pay | Admitting: Nurse Practitioner

## 2015-12-31 DIAGNOSIS — R079 Chest pain, unspecified: Secondary | ICD-10-CM

## 2015-12-31 DIAGNOSIS — K920 Hematemesis: Principal | ICD-10-CM

## 2015-12-31 DIAGNOSIS — F1010 Alcohol abuse, uncomplicated: Secondary | ICD-10-CM

## 2015-12-31 LAB — COMPREHENSIVE METABOLIC PANEL
ALBUMIN: 3.3 g/dL — AB (ref 3.5–5.0)
ALT: 83 U/L — AB (ref 17–63)
AST: 106 U/L — AB (ref 15–41)
Alkaline Phosphatase: 87 U/L (ref 38–126)
Anion gap: 8 (ref 5–15)
BUN: 6 mg/dL (ref 6–20)
CHLORIDE: 106 mmol/L (ref 101–111)
CO2: 23 mmol/L (ref 22–32)
CREATININE: 0.49 mg/dL — AB (ref 0.61–1.24)
Calcium: 9.4 mg/dL (ref 8.9–10.3)
GFR calc non Af Amer: >60 mL/min (ref >60)
Glucose, Bld: 167 mg/dL — ABNORMAL HIGH (ref 65–99)
Potassium: 3.9 mmol/L (ref 3.5–5.1)
SODIUM: 137 mmol/L (ref 135–145)
Total Bilirubin: 4.5 mg/dL — ABNORMAL HIGH (ref 0.3–1.2)
Total Protein: 6.8 g/dL (ref 6.5–8.1)

## 2015-12-31 LAB — GLUCOSE, CAPILLARY
GLUCOSE-CAPILLARY: 119 mg/dL — AB (ref 65–99)
GLUCOSE-CAPILLARY: 173 mg/dL — AB (ref 65–99)
GLUCOSE-CAPILLARY: 219 mg/dL — AB (ref 65–99)

## 2015-12-31 LAB — PROTIME-INR
INR: 1.55
Prothrombin Time: 18.7 seconds — ABNORMAL HIGH (ref 11.4–15.2)

## 2015-12-31 LAB — CBC
HCT: 46.7 % (ref 39.0–52.0)
Hemoglobin: 16.3 g/dL (ref 13.0–17.0)
MCH: 34.9 pg — AB (ref 26.0–34.0)
MCHC: 34.9 g/dL (ref 30.0–36.0)
MCV: 100 fL (ref 78.0–100.0)
PLATELETS: 42 10*3/uL — AB (ref 150–400)
RBC: 4.67 MIL/uL (ref 4.22–5.81)
RDW: 12.6 % (ref 11.5–15.5)
WBC: 5.8 10*3/uL (ref 4.0–10.5)

## 2015-12-31 LAB — AMMONIA: AMMONIA: 110 umol/L — AB (ref 9–35)

## 2015-12-31 LAB — TROPONIN I: Troponin I: <0.03 ng/mL (ref <0.03)

## 2015-12-31 LAB — HEMOGLOBIN A1C
Hgb A1c MFr Bld: 6 % — ABNORMAL HIGH (ref 4.8–5.6)
Mean Plasma Glucose: 126 mg/dL

## 2015-12-31 LAB — MAGNESIUM
MAGNESIUM: 1.9 mg/dL (ref 1.7–2.4)
Magnesium: 1.8 mg/dL (ref 1.7–2.4)

## 2015-12-31 MED ORDER — LACTULOSE 10 GM/15ML PO SOLN: 20.0000 g | Freq: Two times a day (BID) | ORAL | 1 refills | Status: DC

## 2015-12-31 MED ORDER — FOLIC ACID 1 MG PO TABS: 1.0000 mg | ORAL_TABLET | Freq: Every day | ORAL | 1 refills | Status: DC

## 2015-12-31 MED ORDER — MAGNESIUM OXIDE -MG SUPPLEMENT 400 (240 MG) MG PO TABS: 1.0000 | ORAL_TABLET | Freq: Two times a day (BID) | ORAL | 1 refills | Status: AC

## 2015-12-31 MED ORDER — THIAMINE HCL 100 MG PO TABS: 100.0000 mg | ORAL_TABLET | Freq: Every day | ORAL | 1 refills | Status: DC

## 2015-12-31 MED ORDER — PANTOPRAZOLE SODIUM 40 MG PO TBEC: 40.0000 mg | DELAYED_RELEASE_TABLET | Freq: Every day | ORAL | 1 refills | Status: DC

## 2015-12-31 MED ORDER — FAMOTIDINE 20 MG PO TABS
40.0000 mg | ORAL_TABLET | Freq: Every day | ORAL | Status: DC
  Administered 2015-12-31: 40 mg via ORAL
  Filled 2015-12-31: qty 2

## 2015-12-31 MED ORDER — TECHNETIUM TC 99M DIETHYLENETRIAME-PENTAACETIC ACID: 32.0000 | Freq: Once | INTRAVENOUS | Status: DC | PRN

## 2015-12-31 MED ORDER — LACTULOSE 10 GM/15ML PO SOLN
30.0000 g | Freq: Three times a day (TID) | ORAL | Status: DC
  Administered 2015-12-31: 30 g via ORAL
  Filled 2015-12-31: qty 45

## 2015-12-31 MED ORDER — LACTULOSE 10 GM/15ML PO SOLN: 30.0000 g | Freq: Three times a day (TID) | ORAL | 1 refills | Status: DC

## 2015-12-31 MED ORDER — CHLORDIAZEPOXIDE HCL 25 MG PO CAPS: 25.0000 mg | ORAL_CAPSULE | Freq: Three times a day (TID) | ORAL | 0 refills | Status: AC

## 2015-12-31 MED ORDER — TECHNETIUM TO 99M ALBUMIN AGGREGATED
4.0000 | Freq: Once | INTRAVENOUS | Status: AC | PRN
  Administered 2015-12-31: 4 via INTRAVENOUS

## 2015-12-31 MED ORDER — MAGNESIUM SULFATE 2 GM/50ML IV SOLN
2.0000 g | Freq: Once | INTRAVENOUS | Status: AC
  Administered 2015-12-31: 2 g via INTRAVENOUS
  Filled 2015-12-31: qty 50

## 2015-12-31 MED ORDER — CHLORDIAZEPOXIDE HCL 25 MG PO CAPS
25.0000 mg | ORAL_CAPSULE | Freq: Three times a day (TID) | ORAL | 0 refills | Status: DC
Start: 2015-12-31 — End: 2015-12-31

## 2015-12-31 NOTE — Clinical Social Work Note (Addendum)
CSW aware of updated PT recommendations. CSW has left message with assistant director of social work to discuss possible LOG due to lack of insurance. Awaiting call back.  Sarah Boswell, CSW [phone]  1:20 pm Unable to extend an LOG. Will notify patient when he returns to his room and offer home health services. CSW will notify RNCM with answer.  Sarah Boswell, CSW [phone]  2:12 pm Graciela, interpreter, will be available at 3:00 pm to discuss inability to go to SNF with patient. RN and RNCM aware.  Sarah Boswell, CSW [phone]  3:13 pm CSW, RNCM, and RN met with interpreter to speak with patient about discharge planning. CSW explained insurance barrier with SNF. Patient expressed understanding. He said he used to have Medicare but does not have it anymore so the RNCM thinks he may mean Medicaid. Patient unsure. RNCM offered home health but patient is unsure of whether he will be discharging to his brother or sister's house. He also does not have a phone.  CSW signing off as social work intervention is no longer needed. Patient is discharging today.  Sarah Boswell, CSW [phone]  

## 2015-12-31 NOTE — Progress Notes (Signed)
Patient given discharge instructions with interpreter present in room.  All questions answered.

## 2015-12-31 NOTE — Progress Notes (Signed)
Patient complaining of chest pain after walking with PT.  Dr. Susie CassetteAbrol made aware and new orders received. Will continue to monitor.

## 2015-12-31 NOTE — Discharge Summary (Addendum)
Physician Discharge Summary  [name] [name] MRN: [id number] DOB/AGE: 1966-03-01 50 y.o.  PCP: Angelica Chessman, MD   Admit date: 12/27/2015 Discharge date: 12/31/2015  Discharge Diagnoses:    Principal Problem:   Hematemesis Active Problems:   Alcohol abuse   Alcoholic cirrhosis (HCC)   Thrombocytopenia, secondary   Esophageal varices in alcoholic cirrhosis ([place])   Fall   Encephalopathy, hepatic (HCC)   Alcoholic hepatitis without ascites    Follow-up recommendations Follow-up with PCP in 3-5 days , including all  additional recommended appointments as below Follow-up CBC, CMP, magnesium, ammonia, INR, in 3-5 days. This needs to be monitored on an intermittent basis If patient continues to drink alcohol or recommend palliative care/hospice care at home. Suspect patient will continue to be noncompliant. Extremely high risk for readmission given noncompliance. Patient has been admitted in multiple Hospitals Prior to This Admission Patient needs to follow-up in GI in  [facility] Patient not a candidate for cardiac cath given extremely low platelets        Medication List    STOP taking these medications   FLUoxetine 20 MG capsule Commonly known as:  PROZAC     TAKE these medications   carvedilol 3.125 MG tablet Commonly known as:  COREG Take 1 tablet (3.125 mg total) by mouth 2 (two) times daily with a meal.   chlordiazePOXIDE 25 MG capsule Commonly known as:  LIBRIUM Take 1 capsule (25 mg total) by mouth 3 (three) times daily.   folic acid 1 MG tablet Commonly known as:  FOLVITE Take 1 tablet (1 mg total) by mouth daily. Start taking on:  01/01/2016   lactulose 10 GM/15ML solution Commonly known as:  CHRONULAC Take 45 mLs (30 g total) by mouth 3 (three) times daily. What changed:  how much to take  when to take this   Magnesium Oxide 400 (240 Mg) MG Tabs Take 1 tablet by mouth 2 (two) times daily.   pantoprazole 40 MG tablet Commonly known as:   PROTONIX Take 1 tablet (40 mg total) by mouth daily.   thiamine 100 MG tablet Take 1 tablet (100 mg total) by mouth daily. Start taking on:  01/01/2016         Discharge Condition: Overall prognosis guarded secondary to chronic alcohol use   Discharge Instructions Get Medicines reviewed and adjusted: Please take all your medications with you for your next visit with your Primary MD  Please request your Primary MD to go over all hospital tests and procedure/radiological results at the follow up, please ask your Primary MD to get all Hospital records sent to his/her office.  If you experience worsening of your admission symptoms, develop shortness of breath, life threatening emergency, suicidal or homicidal thoughts you must seek medical attention immediately by calling 911 or calling your MD immediately if symptoms less severe.  You must read complete instructions/literature along with all the possible adverse reactions/side effects for all the Medicines you take and that have been prescribed to you. Take any new Medicines after you have completely understood and accpet all the possible adverse reactions/side effects.   Do not drive when taking Pain medications.   Do not take more than prescribed Pain, Sleep and Anxiety Medications  Special Instructions: If you have smoked or chewed Tobacco in the last 2 yrs please stop smoking, stop any regular Alcohol and or any Recreational drug use.  Wear Seat belts while driving.  Please note  You were cared for by a hospitalist during your hospital  stay. Once you are discharged, your primary care physician will handle any further medical issues. Please note that NO REFILLS for any discharge medications will be authorized once you are discharged, as it is imperative that you return to your primary care physician (or establish a relationship with a primary care physician if you do not have one) for your aftercare needs so that they can  reassess your need for medications and monitor your lab values.      Consults:  Gastroenterology     Significant Diagnostic Studies:  Ct Head Wo Contrast  Result Date: 12/27/2015 CLINICAL DATA:  50 year old male with history of trauma from a fall. History of seizures. Headache. EXAM: CT HEAD WITHOUT CONTRAST TECHNIQUE: Contiguous axial images were obtained from the base of the skull through the vertex without intravenous contrast. COMPARISON:  Head CT 09/29/2015. FINDINGS: Brain: No evidence of acute infarction, hemorrhage, hydrocephalus, extra-axial collection or mass lesion/mass effect. Vascular: No hyperdense vessel or unexpected calcification. Skull: Normal. Negative for fracture or focal lesion. Sinuses/Orbits: No acute finding. Other: None. IMPRESSION: 1. No acute intracranial abnormalities. 2. Normal appearance of the brain. Electronically Signed   By: Vinnie Langton M.D.   On: 12/27/2015 12:39   Dg Chest Port 1 View  Result Date: 12/27/2015 CLINICAL DATA:  Chest and abdominal pain. Right emesis x3. Patient drank heavily yesterday. EXAM: PORTABLE CHEST 1 VIEW COMPARISON:  11/23/2015 FINDINGS: The cardiac silhouette appears enlarged. There is mild pulmonary vascular congestion. No pneumonic consolidation, effusion or pneumothorax. No suspicious osseous abnormality. IMPRESSION: Cardiomegaly suspected along for portable technique. Vascular congestion. No definite pneumonic consolidation. Electronically Signed   By: Ashley Royalty M.D.   On: 12/27/2015 03:00   Dg Abd Portable 2v  Result Date: 12/27/2015 CLINICAL DATA:  Hematemesis, alcohol abuse and cirrhosis. Esophageal varices. EXAM: PORTABLE ABDOMEN - 2 VIEW COMPARISON:  None. FINDINGS: The patient is status post TIPS. Bowel gas pattern is unremarkable. No acute osseous abnormality is seen. Surgical clips are noted in the right upper quadrant. No apparent free air. IMPRESSION: Unremarkable bowel gas pattern.  Status post TIPS.  Electronically Signed   By: Ashley Royalty M.D.   On: 12/27/2015 03:03         Filed Weights   12/29/15 0446 12/30/15 0541 12/31/15 0404  Weight: 98.2 kg (216 lb 9.6 oz) 96.8 kg (213 lb 6.4 oz) 97.8 kg (215 lb 8 oz)     Microbiology: No results found for this or any previous visit (from the past 240 hour(s)).     Blood Culture    Component Value Date/Time   SDES BLOOD RIGHT HAND 06/16/2013 1041   SPECREQUEST BOTTLES DRAWN AEROBIC ONLY 6CC 06/16/2013 1041   CULT  06/16/2013 1041    NO GROWTH 5 DAYS Performed at [facility] 06/22/2013 FINAL 06/16/2013 1041      Labs: Results for orders placed or performed during the hospital encounter of 12/27/15 (from the past 48 hour(s))  Glucose, capillary     Status: Abnormal   Collection Time: 12/29/15 11:42 AM  Result Value Ref Range   Glucose-Capillary 143 (H) 65 - 99 mg/dL  Protime-INR     Status: Abnormal   Collection Time: 12/29/15 11:59 AM  Result Value Ref Range   Prothrombin Time 17.5 (H) 11.4 - 15.2 seconds   INR 1.42   Glucose, capillary     Status: Abnormal   Collection Time: 12/29/15  4:40 PM  Result Value Ref Range   Glucose-Capillary 170 (H)  65 - 99 mg/dL   Comment 1 Notify RN   Glucose, capillary     Status: Abnormal   Collection Time: 12/29/15  9:40 PM  Result Value Ref Range   Glucose-Capillary 156 (H) 65 - 99 mg/dL   Comment 1 Notify RN    Comment 2 Document in Chart   Ammonia     Status: Abnormal   Collection Time: 12/30/15  3:46 AM  Result Value Ref Range   Ammonia 95 (H) 9 - 35 umol/L  Comprehensive metabolic panel     Status: Abnormal   Collection Time: 12/30/15  3:46 AM  Result Value Ref Range   Sodium 135 135 - 145 mmol/L   Potassium 4.2 3.5 - 5.1 mmol/L   Chloride 104 101 - 111 mmol/L   CO2 19 (L) 22 - 32 mmol/L   Glucose, Bld 162 (H) 65 - 99 mg/dL   BUN <5 (L) 6 - 20 mg/dL   Creatinine, Ser 0.47 (L) 0.61 - 1.24 mg/dL   Calcium 8.9 8.9 - 10.3 mg/dL   Total Protein 7.5  6.5 - 8.1 g/dL   Albumin 3.6 3.5 - 5.0 g/dL   AST 161 (H) 15 - 41 U/L   ALT 98 (H) 17 - 63 U/L   Alkaline Phosphatase 93 38 - 126 U/L   Total Bilirubin 7.0 (H) 0.3 - 1.2 mg/dL   GFR calc non Af Amer >60 >60 mL/min   GFR calc Af Amer >60 >60 mL/min    Comment: (NOTE) The eGFR has been calculated using the CKD EPI equation. This calculation has not been validated in all clinical situations. eGFR's persistently <60 mL/min signify possible Chronic Kidney Disease.    Anion gap 12 5 - 15  Protime-INR     Status: Abnormal   Collection Time: 12/30/15  3:46 AM  Result Value Ref Range   Prothrombin Time 18.4 (H) 11.4 - 15.2 seconds   INR 1.51   CBC     Status: Abnormal   Collection Time: 12/30/15  3:46 AM  Result Value Ref Range   WBC 2.4 (L) 4.0 - 10.5 K/uL   RBC 4.76 4.22 - 5.81 MIL/uL   Hemoglobin 16.8 13.0 - 17.0 g/dL   HCT 48.3 39.0 - 52.0 %   MCV 101.5 (H) 78.0 - 100.0 fL   MCH 35.3 (H) 26.0 - 34.0 pg   MCHC 34.8 30.0 - 36.0 g/dL   RDW 12.4 11.5 - 15.5 %   Platelets 31 (L) 150 - 400 K/uL    Comment: REPEATED TO VERIFY CONSISTENT WITH PREVIOUS RESULT   Magnesium     Status: None   Collection Time: 12/30/15  3:46 AM  Result Value Ref Range   Magnesium 1.9 1.7 - 2.4 mg/dL  Glucose, capillary     Status: Abnormal   Collection Time: 12/30/15  6:04 AM  Result Value Ref Range   Glucose-Capillary 160 (H) 65 - 99 mg/dL  Glucose, capillary     Status: Abnormal   Collection Time: 12/30/15 11:45 AM  Result Value Ref Range   Glucose-Capillary 233 (H) 65 - 99 mg/dL  Hemoglobin A1c     Status: Abnormal   Collection Time: 12/30/15  1:09 PM  Result Value Ref Range   Hgb A1c MFr Bld 6.0 (H) 4.8 - 5.6 %    Comment: (NOTE)         Pre-diabetes: 5.7 - 6.4         Diabetes: >6.4  Glycemic control for adults with diabetes: <7.0    Mean Plasma Glucose 126 mg/dL    Comment: (NOTE) Performed At: [facility] [place], [place] [id number] Lindon Romp MD  [id number]   Troponin I     Status: None   Collection Time: 12/30/15  1:09 PM  Result Value Ref Range   Troponin I <0.03 <0.03 ng/mL  Glucose, capillary     Status: Abnormal   Collection Time: 12/30/15  4:31 PM  Result Value Ref Range   Glucose-Capillary 172 (H) 65 - 99 mg/dL  Troponin I     Status: Abnormal   Collection Time: 12/30/15  6:51 PM  Result Value Ref Range   Troponin I 0.03 (HH) <0.03 ng/mL    Comment: CRITICAL RESULT CALLED TO, READ BACK BY AND VERIFIED WITH: J THOMAS,RN 2014 12/30/15 D BRADLEY   Glucose, capillary     Status: Abnormal   Collection Time: 12/30/15  9:12 PM  Result Value Ref Range   Glucose-Capillary 186 (H) 65 - 99 mg/dL   Comment 1 Notify RN    Comment 2 Document in Chart   Troponin I     Status: None   Collection Time: 12/31/15 12:08 AM  Result Value Ref Range   Troponin I <0.03 <0.03 ng/mL  Ammonia     Status: Abnormal   Collection Time: 12/31/15 12:08 AM  Result Value Ref Range   Ammonia 110 (H) 9 - 35 umol/L  Comprehensive metabolic panel     Status: Abnormal   Collection Time: 12/31/15 12:08 AM  Result Value Ref Range   Sodium 137 135 - 145 mmol/L   Potassium 3.9 3.5 - 5.1 mmol/L   Chloride 106 101 - 111 mmol/L   CO2 23 22 - 32 mmol/L   Glucose, Bld 167 (H) 65 - 99 mg/dL   BUN 6 6 - 20 mg/dL   Creatinine, Ser 0.49 (L) 0.61 - 1.24 mg/dL   Calcium 9.4 8.9 - 10.3 mg/dL   Total Protein 6.8 6.5 - 8.1 g/dL   Albumin 3.3 (L) 3.5 - 5.0 g/dL   AST 106 (H) 15 - 41 U/L   ALT 83 (H) 17 - 63 U/L   Alkaline Phosphatase 87 38 - 126 U/L   Total Bilirubin 4.5 (H) 0.3 - 1.2 mg/dL   GFR calc non Af Amer >60 >60 mL/min   GFR calc Af Amer >60 >60 mL/min    Comment: (NOTE) The eGFR has been calculated using the CKD EPI equation. This calculation has not been validated in all clinical situations. eGFR's persistently <60 mL/min signify possible Chronic Kidney Disease.    Anion gap 8 5 - 15  CBC     Status: Abnormal   Collection Time: 12/31/15  12:08 AM  Result Value Ref Range   WBC 5.8 4.0 - 10.5 K/uL   RBC 4.67 4.22 - 5.81 MIL/uL   Hemoglobin 16.3 13.0 - 17.0 g/dL   HCT 46.7 39.0 - 52.0 %   MCV 100.0 78.0 - 100.0 fL   MCH 34.9 (H) 26.0 - 34.0 pg   MCHC 34.9 30.0 - 36.0 g/dL   RDW 12.6 11.5 - 15.5 %   Platelets 42 (L) 150 - 400 K/uL    Comment: CONSISTENT WITH PREVIOUS RESULT  Magnesium     Status: None   Collection Time: 12/31/15 12:08 AM  Result Value Ref Range   Magnesium 1.8 1.7 - 2.4 mg/dL  Protime-INR     Status: Abnormal  Collection Time: 12/31/15 12:08 AM  Result Value Ref Range   Prothrombin Time 18.7 (H) 11.4 - 15.2 seconds   INR 1.55   Glucose, capillary     Status: Abnormal   Collection Time: 12/31/15  6:32 AM  Result Value Ref Range   Glucose-Capillary 119 (H) 65 - 99 mg/dL   Comment 1 Notify RN    Comment 2 Document in Chart      Lipid Panel     Component Value Date/Time   CHOL 189 09/30/2015 2029   TRIG 123 09/30/2015 2029   HDL 66 09/30/2015 2029   CHOLHDL 2.9 09/30/2015 2029   VLDL 25 09/30/2015 2029   [place] 98 09/30/2015 2029     Lab Results  Component Value Date   HGBA1C 6.0 (H) 12/30/2015   HGBA1C 6.2 (H) 09/30/2015   HGBA1C 7.2 (H) 11/05/2014        HPI :   [name] [name] a 50 y.o.malewith medical history significant of esophageal varices, cirrhosis, ongoing EtOH abuse. Patient presents to the ED with c/o 3 episodes of vomiting dark red blood at home per patient and family members who are with him at time of presentation to ED. There is also some report of passing one stool of BRB. Has been drinking heavily today. Denies melena. Had syncopal episode today at home.  ED Course:HGB 17.6, guiac negative, no vomiting thus far in ED. Given zofran for nausea, octreotide and PPI gtt are started, platelets 24 so he is transfused platelets as well.EtOH level 475.Esophageal banding and severe esophagitis on EGD 2013 in [facility].  TIPS 2013 in [place]:  Hematemesis vs Epistaxis vs hemoptysis - resolved Initially there was concern for variceal bleeding , patient denied any hematemesis, or melena. Suspected upper airway could be the source of mild epistaxis manifesting as hematemesis Chest x-ray shows cardiomegaly with vascular congestion, no pneumonia  Initially placed on Protonix and octreotide, octreotide drip   discontinued in less than 24 hours Status post 1 unit of platelets, Hemoglobin stable Initial INR 1.23>1.51>1.55   [facility] consulted , s/p TIPS in 2013  Chest pain Recent negative stress test in July, echo WNL Enzymes negative this admission  Pain likely esophageal in origin,start Carafate  Not a Candidate for cardiac cath because of low platelets  Fall due to hepatic encephalopathy Apparently the patient fell, ruled out intracranial bleeding . CT head negative Ammonia 202 >179>95>110, now on lactulose, continue to monitor ammonia levels   Thrombocytopenia-likely secondary to EtOH abuse /bone marrow suppression Continue to monitor without further transfusions Platelets  finally starting to improve 17>31>42  Alcohol abuse Previously screen for depression on a recent admission, does not appear to be suicidal or homicidal Continue CIWA protocol, social work consult for placement, 24 /7 supervision. Guardianship, patient's wife plans to visit him and take care of him. Home health is being arranged I suspect that the patient will continue to drink heavily given trend in the last 1 year  will continue Librium for 2 more days  EtOH hepatitis-AST is greater than ALT consistent with alcohol abuse, bilirubin peaked at 8.3 Patient needs close monitoring of his INR, platelets, liver function Viral hepatitis studies negative in 2013. HCV and Hep B serologies neg in 11/2014.  Because of diagnosis of acute EtOH hepatitis , started on  prednisolone 40 mg once daily by GI,  Discontinued prior to  patient's discharge by GI INR and LFTs every other day  Patient needs close outpatient follow-up  with his GI doctor in [place] encephalopathy/delirium tremors-on CIWA protocol,    alpha-fetoprotein  normal. CT of the brain negative Started scheduled dose of Valium, but discontinued as this could exacerbate his hepatic encephalopathy Continue prn Librium for 2 more days   Hypokalemia hypomagnesemia-repleted  Hyperglycemia Due to steroids  Started on sliding scale insulin, CBGs improved, hemoglobin A1c 6.0    Discharge Exam:   Blood pressure (!) 146/84, pulse 90, temperature 98.6 F (37 C), temperature source Oral, resp. rate 18, height _0  (1.803 m), weight 97.8 kg (215 lb 8 oz), SpO2 94 %.   General exam: Appears calm and comfortable  Respiratory system: Clear to auscultation. Respiratory effort normal. Cardiovascular system: S1 & S2 heard, RRR. No JVD, murmurs, rubs, gallops or clicks. No pedal edema. Gastrointestinal system: Abdomen is nondistended, soft and nontender. No organomegaly or masses felt. Normal bowel sounds heard. Central nervous system: Alert and oriented. No focal neurological deficits. Extremities: Symmetric 5 x 5 power. Skin: No rashes, lesions or ulcers Psychiatry: Judgement and insight appear normal. Mood & affect appropriate.    Follow-up Information    JEGEDE, OLUGBEMIGA, MD. Schedule an appointment as soon as possible for a visit in 2 day(s).   Specialty:  Internal Medicine Why:  Hospital follow-up Contact information: [address] [place] [address] [phone]           Signed: Reyne Dumas 12/31/2015, 9:36 AM        Time spent >45 mins

## 2015-12-31 NOTE — Progress Notes (Signed)
Physical Therapy Treatment Patient Details Name: [name] [name] MRN: [id number] DOB: 1965/09/15 Today's Date: 12/31/2015    History of Present Illness Nilton Lave is a 50 y.o. male with medical history significant of esophageal varices, cirrhosis, ongoing EtOH abuse; alcoholic cirrhosis.  Patient presents to the ED with c/o 3 episodes of vomiting dark red blood at home per patient and family members who are with him at time of presentation to ED.  There is also some report of passing one stool of BRB. +DTs, +hepatitis, hepatic encephalopathy    PT Comments    Patient's balance more impaired compared to 10/24; required cues for safe use of walker and to stand upright; as he fatigued, gait deteriorated to needing 2 person assist and ultimately brought a chair for pt to sit in to prevent fall (legs giving out and increasing tremors). As it became clear patient unable to continue walking (after 40 feet) he continued to state he could walk the remaining 25 ft to his chair demonstrating poor safety awareness.   Updated RN and Case Manager re: change in discharge recommendation (decline in function and does not have 24 hour assist)   Follow Up Recommendations  Supervision/Assistance - 24 hour;SNF     Equipment Recommendations  Rolling walker with 5" wheels    Recommendations for Other Services       Precautions / Restrictions Precautions Precautions: Fall Restrictions Weight Bearing Restrictions: No    Mobility  Bed Mobility                  Transfers Overall transfer level: Needs assistance Equipment used: Rolling walker (2 wheeled);None   Sit to Stand: Min assist;+2 physical assistance;+2 safety/equipment         General transfer comment: tremors worse today and impacting balance/coordination as coming to standing; poor ability to control return to sitting  Ambulation/Gait Ambulation/Gait assistance: Min assist;Mod assist;+2 physical assistance;+2  safety/equipment Ambulation Distance (Feet): 40 Feet Assistive device: Rolling walker (2 wheeled) Gait Pattern/deviations: Step-through pattern;Decreased stride length;Shuffle;Trunk flexed Gait velocity: decr Gait velocity interpretation: Below normal speed for age/gender General Gait Details: Patient's balance more impaired compared to 10/24; required cues for safe use of walker and to stand upright; as he fatigued, gait deteriorated to needing 2 person assist and ultimately bringing a chair for pt to sit in to prevent fall (legs giving out and increasing tremors)   Stairs Stairs:  (not safe to attempt)          Wheelchair Mobility    Modified Rankin (Stroke Patients Only)       Balance     Sitting balance-Leahy Scale: Fair     Standing balance support: Bilateral upper extremity supported Standing balance-Leahy Scale: Poor Standing balance comment: Dependent on UE support; vc for extending hips and knees                    Cognition Arousal/Alertness: Awake/alert Behavior During Therapy: Flat affect Overall Cognitive Status: No family/caregiver present to determine baseline cognitive functioning Area of Impairment: Memory;Following commands;Safety/judgement;Problem solving;Attention;Awareness   Current Attention Level: Sustained (difficulty attending to therapist and interpreter via phone)   Following Commands: Follows one step commands with increased time Safety/Judgement: Decreased awareness of safety;Decreased awareness of deficits (poor judgement re: his safe ability to walk) Awareness:  (pre-intellectual) Problem Solving: Slow processing;Requires verbal cues;Requires tactile cues;Difficulty sequencing General Comments: Patient requires vc for sequencing coming to stand and return to sitting.; as clearly unable to continue walking (tremors and legs giving away)  he continued to state he could walk the remaining 25 ft to his chair    Exercises       General Comments General comments (skin integrity, edema, etc.): Phone interpreter [facility](Luis) utilized. RN in to assess pt at end of session due to reported chest pain      Pertinent Vitals/Pain Pain Assessment: 0-10 Pain Score: 9  Pain Location: chest and head Pain Descriptors / Indicators: Aching;Stabbing Pain Intervention(s): Limited activity within patient's tolerance;Monitored during session;Other (comment) (RN in to assess)    Home Living                      Prior Function            PT Goals (current goals can now be found in the care plan section) Acute Rehab PT Goals Patient Stated Goal: did not state Time For Goal Achievement: 01/11/16 Progress towards PT goals: Not progressing toward goals - comment (decline in status compared to 10/24)    Frequency    Min 3X/week      PT Plan Discharge plan needs to be updated    Co-evaluation PT/OT/SLP Co-Evaluation/Treatment: Yes Reason for Co-Treatment: For patient/therapist safety         End of Session Equipment Utilized During Treatment: Gait belt Activity Tolerance: Patient limited by fatigue;Treatment limited secondary to medical complications (Comment) (tremors, chest pain, leg weakness) Patient left: in chair;with call bell/phone within reach;with chair alarm set;with nursing/sitter in room     Time: 1110-1130 PT Time Calculation (min) (ACUTE ONLY): 20 min  Charges:  $Gait Training: 8-22 mins                    G Codes:      Marzetta Lanza 12/31/2015, 12:14 PM  Pager [phone]

## 2015-12-31 NOTE — Clinical Social Work Note (Signed)
CSW acknowledges consult, "Does HPOA want SNF, will it be covered." PT is recommending HHPT and patient is walking 140 feet with min-mod assist. With this information as well as the patient's age and lack of insurance, he would not be able to go to a SNF.   Will discuss possibility of assisting patient's wife with a visa with Chiropodistassistant director of social work.  Charlynn CourtSarah Blaine Hari, CSW [phone]

## 2015-12-31 NOTE — Progress Notes (Signed)
Occupational Therapy Treatment Patient Details Name: [name] [name] MRN: [id number] DOB: Dec 17, 1965 Today's Date: 12/31/2015    History of present illness [name] [name] is a 50 y.o. male with medical history significant of esophageal varices, cirrhosis, ongoing EtOH abuse; alcoholic cirrhosis.  Patient presents to the ED with c/o 3 episodes of vomiting dark red blood at home per patient and family members who are with him at time of presentation to ED.  There is also some report of passing one stool of BRB. +DTs, +hepatitis, hepatic encephalopathy   OT comments  Pt with weakness, headache and chest pain and poor activity tolerance interfering with progress. Required 2 person assist for ambulation for safety. Pt with impaired cognition and awareness of deficits contributing to fall risk. Recommending SNF as pt does not have 24 hour assist of his family at home. Will continue to follow.  Follow Up Recommendations  SNF;Supervision/Assistance - 24 hour (?family support at home)    Equipment Recommendations  3 in 1 bedside comode    Recommendations for Other Services      Precautions / Restrictions Precautions Precautions: Fall Restrictions Weight Bearing Restrictions: No       Mobility Bed Mobility               General bed mobility comments: pt in chair  Transfers Overall transfer level: Needs assistance Equipment used: Rolling walker (2 wheeled) Transfers: Sit to/from Stand Sit to Stand: Min assist;+2 physical assistance;+2 safety/equipment         General transfer comment: tremors worse today and impacting balance/coordination as coming to standing; poor ability to control return to sitting    Balance     Sitting balance-Leahy Scale: Fair     Standing balance support: Bilateral upper extremity supported Standing balance-Leahy Scale: Poor Standing balance comment: Dependent on UE support; vc for extending hips and knees                   ADL Overall  ADL's : Needs assistance/impaired     Grooming: Wash/dry face;Wash/dry hands;Maximal assistance;Sitting                               Functional mobility during ADLs: +2 for physical assistance;+2 for safety/equipment;Minimal assistance;Cueing for sequencing;Cueing for safety;Rolling walker General ADL Comments: pt with c/o chest pain (RN aware) and nausea after walking, falling asleep shortly upon return to chair      Vision                     Perception     Praxis      Cognition   Behavior During Therapy: Flat affect Overall Cognitive Status: No family/caregiver present to determine baseline cognitive functioning Area of Impairment: Memory;Following commands;Safety/judgement;Problem solving;Attention;Awareness   Current Attention Level: Sustained Memory: Decreased short-term memory  Following Commands: Follows one step commands with increased time Safety/Judgement: Decreased awareness of safety;Decreased awareness of deficits Awareness:  (pre-intellectual) Problem Solving: Slow processing;Requires verbal cues;Requires tactile cues;Difficulty sequencing General Comments: Patient requires vc for sequencing coming to stand and return to sitting.; as clearly unable to continue walking (tremors and legs giving away) he continued to state he could walk the remaining 25 ft to his chair    Extremity/Trunk Assessment               Exercises     Shoulder Instructions       General Comments      Pertinent  Vitals/ Pain       Pain Assessment: 0-10 Pain Score: 9  Pain Location: chest and head Pain Descriptors / Indicators: Aching Pain Intervention(s): Monitored during session;Limited activity within patient's tolerance;Repositioned;Patient requesting pain meds-RN notified  Home Living                                          Prior Functioning/Environment              Frequency  Min 2X/week        Progress Toward  Goals  OT Goals(current goals can now be found in the care plan section)  Progress towards OT goals: Not progressing toward goals - comment (weakness, tremor, pain)  Acute Rehab OT Goals Patient Stated Goal: did not state Time For Goal Achievement: 01/05/16 Potential to Achieve Goals: Fair  Plan Discharge plan needs to be updated    Co-evaluation    PT/OT/SLP Co-Evaluation/Treatment: Yes Reason for Co-Treatment: For patient/therapist safety   OT goals addressed during session: ADL's and self-care      End of Session Equipment Utilized During Treatment: Rolling walker;Gait belt   Activity Tolerance Patient limited by fatigue;Patient limited by pain;Patient limited by lethargy   Patient Left in chair;with call bell/phone within reach;with chair alarm set;with nursing/sitter in room   Nurse Communication Patient requests pain meds;Mobility status        Time: 1110-1130 OT Time Calculation (min): 20 min  Charges: OT General Charges $OT Visit: 1 Procedure OT Treatments $Self Care/Home Management : 8-22 mins  Evern BioMayberry, Iliana Hutt Lynn 12/31/2015, 2:25 PM  [phone]

## 2015-12-31 NOTE — Care Management Note (Signed)
Case Management Note  Patient Details  Name: [name] [name] MRN: [id number] Date of Birth: 20-Feb-1966  Subjective/Objective:       Admitted with Hematemesis             Action/Plan: CM talked to patient via interpreter Garcella Patient lives with his family; follow [facility]. He can get his prescription filled there at discharge also. Patient's Medicaid card expired and he is in the process of having it renewed. [facility]. [facility]na with [facility]. [facility]  Address [address] [place] [id number] [phone]  Expected Discharge Date:  12/31/15               Expected Discharge Plan:  Home w Home Health Services  Discharge planning Services  CM Consult  Choice offered to:  Patient, Sibling  HH [facility]  Status of Service:  In process, will continue to follow  Reola MosherChandler, Annalei Friesz L, RN,MHA,BSN [phone] 12/31/2015, 3:44 PM

## 2016-01-08 ENCOUNTER — Ambulatory Visit: Payer: Self-pay | Admitting: Family Medicine

## 2016-03-31 ENCOUNTER — Emergency Department (HOSPITAL_COMMUNITY)
Admission: EM | Admit: 2016-03-31 | Discharge: 2016-03-31 | Disposition: A | Discharge status: Home / Self Care | Payer: Self-pay | Attending: Emergency Medicine | Admitting: Emergency Medicine

## 2016-03-31 ENCOUNTER — Emergency Department (HOSPITAL_COMMUNITY): Payer: Self-pay

## 2016-03-31 ENCOUNTER — Encounter (HOSPITAL_COMMUNITY): Payer: Self-pay | Admitting: *Deleted

## 2016-03-31 DIAGNOSIS — I5031 Acute diastolic (congestive) heart failure: Secondary | ICD-10-CM | POA: Insufficient documentation

## 2016-03-31 DIAGNOSIS — I209 Angina pectoris, unspecified: Secondary | ICD-10-CM | POA: Insufficient documentation

## 2016-03-31 DIAGNOSIS — I110 Hypertensive heart disease with heart failure: Secondary | ICD-10-CM | POA: Insufficient documentation

## 2016-03-31 DIAGNOSIS — Z87891 Personal history of nicotine dependence: Secondary | ICD-10-CM | POA: Insufficient documentation

## 2016-03-31 DIAGNOSIS — Z7984 Long term (current) use of oral hypoglycemic drugs: Secondary | ICD-10-CM | POA: Insufficient documentation

## 2016-03-31 DIAGNOSIS — Z79899 Other long term (current) drug therapy: Secondary | ICD-10-CM | POA: Insufficient documentation

## 2016-03-31 DIAGNOSIS — R51 Headache: Secondary | ICD-10-CM | POA: Insufficient documentation

## 2016-03-31 DIAGNOSIS — E119 Type 2 diabetes mellitus without complications: Secondary | ICD-10-CM | POA: Insufficient documentation

## 2016-03-31 DIAGNOSIS — R519 Headache, unspecified: Secondary | ICD-10-CM

## 2016-03-31 LAB — I-STAT TROPONIN, ED
TROPONIN I, POC: 0 ng/mL (ref 0.00–0.08)
TROPONIN I, POC: 0 ng/mL (ref 0.00–0.08)

## 2016-03-31 LAB — BASIC METABOLIC PANEL
ANION GAP: 12 (ref 5–15)
BUN: <5 mg/dL — ABNORMAL LOW (ref 6–20)
CALCIUM: 8.7 mg/dL — AB (ref 8.9–10.3)
CO2: 25 mmol/L (ref 22–32)
Chloride: 104 mmol/L (ref 101–111)
Creatinine, Ser: 0.44 mg/dL — ABNORMAL LOW (ref 0.61–1.24)
Glucose, Bld: 325 mg/dL — ABNORMAL HIGH (ref 65–99)
POTASSIUM: 3.3 mmol/L — AB (ref 3.5–5.1)
Sodium: 141 mmol/L (ref 135–145)

## 2016-03-31 LAB — CBC
HCT: 42.4 % (ref 39.0–52.0)
HEMOGLOBIN: 15.3 g/dL (ref 13.0–17.0)
MCH: 34.8 pg — ABNORMAL HIGH (ref 26.0–34.0)
MCHC: 36.1 g/dL — ABNORMAL HIGH (ref 30.0–36.0)
MCV: 96.4 fL (ref 78.0–100.0)
Platelets: 48 10*3/uL — ABNORMAL LOW (ref 150–400)
RBC: 4.4 MIL/uL (ref 4.22–5.81)
RDW: 14.2 % (ref 11.5–15.5)
WBC: 3.8 10*3/uL — AB (ref 4.0–10.5)

## 2016-03-31 LAB — CBG MONITORING, ED: Glucose-Capillary: 300 mg/dL — ABNORMAL HIGH (ref 65–99)

## 2016-03-31 LAB — PROTIME-INR
INR: 1.35
PROTHROMBIN TIME: 16.8 s — AB (ref 11.4–15.2)

## 2016-03-31 MED ORDER — CARVEDILOL 3.125 MG PO TABS: 3.1250 mg | ORAL_TABLET | Freq: Two times a day (BID) | ORAL | 0 refills | Status: DC

## 2016-03-31 MED ORDER — METOCLOPRAMIDE HCL 5 MG/ML IJ SOLN: 10.0000 mg | Freq: Once | INTRAMUSCULAR | Status: DC

## 2016-03-31 MED ORDER — CHLORDIAZEPOXIDE HCL 5 MG PO CAPS
25.0000 mg | ORAL_CAPSULE | Freq: Once | ORAL | Status: AC
Start: 2016-03-31 — End: 2016-03-31
  Administered 2016-03-31: 25 mg via ORAL
  Filled 2016-03-31: qty 5

## 2016-03-31 MED ORDER — METFORMIN HCL 1000 MG PO TABS: 1000.0000 mg | ORAL_TABLET | Freq: Two times a day (BID) | ORAL | 0 refills | Status: DC

## 2016-03-31 MED ORDER — FAMOTIDINE 20 MG PO TABS: 20.0000 mg | ORAL_TABLET | Freq: Two times a day (BID) | ORAL | 0 refills | Status: DC

## 2016-03-31 MED ORDER — METOCLOPRAMIDE HCL 5 MG/ML IJ SOLN
10.0000 mg | Freq: Once | INTRAMUSCULAR | Status: AC
  Administered 2016-03-31: 10 mg via INTRAMUSCULAR
  Filled 2016-03-31: qty 2

## 2016-03-31 MED FILL — CARVEDILOL 3.125 MG TABLET: 3.125 | 15 days supply | Qty: 30 | Fill #0

## 2016-03-31 MED FILL — metFORMIN HCL 1000 MG TABS: 1000 | 15 days supply | Qty: 30 | Fill #0

## 2016-03-31 MED FILL — FAMOTIDINE 20 MG TABLET: 20 | 15 days supply | Qty: 30 | Fill #0

## 2016-03-31 NOTE — ED Notes (Signed)
Case management at bedside.

## 2016-03-31 NOTE — ED Notes (Signed)
Pt. Given walker and prescription medications by case management.

## 2016-03-31 NOTE — Discharge Instructions (Signed)
See the doctors that we have helped you get appointment with. Take the medicine we filled for you.  Please return to the ER if you have worsening chest pain, shortness of breath, pain radiating to your jaw, shoulder, or back, sweats or fainting. Otherwise see the Cardiologist or your primary care doctor as requested.  Please return to the ER if the headache gets severe and in not improving, you have associated new one sided numbness, tingling, weakness or confusion, seizures, poor balance or poor vision.

## 2016-03-31 NOTE — ED Provider Notes (Signed)
[facility]-EMERGENCY DEPT Provider Note   CSN: [id number] Arrival date & time: 03/31/16  1156     History   Chief Complaint Chief Complaint  Patient presents with  . Chest Pain    HPI [name] [name] is a 51 y.o. male.  HPI  TRANSLATOR SERVICE USED 51 y.o. Pt with hx of diabetes, hypertension, alcohol abuse and liver cirrhosis, Varices, ischemic [facility] but not a candidate for stress test given thrombocytopenia comes in with cc of chest pain and headaches.  Chest pain is L sided, 7/10, throbbing, going on for > 20 days. Pt has some dib. Pt denies any specific aggravating or relieving factors.  Pt also has headaches. Headaches have been present for 1 week. Pt has no new associated visual complains, seizures, loss of consciousness, new weakness, or numbness, gait instability. Pt did have a fall 3-4 days ago.  Pt has been getting care at multiple hospitals and clinics. He has no way of getting to doctors.     Past Medical History:  Diagnosis Date  . Alcohol abuse   . Cirrhosis (HCC)   . Diabetes mellitus without complication (HCC)   . GERD (gastroesophageal reflux disease)   . H/O hiatal hernia   . Headache(784.0)   . No pertinent past medical history   . Seizures (HCC)   . Shortness of breath   . Sleep apnea     Patient Active Problem List   Diagnosis Date Noted  . Encephalopathy, hepatic (HCC) 12/29/2015  . Alcoholic hepatitis without ascites   . Hematemesis 12/27/2015  . Fall   . Chest pain   . Shortness of breath   . Hypoxemia 09/30/2015  . Alcohol intoxication (HCC) 09/30/2015  . Hypoxia 09/30/2015  . Acute diastolic CHF (congestive heart failure) (HCC) 11/06/2014  . History of cirrhosis of liver   . Pulmonary vascular congestion 11/05/2014  . Volume overload 11/05/2014  . Diastolic dysfunction with acute on chronic heart failure (HCC) 11/05/2014  . Abdominal pain, chronic, epigastric 11/05/2014  . Diabetes (HCC)  07/24/2013  . HTN (hypertension) 07/24/2013  . Left arm weakness 06/12/2013  . S/P cholecystectomy 06/12/2013  . Splenomegaly 06/12/2013  . Encephalopathy 06/11/2013  . Liver cirrhosis, alcoholic (HCC) 11/02/2012  . DM (diabetes mellitus) type 2, uncontrolled, with ketoacidosis (HCC) 11/02/2012  . Pain in joint, lower leg 11/02/2012  . Syncopal episodes 10/21/2012  . Abdominal pain 10/21/2012  . Alcohol abuse 01/29/2012  . Alcoholic cirrhosis (HCC) 01/29/2012  . Thrombocytopenia, secondary 01/29/2012  . DM type 2 (diabetes mellitus, type 2) (HCC) 01/29/2012  . Esophageal varices in alcoholic cirrhosis (HCC) 01/29/2012  . History of GI bleed 01/29/2012    Past Surgical History:  Procedure Laterality Date  . APPENDECTOMY    . NO PAST SURGERIES    . RADIOLOGY WITH ANESTHESIA  01/30/2012   Procedure: RADIOLOGY WITH ANESTHESIA;  Surgeon: Casimiro NeedleMichael T. Miles CostainShick, MD;  Location: [facility] OR;  Service: Radiology;  Laterality: N/A;       Home Medications    Prior to Admission medications   Medication Sig Start Date End Date Taking? Authorizing Provider  acetaminophen (TYLENOL) 325 MG tablet Take 650 mg by mouth every 6 (six) hours as needed.   Yes Historical Provider, MD  folic acid (FOLVITE) 1 MG tablet Take 1 tablet (1 mg total) by mouth daily. 01/01/16  Yes Richarda OverlieNayana Abrol, MD  lactulose (CHRONULAC) 10 GM/15ML solution Take 45 mLs (30 g total) by mouth 3 (three) times daily. 12/31/15  Yes Richarda Overlie, MD  Magnesium Oxide 400 (240 Mg) MG TABS Take 1 tablet by mouth 2 (two) times daily. 12/31/15  Yes Richarda Overlie, MD  pantoprazole (PROTONIX) 40 MG tablet Take 1 tablet (40 mg total) by mouth daily. 12/31/15  Yes Richarda Overlie, MD  thiamine 100 MG tablet Take 1 tablet (100 mg total) by mouth daily. 01/01/16  Yes Richarda Overlie, MD  carvedilol (COREG) 3.125 MG tablet Take 1 tablet (3.125 mg total) by mouth 2 (two) times daily with a meal. 03/31/16   Derwood Kaplan, MD  famotidine (PEPCID) 20 MG tablet  Take 1 tablet (20 mg total) by mouth 2 (two) times daily. 03/31/16   Derwood Kaplan, MD  metFORMIN (GLUCOPHAGE) 1000 MG tablet Take 1 tablet (1,000 mg total) by mouth 2 (two) times daily. 03/31/16   Derwood Kaplan, MD    Family History Family History  Problem Relation Age of Onset  . Hypertension Mother   . Syncope episode Sister     Social History Social History  Substance Use Topics  . Smoking status: Former Smoker    Packs/day: 0.25    Years: 10.00    Types: Cigarettes    Quit date: 09/30/2007  . Smokeless tobacco: Never Used  . Alcohol use Yes     Comment: 10/01/2014  "  i USUALLY DRINK A 12 PK ON THE WEEKENDS "     Allergies   Patient has no known allergies.   Review of Systems Review of Systems  ROS 10 Systems reviewed and are negative for acute change except as noted in the HPI.    Physical Exam Updated Vital Signs BP 152/79   Pulse 98   Temp 98.6 F (37 C) (Oral)   Resp 17   SpO2 95%   Physical Exam  Constitutional: He is oriented to person, place, and time. He appears well-developed.  HENT:  Head: Normocephalic and atraumatic.  Eyes: Conjunctivae and EOM are normal. Pupils are equal, round, and reactive to light.  Neck: Normal range of motion. Neck supple.  Cardiovascular: Normal rate, regular rhythm and intact distal pulses.   Pulmonary/Chest: Effort normal and breath sounds normal.  Abdominal: Soft. Bowel sounds are normal. He exhibits no distension. There is no tenderness. There is no rebound and no guarding.  Neurological: He is alert and oriented to person, place, and time. No cranial nerve deficit. Coordination normal.  Gross subjective sensory loss bilateral upper and lower extremities  Skin: Skin is warm.  Nursing note and vitals reviewed.    ED Treatments / Results  Labs (all labs ordered are listed, but only abnormal results are displayed) Labs Reviewed  BASIC METABOLIC PANEL - Abnormal; Notable for the following:       Result Value     Potassium 3.3 (*)    Glucose, Bld 325 (*)    BUN <5 (*)    Creatinine, Ser 0.44 (*)    Calcium 8.7 (*)    All other components within normal limits  CBC - Abnormal; Notable for the following:    WBC 3.8 (*)    MCH 34.8 (*)    MCHC 36.1 (*)    Platelets 48 (*)    All other components within normal limits  PROTIME-INR - Abnormal; Notable for the following:    Prothrombin Time 16.8 (*)    All other components within normal limits  CBG MONITORING, ED - Abnormal; Notable for the following:    Glucose-Capillary 300 (*)    All other components within  normal limits  I-STAT TROPOININ, ED  I-STAT TROPOININ, ED    EKG  EKG Interpretation  Date/Time:  Wednesday March 31 2016 12:06:08 EST Ventricular Rate:  82 PR Interval:  208 QRS Duration: 106 QT Interval:  406 QTC Calculation: 474 R Axis:   -22 Text Interpretation:  Normal sinus rhythm Incomplete right bundle branch block Possible Inferior infarct , age undetermined Abnormal ECG No significant change since last tracing Confirmed by [facility] MD, ERIN ([id number]) on 03/31/2016 2:47:13 PM       Radiology Dg Chest 2 View  Result Date: 03/31/2016 CLINICAL DATA:  Chest pain. EXAM: CHEST  2 VIEW COMPARISON:  CT 03/25/2016.  Chest x-ray 03/25/2016 . FINDINGS: Mediastinum and hilar structures normal. Low lung volumes. Right base subsegmental atelectasis . Stable cardiomegaly. No pleural effusion or pneumothorax. Low lung volumes. Stable cardiomegaly. No acute abnormality. IMPRESSION: 1.  Low lung volumes.  Right base subsegmental atelectasis. 2. Cardiomegaly.  No evidence of pulmonary venous congestion. Electronically Signed   By: Maisie Fus  Register   On: 03/31/2016 13:01   Ct Head Wo Contrast  Result Date: 03/31/2016 CLINICAL DATA:  Bilateral arm and leg pain. Difficulty sleeping. Duration greater than 2 weeks. Fall and headache. EXAM: CT HEAD WITHOUT CONTRAST TECHNIQUE: Contiguous axial images were obtained from the base of the skull  through the vertex without intravenous contrast. COMPARISON:  12/27/2015 FINDINGS: Brain: The brainstem, cerebellum, cerebral peduncles, thalami, basal ganglia, basilar cisterns, and ventricular system appear within normal limits. No intracranial hemorrhage, mass lesion, or acute CVA. Vascular: Unremarkable Skull: Unremarkable Sinuses/Orbits: Mild chronic maxillary and ethmoid sinusitis. Other: No supplemental non-categorized findings. IMPRESSION: 1. No significant intracranial abnormality is identified. 2. Mild chronic maxillary and ethmoid sinusitis. Electronically Signed   By: Gaylyn Rong M.D.   On: 03/31/2016 16:41    Procedures Procedures (including critical care time)  Medications Ordered in ED Medications  metoCLOPramide (REGLAN) injection 10 mg (10 mg Intramuscular Given 03/31/16 1652)  chlordiazePOXIDE (LIBRIUM) capsule 25 mg (25 mg Oral Given 03/31/16 1651)     Initial Impression / Assessment and Plan / ED Course  I have reviewed the triage vital signs and the nursing notes.  Pertinent labs & imaging results that were available during my care of the patient were reviewed by me and considered in my medical decision making (see chart for details).     Pt with chest pain. Intermittent. Appears to have cardiomyopathy per previous records, but not a candidate for cath given the low platelets/ Current chest pain is atypical, but it is left sided - so angina possible. We will get trops x 2 - pt will be advised to get outpatient f/u and Cards f/u can be provided if pt is still having symptoms.   Pt also having headaches. She has liver dz. So thrombosis considered. But pt has no new focal neuro deficits. CT head is showing no bleeds. I dont think further workup is mandated at this time.  Case management consulted to get pt his meds and also ability to get to the clinic at least for the next visit - and Case mngmnt was very helpful with that.  Strict ER return precautions have been  discussed, and patient is agreeing with the plan and is comfortable with the workup done and the recommendations from the ER.   Final Clinical Impressions(s) / ED Diagnoses   Final diagnoses:  Angina pectoris (HCC)  headache, unspecified chronicity pattern, unspecified headache type    New Prescriptions Discharge Medication List as of  03/31/2016  6:42 PM    START taking these medications   Details  famotidine (PEPCID) 20 MG tablet Take 1 tablet (20 mg total) by mouth 2 (two) times daily., Starting Wed 03/31/2016, Print    metFORMIN (GLUCOPHAGE) 1000 MG tablet Take 1 tablet (1,000 mg total) by mouth 2 (two) times daily., Starting Wed 03/31/2016, Print         Derwood Kaplan, MD 04/01/16 [id number]

## 2016-03-31 NOTE — ED Triage Notes (Signed)
Pt reports left chest pain and headache. Pt reports bilateral arm and leg pain as well. Pt reports difficulty sleeping as well. Pt reports that he has had these problem more than 2 weeks. Pt was seen at [facility] 6 days ago and given lactulose

## 2016-03-31 NOTE — ED Notes (Signed)
EDP at bedside with translator

## 2016-04-07 ENCOUNTER — Inpatient Hospital Stay: Payer: Self-pay | Admitting: Internal Medicine

## 2016-04-21 ENCOUNTER — Other Ambulatory Visit: Payer: Self-pay

## 2016-04-21 ENCOUNTER — Ambulatory Visit (HOSPITAL_COMMUNITY)
Admission: RE | Admit: 2016-04-21 | Discharge: 2016-04-21 | Disposition: A | Discharge status: Home / Self Care | Payer: Self-pay | Source: Ambulatory Visit | Attending: Family Medicine | Admitting: Family Medicine

## 2016-04-21 ENCOUNTER — Ambulatory Visit: Payer: Self-pay | Attending: Internal Medicine | Admitting: Internal Medicine

## 2016-04-21 VITALS — BP 154/74 | HR 90 | Temp 98.4°F | Resp 18 | Ht 71.0 in

## 2016-04-21 DIAGNOSIS — R079 Chest pain, unspecified: Secondary | ICD-10-CM | POA: Insufficient documentation

## 2016-04-21 DIAGNOSIS — F1010 Alcohol abuse, uncomplicated: Secondary | ICD-10-CM | POA: Insufficient documentation

## 2016-04-21 DIAGNOSIS — J322 Chronic ethmoidal sinusitis: Secondary | ICD-10-CM | POA: Insufficient documentation

## 2016-04-21 DIAGNOSIS — J320 Chronic maxillary sinusitis: Secondary | ICD-10-CM | POA: Insufficient documentation

## 2016-04-21 DIAGNOSIS — Z79899 Other long term (current) drug therapy: Secondary | ICD-10-CM | POA: Insufficient documentation

## 2016-04-21 DIAGNOSIS — I8510 Secondary esophageal varices without bleeding: Secondary | ICD-10-CM | POA: Insufficient documentation

## 2016-04-21 DIAGNOSIS — I440 Atrioventricular block, first degree: Secondary | ICD-10-CM | POA: Insufficient documentation

## 2016-04-21 DIAGNOSIS — I255 Ischemic cardiomyopathy: Secondary | ICD-10-CM | POA: Insufficient documentation

## 2016-04-21 DIAGNOSIS — D6959 Other secondary thrombocytopenia: Secondary | ICD-10-CM | POA: Insufficient documentation

## 2016-04-21 DIAGNOSIS — K7030 Alcoholic cirrhosis of liver without ascites: Secondary | ICD-10-CM | POA: Insufficient documentation

## 2016-04-21 DIAGNOSIS — Z7984 Long term (current) use of oral hypoglycemic drugs: Secondary | ICD-10-CM | POA: Insufficient documentation

## 2016-04-21 DIAGNOSIS — E118 Type 2 diabetes mellitus with unspecified complications: Secondary | ICD-10-CM | POA: Insufficient documentation

## 2016-04-21 DIAGNOSIS — H538 Other visual disturbances: Secondary | ICD-10-CM | POA: Insufficient documentation

## 2016-04-21 DIAGNOSIS — K219 Gastro-esophageal reflux disease without esophagitis: Secondary | ICD-10-CM | POA: Insufficient documentation

## 2016-04-21 LAB — POCT GLYCOSYLATED HEMOGLOBIN (HGB A1C): Hemoglobin A1C: 6.4

## 2016-04-21 LAB — GLUCOSE, POCT (MANUAL RESULT ENTRY): POC Glucose: 316 mg/dl — AB (ref 70–99)

## 2016-04-21 MED ORDER — CARVEDILOL 3.125 MG PO TABS: 3.1250 mg | ORAL_TABLET | Freq: Two times a day (BID) | ORAL | 3 refills | Status: AC

## 2016-04-21 MED ORDER — FOLIC ACID 1 MG PO TABS: 1.0000 mg | ORAL_TABLET | Freq: Every day | ORAL | 3 refills | Status: AC

## 2016-04-21 MED ORDER — PANTOPRAZOLE SODIUM 40 MG PO TBEC: 40.0000 mg | DELAYED_RELEASE_TABLET | Freq: Every day | ORAL | 3 refills | Status: AC

## 2016-04-21 MED ORDER — THIAMINE HCL 100 MG PO TABS: 100.0000 mg | ORAL_TABLET | Freq: Every day | ORAL | 3 refills | Status: DC

## 2016-04-21 MED ORDER — METFORMIN HCL 1000 MG PO TABS
1000.0000 mg | ORAL_TABLET | Freq: Two times a day (BID) | ORAL | 3 refills | Status: AC
Start: 2016-04-21 — End: ?

## 2016-04-21 MED ORDER — LACTULOSE 10 GM/15ML PO SOLN: 30.0000 g | Freq: Three times a day (TID) | ORAL | 3 refills | Status: AC

## 2016-04-21 NOTE — Progress Notes (Signed)
Patient is here for HFU  Patient complains of headache being present intermittently.  Patient states HA's are present in the temporal and in the back of the head.  Patient has taken medication today. Patient has eaten today.

## 2016-04-21 NOTE — Patient Instructions (Addendum)
Consumo excesivo de alcohol y nutricin (Alcohol Abuse and Nutrition) El consumo excesivo de alcohol es cualquier patrn de consumo de alcohol que perjudique la salud, las relaciones o [place]. Este trastorno Orthoptist forma en que el organismo descompone y absorbe los nutrientes de los [place], lo que causa el funcionamiento anormal del hgado. Adems, muchas personas que consumen alcohol en exceso no ingieren la cantidad suficiente de carbohidratos, protenas, grasas, vitaminas y minerales. Esto puede causar desnutricin y la falta de nutrientes (carencias nutritivas), lo que tal vez derive en otras complicaciones. Los nutrientes que suelen ser deficitarios en las personas que consumen alcohol en exceso incluyen lo siguiente:  Vitaminas.  VitaminaA, que se almacena en el hgado y es importante para la visin, el metabolismo y la capacidad para combatir las infecciones (inmunidad).  VitaminasB, que incluyen las vitaminas tales como el folato, la tiamina y la niacina, importantes en el crecimiento y la conservacin de las clulas nuevas.  VitaminaC, que desempea un papel importante en la absorcin del hierro, la cicatrizacin de las heridas y la inmunidad.  VitaminaD, que es producida por el hgado y que tambin se puede obtener de los alimentos. La vitaminaD es necesaria para que el organismo absorba y use el calcio.  Minerales.  Calcio. Es importante para los [place], la actividad cardaca y el funcionamiento de los vasos sanguneos (actividad cardiovascular).  Hierro. Es importante para la [place], los msculos y el funcionamiento del sistema nervioso.  Magnesio. Desempea un papel importante en el funcionamiento muscular y nervioso, y [place] a Sales executive nivel de azcar en la sangre y la presin arterial.  Zinc. Es importante para el normal funcionamiento del sistema nervioso y el aparato digestivo (tubo digestivo). La nutricin es un componente fundamental del  tratamiento para el consumo excesivo de alcohol. El mdico o el nutricionista trabajarn con usted para disear un plan que pueda ayudar a que el organismo recupere los nutrientes y a Occupational psychologist. EN QU CONSISTE EL PLAN? El nutricionista Airline pilot un plan de alimentacin especfico en funcin de su afeccin y de otras complicaciones que pueda Warehouse manager. Generalmente, un plan de alimentacin incluye lo siguiente:  Neomia Dear dieta equilibrada.  Cereales: de 6 a 8 onzas por Futures trader.  Verduras: de 2 a Scientist, research (life sciences).  Nils Pyle: de 1 a 2tazas Air cabin crew.  Carne y otras protenas: de 5 a Health visitor.  Lcteos: de 2 a Scientist, research (life sciences).  Suplementos de vitaminas y minerales QU DEBO SABER ACERCA DEL ALCOHOL Y LA NUTRICIN?  Consuma alimentos con alto contenido de antioxidantes, como uvas, frutos rojos, frutos secos, t verde y verduras de [employer] oscuro y anaranjado. Esto puede ayudar a Field seismologist de la agresin que sufre el hgado cuando se consume alcohol.  No ingiera alimentos y bebidas con alto contenido de [place] y International aid/development worker. Las bebidas gaseosas azucaradas, las colaciones saladas y los dulces contienen caloras vacas, lo que significa que carecen de nutrientes importantes, como las protenas, las fibras y las vitaminas.  Ingiera comidas y colaciones con frecuencia. Intente hacer 5 o 6comidas pequeas por da.  Coma diariamente una variedad de frutas y verduras frescas, lo que ayudar a aportar una gran cantidad de [place], [place] y vitaminas a su dieta.  Birdie Hopes agua y otros lquidos transparentes. Intente beber por lo menos de 48 a 64onzas (de 1,5 a 2l) de Warehouse manager.  Si es vegetariano, consuma diferentes alimentos con alto contenido de protenas. Combine los granos integrales con  las protenas vegetales en las comidas y las colaciones, para obtener el mximo aporte de nutrientes de los alimentos. Por ejemplo, coma arroz con frijoles, unte una tostada  integral con [place] de man o ingiera avena con semillas de girasol.  Antes de cocinarlos, remoje los frijoles y los granos integrales durante la noche. Esto puede ayudar a que [employer] Corporation nutrientes con ms facilidad.  Incluya en su dieta alimentos fortificados con vitaminas y minerales. Los alimentos que suelen estar fortificados son, Adella Nissen, la [place], el jugo de [place], los cereales y el pan.  Si est desnutrido, el nutricionista puede recomendarle una dieta hipercalrica con alto contenido de protenas. Esto puede incluir lo siguiente:  Tommi Rumps 2000 a 3000caloras (kilocaloras) por Futures trader.  De 70 a 100gramos de protenas Air cabin crew.  El mdico puede recomendarle un suplemento nutricional bebible completo, que puede ayudar a que el organismo recupere las caloras, las protenas y las vitaminas. En funcin de su afeccin, tal vez le indiquen que consuma este suplemento, en lugar de que coma o lo agregue a las comidas.  Limite el consumo de cafena. En lugar de caf y t negro, tome caf descafeinado y t de hierbas.  Coma alimentos variados con alto contenido de cidos grasos omega, entre ellos, pescado, frutos secos y Surveyor, minerals, y porotos de soja. Estos alimentos pueden ayudar a que el hgado se recupere y, adems, a Freight forwarder de nimo.  Ciertos medicamentos pueden causar cambios en el apetito, el sentido del gusto y [place]. Trabaje con el mdico y el nutricionista para hacer ajustes en los medicamentos que toma y en el plan de alimentacin.  Incluya en su rutina diaria otras opciones para llevar un estilo de vida saludable.  Haga actividad fsica.  Duerma lo suficiente.  Dedique tiempo a Education officer, environmental las [employer].  Si no puede ingerir la cantidad suficiente de comida y caloras por boca, el mdico puede recomendar que le coloquen una sonda de alimentacin. Se trata de una sonda que se coloca a travs de la Clinical cytogeneticist y Radio producer al  [employer] and An[employer]ity Association. Las bebidas nutricionales complementarias pueden administrarse a travs de la sonda de Paediatric nurse, a fin de ayudarlo a que [employer] nutrientes que necesita.  Tome los suplementos vitamnicos o minerales como se lo haya indicado el mdico. QU ALIMENTOS PUEDO COMER? Cereales  Pasta enriquecida. Arroz enriquecido. Pan integral fortificado. Cereales integrales fortificados. Cebada. Arroz integral. Baldo Ash. Mijo. Verduras  Verduras frescas, congeladas y Primary school teacher. [place]spin[place]. Col rizada. Alcachofas. Zanahorias. Zapallo y calabaza. Batatas. Brcoli. Repollo. Pepinos. Tomates. Pimientos morrones. Judas verdes. Guisantes. Maz. Frutas  Todas las frutas frescas y Academic librarian. Frutos rojos. Uvas. Mango. Papaya. Guayaba. Cerezas. Manzanas. Bananas. Duraznos. Ciruelas. Pia. Sanda. Meln. Naranjas. Aguacate. Carnes y otras fuentes de protenas  Hgado de res. Carnes magras. Cerdo. Pollo fresco y Soil scientist. Pescado fresco. Susie Cassette. Sardinas. Atn en lata. Camarones. Huevos con las yemas. Frutos secos y semillas. Mantequilla de man. Porotos y lentejas. Soja. Tofu. Lcteos  Leche entera, con bajo contenido de [place] y sin Holiday representative. Yogur entero, con bajo contenido de [place]) y sin grasa. [employer]. PPG Industries. Quesos duros y blandos. [employer]ning. T de hierbas. Caf descafeinado. T verde descafeinado. Jugo de frutas al 100%. Jugo de verduras al 100%. Batidos instantneos para el desayuno. Condimentos  [place]. Mayonesa. Mostaza. Condimento para ensalada. Salsa barbacoa. Dulces y postres  Helado sin azcar. Flan sin azcar. Gelatina sin azcar. Grasas y [employer] Corporation. Aceite vegetal, aceite de lino, aceite  de [place]oliva y Serbiaaceite de nuez. Otros  Batidos nutricionales completos. Barras de protenas. Goma de Theatre managermascar sin International aid/development workerazcar. Los artculos mencionados arriba pueden no ser Raytheonuna lista completa de las bebidas o los alimentos recomendados. Comunquese con el nutricionista para conocer ms  opciones.  QU ALIMENTOS NO SE RECOMIENDAN? Cereales  Cereales para el desayuno endulzados con azcar. Avena instantnea saborizada. Panes fritos. Verduras  Verduras empanizadas o fritas. Nils PyleFrutas  Frutas desecadas con agregado de azcar. Frutas confitadas. Frutas enlatadas con almbar. Carnes y otras fuentes de protenas  Carnes empanizadas o fritas. Lcteos  Leches saborizadas. Requesn frito o palitos de queso fritos. Bebidas  Alcohol. Refrescos endulzados con azcar. T endulzado con azcar. T y caf con cafena. Condimentos  Azcar. Miel. Miel de agave. Melaza. Dulces y postres  Chocolate. Pasteles. Galletas. Caramelos. Otros  Papas fritas. Pretzels. Frutos secos con sal. Pralin de frutos secos. Los artculos mencionados arriba pueden no ser Raytheonuna lista completa de las bebidas y los alimentos que se Theatre stage managerdeben evitar. Comunquese con el nutricionista para obtener ms informacin.  Esta informacin no tiene Theme park managercomo fin reemplazar el consejo del mdico. Asegrese de hacerle al mdico cualquier pregunta que tenga. Document Released: 08/12/2009 Document Revised: 03/15/2014 Document Reviewed: 09/25/2013 Elsevier Interactive Patient Education  2017 Elsevier Inc.  Sinusitis en adultos (Sinusitis, Adult) La sinusitis es la inflamacin y Chief Technology Officerel dolor en los senos paranasales. Los senos paranasales son espacios vacos en los huesos alrededor del rostro. Los senos paranasales se encuentran ubicados:  Alrededor de los ojos.  En la mitad de la frente.  Detrs de Architectural technologistla nariz.  En los pmulos. Los senos y las fosas nasales estn cubiertos de un lquido fibroso (mucosidad). Normalmente, la mucosidad drena a travs de los senos. Cuando los tejidos nasales se inflaman o hinchan, la mucosidad puede quedar atrapada o bloqueada, de modo que no puede fluir por los senos paranasales. Esto fomenta la proliferacin de bacterias, virus y hongos, lo que produce infecciones. La sinusitis puede desarrollarse  rpidamente y durar entre 7 y 10das (aguda) o ms de 12das (crnica). A menudo, esta afeccin surge despus de un resfriado. CAUSAS Esta afeccin es causada por cualquier sustancia que inflame los senos o evite que la mucosidad drene, por ejemplo:  Alergias.  Asma.  Infecciones virales o bacterianas.  Huesos con forma Fiservanmala entre las fosas nasales.  Crecimientos nasales que contienen mucosidad (plipos nasales).  Aberturas sinusales estrechas.  Agentes contaminantes, como sustancias qumicas o irritantes presentes en el aire.  Un cuerpo extrao atorado Thrivent Financialen la nariz.  Infecciones por hongos. Esto es raro. FACTORES DE RIESGO Los siguientes factores pueden hacer que usted sea propenso a sufrir esta afeccin:  Archivistadecer alergias o asma.  Haber tenido una infeccin reciente en las vas respiratorias superiores o un resfriado.  Tener deformidades estructurales o bloqueos en la nariz o los senos.  Tener un sistema inmunitario dbil.  Nadar o bucear mucho.  Abusar de los Erie Insurance Groupaerosoles nasales.  Fumar. SNTOMAS Los principales sntomas de esta afeccin son dolor y sensacin de presin alrededor de los senos afectados. Otros sntomas pueden ser los siguientes:  Dolor en los dientes superiores.  Dolor de odos.  Dolor de Turkmenistancabeza.  Mal aliento.  Disminucin del sentido del olfato y del gusto.  Tos que empeora por la noche.  Fatiga.  Grant RutsFiebre.  Mucosidad espesa que sale de la Clinical cytogeneticistnariz. Generalmente, es de color verde y puede contener pus (purulento).  Nariz tapada o congestin nasal.  Goteo posnasal. Esto ocurre cuando se acumula mucosidad adicional  en la garganta o la parte de atrs de la nariz.  Hinchazn y calor en los senos paranasales afectados.  Dolor de Advertising copywriter.  Sensibilidad a Statistician. DIAGNSTICO Esta enfermedad se diagnostica en funcin de los sntomas, los antecedentes mdicos y un examen fsico. Para descubrir si su afeccin es aguda o crnica, el mdico  podra hacer lo siguiente:  Revisar su nariz en busca de plipos nasales.  Palpar los senos paranasales afectados para buscar signos de infeccin.  Observar la parte interna de los senos paranasales con un dispositivo que tiene una luz (endoscopio). Si el mdico sospecha que usted padece sinusitis crnica, podra indicarle lo siguiente:  Pruebas de alergias.  Una muestra de mucosidad de la nariz (cultivo nasal) para buscar bacterias.  Examen de [place] de mucosidad, para ver si la sinusitis se relaciona con alguna alergia. Si la sinusitis no responde al tratamiento y dura ms de 8semanas, se le podra pedir una resonancia magntica o una tomografa computarizada para examinar los senos paranasales. Estos estudios tambin ayudan a Production assistant, radio gravedad de la infeccin. En contadas ocasiones, se puede ordenar una biopsia de hueso para descartar tipos ms graves de infecciones por hongos en los senos paranasales. TRATAMIENTO El tratamiento para la sinusitis depende de la causa y de si la afeccin es [place]. Si lo que causa la sinusitis es un virus, los sntomas desparecern por s solos en el trmino de 10das. Podran recetarle medicamentos para [employer], entre los que se incluyen los siguientes:  Descongestivos nasales tpicos. Desinflaman las fosas nasales y permiten que la mucosidad drene por los senos paranasales.  Antihistamnicos. Este tipo de medicamento bloquea la inflamacin que ocasionan las [place]. Pueden ayudar a reducir la inflamacin en la nariz y los senos.  Corticoides nasales tpicos. Son aerosoles nasales que reducen la inflamacin e hinchazn en la Darene Lamer y los senos.  Lavados nasales con solucin salina. Estos enjuagues pueden ayudar a eliminar la mucosidad espesa en la nariz. Si la afeccin es causada por una bacteria, se le recetarn antibiticos. Si es causada por un hongo, se le recetarn antimicticos. Se podra necesitar ciruga para  tratar enfermedades preexistentes, como las fosas nasales estrechas. Tambin podra ser necesaria para eliminar plipos. INSTRUCCIONES PARA EL CUIDADO EN EL HOGAR Group 1 Automotive, use o aplquese los medicamentos de venta libre y Building control surveyor como se lo haya indicado el mdico. Estos pueden incluir aerosoles nasales.  Si le recetaron un antibitico, tmelo como se lo haya indicado el mdico. No deje de tomar los antibiticos aunque comience a Actor. Hidrtese y [employer] Oil Corporation.   Beba suficiente agua para mantener la orina clara o de color amarillo plido. Mantenerse hidratado lo ayudar a [employer].  Use un humidificador de vapor fro para mantener la humedad de su hogar por encima del 50%.  Realice inhalaciones de vapor por 10 a , de 3 a 4veces al da o tal como se lo haya indicado el mdico. Puede hacer esto en el bao con el vapor del agua caliente de la ducha.  Limite la exposicin al aire fro o seco. Reposo   Descanse todo lo que pueda.  Duerma con la cabeza elevada.  Asegrese de dormir lo suficiente cada noche. Instrucciones generales   Aplquese un pao tibio y hmedo en la cara 3 o 4veces al da o como se lo haya indicado el mdico. Esto ayuda a Optician, dispensing las [place].  Lvese las manos frecuentemente con agua y [place]  para reducir la exposicin a virus y otras bacterias. Use desinfectante para manos si no dispone de [place] y [place].  No fume. Evite estar cerca de personas que fuman (fumador pasivo).  Concurra a todas las visitas de control como se lo haya indicado el mdico. Esto es importante. SOLICITE ATENCIN MDICA SI:  Lance Muss.  Los sntomas empeoran.  Los sntomas no mejoran en el trmino de 10das. SOLICITE ATENCIN MDICA DE INMEDIATO SI:  Tiene un dolor de cabeza intenso.  Tiene vmitos persistentes.  Tiene dolor o hinchazn en la zona del rostro o los ojos.  Tiene problemas de visin.  Se  siente confundido.  Tiene el cuello rgido.  Tiene dificultad para respirar. Esta informacin no tiene Theme park manager el consejo del mdico. Asegrese de hacerle al mdico cualquier pregunta que tenga. Document Released: 12/02/2004 Document Revised: 06/16/2015 Document Reviewed: 12/18/2014 Elsevier Interactive Patient Education  2017 [employer].

## 2016-04-21 NOTE — Progress Notes (Signed)
Subjective:    Patient ID: [name] [name], male    DOB: 06/07/1965, 51 y.o.   MRN: [id number]  HPI [name] [name] is a 51 y.o. male here today for a follow up visit. Patient has extensive medical history of ETOH abuse, alcoholic cirrhosis, secondary thrombocytopenia, type 2 diabetes, esophageal varices, ischemic cardiomyopathy- not a candidate for intervention due to thrombocytopenia, seen in the ER  3 weeks ago for chest pain and headache. CT of the head at that visit showed mild chronic maxillary and ethmoid sinusitis, but no significant intracranial abnormality was identified. EKG and cardiac enzymes were also completed.  He presents today with headache and chest pain. It is associated with shortness of breath which is worse on exertion. The headache started since his visit to the ER and has not gotten any better. The headache is associated with dizziness, had a syncopal episode 22 days ago- was taken to a hospital in [place]. Denies alcohol use, but still smokes occasionally, used to smoke 10 sticks of cigarette per day for more than 30 years. Endorse having nausea but no vomiting. Has had diarrhea for the past 2 days. Denies fever but endorse having chills. Denies suicide or homicidal.l ideation.  Past Medical History:  Diagnosis Date  . Alcohol abuse   . Cirrhosis (HCC)   . Diabetes mellitus without complication (HCC)   . GERD (gastroesophageal reflux disease)   . H/O hiatal hernia   . Headache(784.0)   . No pertinent past medical history   . Seizures (HCC)   . Shortness of breath   . Sleep apnea    Current Outpatient Prescriptions on File Prior to Visit  Medication Sig Dispense Refill  . acetaminophen (TYLENOL) 325 MG tablet Take 650 mg by mouth every 6 (six) hours as needed.    . carvedilol (COREG) 3.125 MG tablet Take 1 tablet (3.125 mg total) by mouth 2 (two) times daily with a meal. 30 tablet 0  . famotidine (PEPCID) 20 MG tablet Take 1 tablet (20 mg total) by mouth 2 (two)  times daily. 30 tablet 0  . folic acid (FOLVITE) 1 MG tablet Take 1 tablet (1 mg total) by mouth daily. 30 tablet 1  . lactulose (CHRONULAC) 10 GM/15ML solution Take 45 mLs (30 g total) by mouth 3 (three) times daily. 240 mL 1  . Magnesium Oxide 400 (240 Mg) MG TABS Take 1 tablet by mouth 2 (two) times daily. 60 tablet 1  . metFORMIN (GLUCOPHAGE) 1000 MG tablet Take 1 tablet (1,000 mg total) by mouth 2 (two) times daily. 30 tablet 0  . pantoprazole (PROTONIX) 40 MG tablet Take 1 tablet (40 mg total) by mouth daily. 30 tablet 1  . thiamine 100 MG tablet Take 1 tablet (100 mg total) by mouth daily. 30 tablet 1   No current facility-administered medications on file prior to visit.    No Known Allergies  Review of Systems  Constitutional: Positive for chills. Negative for fever and unexpected weight change.  HENT: Positive for sinus pain (maxilary). Negative for sore throat and trouble swallowing.   Eyes: Positive for visual disturbance (blurry vision). Negative for photophobia and pain.  Respiratory: Positive for shortness of breath (with activity). Negative for cough and wheezing.   Cardiovascular: Positive for leg swelling. Negative for palpitations.  Gastrointestinal: Positive for nausea. Negative for abdominal distention, abdominal pain and vomiting.  Endocrine: Negative.   Genitourinary: Negative for dysuria.  Neurological: Positive for dizziness, light-headedness and headaches. Negative for speech difficulty, weakness and  numbness.  Psychiatric/Behavioral: Positive for sleep disturbance. Negative for self-injury and suicidal ideas.      Objective:  BP (!) 154/74 (BP Location: Left Arm, Patient Position: Sitting, Cuff Size: Large)   Pulse 90   Temp 98.4 F (36.9 C) (Oral)   Resp 18   Ht 5\' 11"  (1.803 m)   SpO2 96%   [facility] 04/21/2016 11/18/2014 11/18/2014  Decreased Interest - 1 0  Down, Depressed, Hopeless 3 1 0  PHQ - 2 Score 3 2 0  Altered sleeping 2 - -    Tired, decreased energy 3 - -  Change in appetite 2 - -  Feeling bad or failure about yourself  3 - -  Trouble concentrating 3 - -  Moving slowly or fidgety/restless 3 - -  Suicidal thoughts 2 - -  PHQ-9 Score 21 - -      Physical Exam  Constitutional: No distress.  Discheveled  HENT:  Head: Normocephalic.  Right Ear: External ear normal.  Left Ear: External ear normal.  Mouth/Throat: Oropharynx is clear and moist.  Eyes: Pupils are equal, round, and reactive to light.  conjuctiva pale  Neck: Normal range of motion. Neck supple. No thyromegaly present.  Cardiovascular: Normal rate, regular rhythm, normal heart sounds and intact distal pulses.   No murmur heard. Pulmonary/Chest: Effort normal and breath sounds normal. No respiratory distress. He exhibits tenderness (bilateral anterior chest wall tenderness).  Abdominal: Soft. Bowel sounds are normal. He exhibits no distension.  Musculoskeletal: Normal range of motion. Edema: +1 edema bilateral lower extremities.  Lymphadenopathy:    He has no cervical adenopathy.  Neurological: He is alert.  Skin: Skin is warm and dry.  Skin is pale, clubbing noted on bilateral finger and toe nails   Assessment & Plan:  1. Type 2 diabetes mellitus with complication, without long-term current use of insulin (HCC) Low carb diet discussed and encouraged - POCT A1C - Glucose (CBG) - Microalbumin/Creatinine Ratio, Urine - metFORMIN (GLUCOPHAGE) 1000 MG tablet; Take 1 tablet (1,000 mg total) by mouth 2 (two) times daily.  Dispense: 180 tablet; Refill: 3  2. Alcoholic cirrhosis of liver without ascites (HCC) Do not drink alcohol - folic acid (FOLVITE) 1 MG tablet; Take 1 tablet (1 mg total) by mouth daily.  Dispense: 90 tablet; Refill: 3 - thiamine 100 MG tablet; Take 1 tablet (100 mg total) by mouth daily.  Dispense: 90 tablet; Refill: 3 - lactulose (CHRONULAC) 10 GM/15ML solution; Take 45 mLs (30 g total) by mouth 3 (three) times daily.   Dispense: 480 mL; Refill: 3  3. Alcohol abuse  Declined consult with LSCW  patient said his ride is ready to leave  4. Chest pain, unspecified type Keep appointment with cardiology - carvedilol (COREG) 3.125 MG tablet; Take 1 tablet (3.125 mg total) by mouth 2 (two) times daily with a meal.  Dispense: 60 tablet; Refill: 3 - Ambulatory referral to Cardiology  5. GERD without esophagitis Avoid spicy foods Do not eat 2 hours prior to bedtime - pantoprazole (PROTONIX) 40 MG tablet; Take 1 tablet (40 mg total) by mouth daily.  Dispense: 90 tablet; Refill: 3  6. Blurry vision, bilateral  - Ambulatory referral to Ophthalmology  Health maintenance reviewed Diet and exercise encouraged Continue all meds Follow up  in 1 month   Reuel BoomQueeneth Dinia Joynt, RN, BSN, AGNP-Student  Evaluation and management procedures were performed by me with DNP Student in attendance, note written by DNP student under my supervision and collaboration. I  have reviewed the note and I agree with the management and plan.   Jeanann Lewandowsky, MD, MHA, CPE, FACP, FAAP [facility] [place], [place] [phone]   04/22/2016, 12:45 PM

## 2016-04-22 LAB — MICROALBUMIN / CREATININE URINE RATIO
Creatinine, Urine: 57 mg/dL (ref 20–370)
MICROALB UR: 0.4 mg/dL
MICROALB/CREAT RATIO: 7 ug/mg{creat} (ref <30)

## 2016-05-05 ENCOUNTER — Encounter: Payer: Self-pay | Admitting: *Deleted

## 2016-05-05 ENCOUNTER — Ambulatory Visit: Payer: Self-pay | Admitting: Cardiovascular Disease

## 2016-05-05 NOTE — Progress Notes (Deleted)
Cardiology Office Note   Date:  05/05/2016   ID:  [name] [name], DOB 10/24/1965, MRN [id number]  PCP:  Jeanann Lewandowsky, MD  Cardiologist:   Chilton Si, MD   No chief complaint on file.     History of Present Illness: [name] [name] is a 51 y.o. male with chronic systolic and diastolic heart failure, diabetes, hypertension, OSA, negative alcohol abuse, cirrhosis, and esophageal varices who presents for management of chest pain and syncope.  He saw    He was seen in the ED 03/31/16 chest pain. [name] [name] has known ischemic cardiomyopathy but has not been a candidate for treatment due to thrombocytopenia area and   Past Medical History:  Diagnosis Date  . Alcohol abuse   . Cirrhosis (HCC)   . Diabetes mellitus without complication (HCC)   . GERD (gastroesophageal reflux disease)   . H/O hiatal hernia   . Headache(784.0)   . No pertinent past medical history   . Seizures (HCC)   . Shortness of breath   . Sleep apnea     Past Surgical History:  Procedure Laterality Date  . APPENDECTOMY    . NO PAST SURGERIES    . RADIOLOGY WITH ANESTHESIA  01/30/2012   Procedure: RADIOLOGY WITH ANESTHESIA;  Surgeon: Casimiro Needle T. Miles Costain, MD;  Location: [facility] OR;  Service: Radiology;  Laterality: N/A;     Current Outpatient Prescriptions  Medication Sig Dispense Refill  . carvedilol (COREG) 3.125 MG tablet Take 1 tablet (3.125 mg total) by mouth 2 (two) times daily with a meal. 60 tablet 3  . famotidine (PEPCID) 20 MG tablet Take 1 tablet (20 mg total) by mouth 2 (two) times daily. 30 tablet 0  . folic acid (FOLVITE) 1 MG tablet Take 1 tablet (1 mg total) by mouth daily. 90 tablet 3  . lactulose (CHRONULAC) 10 GM/15ML solution Take 45 mLs (30 g total) by mouth 3 (three) times daily. 480 mL 3  . Magnesium Oxide 400 (240 Mg) MG TABS Take 1 tablet by mouth 2 (two) times daily. 60 tablet 1  . metFORMIN (GLUCOPHAGE) 1000 MG tablet Take 1 tablet (1,000 mg total) by mouth 2 (two) times  daily. 180 tablet 3  . pantoprazole (PROTONIX) 40 MG tablet Take 1 tablet (40 mg total) by mouth daily. 90 tablet 3  . thiamine 100 MG tablet Take 1 tablet (100 mg total) by mouth daily. 90 tablet 3   No current facility-administered medications for this visit.     Allergies:   Patient has no known allergies.    Social History:  The patient  reports that he quit smoking about 8 years ago. His smoking use included Cigarettes. He has a 2.50 pack-year smoking history. He has never used smokeless tobacco. He reports that he drinks alcohol. He reports that he does not use drugs.   Family History:  The patient's ***family history includes Hypertension in his mother; Syncope episode in his sister.    ROS:  Please see the history of present illness.   Otherwise, review of systems are positive for {NONE DEFAULTED:18576::"none"}.   All other systems are reviewed and negative.    PHYSICAL EXAM: VS:  There were no vitals taken for this visit. , BMI There is no height or weight on file to calculate BMI. GENERAL:  Well appearing HEENT:  Pupils equal round and reactive, fundi not visualized, oral mucosa unremarkable NECK:  No jugular venous distention, waveform within normal limits, carotid upstroke brisk and symmetric, no bruits, no  thyromegaly LYMPHATICS:  No cervical adenopathy LUNGS:  Clear to auscultation bilaterally HEART:  RRR.  PMI not displaced or sustained,S1 and S2 within normal limits, no S3, no S4, no clicks, no rubs, *** murmurs ABD:  Flat, positive bowel sounds normal in frequency in pitch, no bruits, no rebound, no guarding, no midline pulsatile mass, no hepatomegaly, no splenomegaly EXT:  2 plus pulses throughout, no edema, no cyanosis no clubbing SKIN:  No rashes no nodules NEURO:  Cranial nerves II through XII grossly intact, motor grossly intact throughout PSYCH:  Cognitively intact, oriented to person place and time    EKG:  EKG {ACTION; IS/IS [id number]}OT:[id number]} ordered today. The  ekg ordered today demonstrates ***   Recent Labs: 09/29/2015: B Natriuretic Peptide 18.4 09/30/2015: TSH 1.003 12/31/2015: ALT 83; Magnesium 1.9 03/31/2016: BUN <5; Creatinine, Ser 0.44; Hemoglobin 15.3; Platelets 48; Potassium 3.3; Sodium 141    Lipid Panel    Component Value Date/Time   CHOL 189 09/30/2015 2029   TRIG 123 09/30/2015 2029   HDL 66 09/30/2015 2029   CHOLHDL 2.9 09/30/2015 2029   VLDL 25 09/30/2015 2029   LDLCALC 98 09/30/2015 2029      Wt Readings from Last 3 Encounters:  12/31/15 97.8 kg (215 lb 8 oz)  09/29/15 102.1 kg (225 lb)  11/18/14 103.9 kg (229 lb)      ASSESSMENT AND PLAN:  ***   Current medicines are reviewed at length with the patient today.  The patient {ACTIONS; HAS/DOES NOT HAVE:19233} concerns regarding medicines.  The following changes have been made:  {PLAN; NO CHANGE:13088:s}  Labs/ tests ordered today include: *** No orders of the defined types were placed in this encounter.    Disposition:   FU with ***    This note was written with the assistance of speech recognition software.  Please excuse any transcriptional errors.  Signed, Jameon Deller C. Duke Salviaandolph, MD, [facility]  05/05/2016 8:06 AM    [facility]

## 2016-05-19 ENCOUNTER — Ambulatory Visit: Payer: Self-pay | Admitting: Internal Medicine

## 2016-09-04 ENCOUNTER — Emergency Department (HOSPITAL_COMMUNITY): Payer: Self-pay

## 2016-09-04 ENCOUNTER — Inpatient Hospital Stay (HOSPITAL_COMMUNITY)
Admission: EM | Admit: 2016-09-04 | Discharge: 2016-09-06 | DRG: 178 | Disposition: A | Discharge status: Home / Self Care | Payer: Self-pay | Attending: Internal Medicine | Admitting: Internal Medicine

## 2016-09-04 ENCOUNTER — Encounter (HOSPITAL_COMMUNITY): Payer: Self-pay

## 2016-09-04 DIAGNOSIS — Y908 Blood alcohol level of 240 mg/100 ml or more: Secondary | ICD-10-CM | POA: Diagnosis present

## 2016-09-04 DIAGNOSIS — F10129 Alcohol abuse with intoxication, unspecified: Secondary | ICD-10-CM | POA: Diagnosis present

## 2016-09-04 DIAGNOSIS — K7030 Alcoholic cirrhosis of liver without ascites: Secondary | ICD-10-CM | POA: Diagnosis present

## 2016-09-04 DIAGNOSIS — Z9049 Acquired absence of other specified parts of digestive tract: Secondary | ICD-10-CM

## 2016-09-04 DIAGNOSIS — D6959 Other secondary thrombocytopenia: Secondary | ICD-10-CM | POA: Diagnosis present

## 2016-09-04 DIAGNOSIS — E119 Type 2 diabetes mellitus without complications: Secondary | ICD-10-CM | POA: Diagnosis present

## 2016-09-04 DIAGNOSIS — Z8249 Family history of ischemic heart disease and other diseases of the circulatory system: Secondary | ICD-10-CM

## 2016-09-04 DIAGNOSIS — K1379 Other lesions of oral mucosa: Secondary | ICD-10-CM | POA: Diagnosis present

## 2016-09-04 DIAGNOSIS — J690 Pneumonitis due to inhalation of food and vomit: Principal | ICD-10-CM | POA: Diagnosis present

## 2016-09-04 DIAGNOSIS — K766 Portal hypertension: Secondary | ICD-10-CM | POA: Diagnosis present

## 2016-09-04 DIAGNOSIS — Z95828 Presence of other vascular implants and grafts: Secondary | ICD-10-CM

## 2016-09-04 DIAGNOSIS — R0902 Hypoxemia: Secondary | ICD-10-CM | POA: Diagnosis present

## 2016-09-04 DIAGNOSIS — K920 Hematemesis: Secondary | ICD-10-CM

## 2016-09-04 DIAGNOSIS — R45851 Suicidal ideations: Secondary | ICD-10-CM | POA: Diagnosis present

## 2016-09-04 DIAGNOSIS — R140 Abdominal distension (gaseous): Secondary | ICD-10-CM

## 2016-09-04 DIAGNOSIS — F17210 Nicotine dependence, cigarettes, uncomplicated: Secondary | ICD-10-CM | POA: Diagnosis present

## 2016-09-04 DIAGNOSIS — F329 Major depressive disorder, single episode, unspecified: Secondary | ICD-10-CM | POA: Diagnosis present

## 2016-09-04 DIAGNOSIS — R0600 Dyspnea, unspecified: Secondary | ICD-10-CM

## 2016-09-04 DIAGNOSIS — I8510 Secondary esophageal varices without bleeding: Secondary | ICD-10-CM | POA: Diagnosis present

## 2016-09-04 DIAGNOSIS — I5032 Chronic diastolic (congestive) heart failure: Secondary | ICD-10-CM | POA: Diagnosis present

## 2016-09-04 DIAGNOSIS — F10929 Alcohol use, unspecified with intoxication, unspecified: Secondary | ICD-10-CM

## 2016-09-04 DIAGNOSIS — K219 Gastro-esophageal reflux disease without esophagitis: Secondary | ICD-10-CM | POA: Diagnosis present

## 2016-09-04 DIAGNOSIS — J189 Pneumonia, unspecified organism: Secondary | ICD-10-CM

## 2016-09-04 LAB — CBC
HCT: 47.7 % (ref 39.0–52.0)
HEMOGLOBIN: 16 g/dL (ref 13.0–17.0)
MCH: 34.3 pg — ABNORMAL HIGH (ref 26.0–34.0)
MCHC: 33.5 g/dL (ref 30.0–36.0)
MCV: 102.4 fL — ABNORMAL HIGH (ref 78.0–100.0)
Platelets: 40 10*3/uL — ABNORMAL LOW (ref 150–400)
RBC: 4.66 MIL/uL (ref 4.22–5.81)
RDW: 14.2 % (ref 11.5–15.5)
WBC: 3.9 10*3/uL — AB (ref 4.0–10.5)

## 2016-09-04 LAB — HEPATIC FUNCTION PANEL
ALT: 86 U/L — ABNORMAL HIGH (ref 17–63)
AST: 130 U/L — ABNORMAL HIGH (ref 15–41)
Albumin: 3.3 g/dL — ABNORMAL LOW (ref 3.5–5.0)
Alkaline Phosphatase: 80 U/L (ref 38–126)
BILIRUBIN DIRECT: 0.7 mg/dL — AB (ref 0.1–0.5)
BILIRUBIN TOTAL: 1.9 mg/dL — AB (ref 0.3–1.2)
Indirect Bilirubin: 1.2 mg/dL — ABNORMAL HIGH (ref 0.3–0.9)
Total Protein: 7.2 g/dL (ref 6.5–8.1)

## 2016-09-04 LAB — TROPONIN I: Troponin I: <0.03 ng/mL (ref <0.03)

## 2016-09-04 LAB — BASIC METABOLIC PANEL
ANION GAP: 10 (ref 5–15)
BUN: <5 mg/dL — ABNORMAL LOW (ref 6–20)
CALCIUM: 8.7 mg/dL — AB (ref 8.9–10.3)
CO2: 24 mmol/L (ref 22–32)
Chloride: 108 mmol/L (ref 101–111)
Creatinine, Ser: 0.5 mg/dL — ABNORMAL LOW (ref 0.61–1.24)
GFR calc Af Amer: >60 mL/min (ref >60)
GLUCOSE: 247 mg/dL — AB (ref 65–99)
Potassium: 3.6 mmol/L (ref 3.5–5.1)
SODIUM: 142 mmol/L (ref 135–145)

## 2016-09-04 LAB — ETHANOL: ALCOHOL ETHYL (B): 436 mg/dL — AB (ref <5)

## 2016-09-04 LAB — D-DIMER, QUANTITATIVE: D-Dimer, Quant: 0.59 ug/mL-FEU — ABNORMAL HIGH (ref 0.00–0.50)

## 2016-09-04 LAB — TYPE AND SCREEN
ABO/RH(D): O POS
ANTIBODY SCREEN: NEGATIVE

## 2016-09-04 LAB — GLUCOSE, CAPILLARY
Glucose-Capillary: 205 mg/dL — ABNORMAL HIGH (ref 65–99)
Glucose-Capillary: 261 mg/dL — ABNORMAL HIGH (ref 65–99)

## 2016-09-04 LAB — I-STAT TROPONIN, ED: TROPONIN I, POC: 0 ng/mL (ref 0.00–0.08)

## 2016-09-04 LAB — LIPASE, BLOOD: LIPASE: 56 U/L — AB (ref 11–51)

## 2016-09-04 MED ORDER — ONDANSETRON HCL 4 MG/2ML IJ SOLN
4.0000 mg | Freq: Four times a day (QID) | INTRAMUSCULAR | Status: DC | PRN
  Administered 2016-09-05: 4 mg via INTRAVENOUS
  Filled 2016-09-04: qty 2

## 2016-09-04 MED ORDER — ONDANSETRON HCL 4 MG/2ML IJ SOLN
4.0000 mg | Freq: Once | INTRAMUSCULAR | Status: AC
  Administered 2016-09-04: 4 mg via INTRAVENOUS
  Filled 2016-09-04: qty 2

## 2016-09-04 MED ORDER — DEXTROSE 5 % IV SOLN
1.0000 g | Freq: Once | INTRAVENOUS | Status: AC
  Administered 2016-09-04: 1 g via INTRAVENOUS
  Filled 2016-09-04: qty 10

## 2016-09-04 MED ORDER — SODIUM CHLORIDE 0.9 % IV SOLN
3.0000 g | Freq: Four times a day (QID) | INTRAVENOUS | Status: DC
  Filled 2016-09-04 (×2): qty 3

## 2016-09-04 MED ORDER — METRONIDAZOLE IN NACL 5-0.79 MG/ML-% IV SOLN
500.0000 mg | Freq: Once | INTRAVENOUS | Status: AC
  Administered 2016-09-04: 500 mg via INTRAVENOUS
  Filled 2016-09-04: qty 100

## 2016-09-04 MED ORDER — THIAMINE HCL 100 MG/ML IJ SOLN
100.0000 mg | Freq: Every day | INTRAMUSCULAR | Status: DC
  Filled 2016-09-04: qty 2

## 2016-09-04 MED ORDER — OCTREOTIDE LOAD VIA INFUSION
50.0000 ug | Freq: Once | INTRAVENOUS | Status: AC
  Administered 2016-09-04: 50 ug via INTRAVENOUS
  Filled 2016-09-04: qty 25

## 2016-09-04 MED ORDER — LORAZEPAM 2 MG/ML IJ SOLN
1.0000 mg | Freq: Four times a day (QID) | INTRAMUSCULAR | Status: DC | PRN
Start: 2016-09-04 — End: 2016-09-06
  Administered 2016-09-05: 1 mg via INTRAVENOUS
  Filled 2016-09-04: qty 1

## 2016-09-04 MED ORDER — CARVEDILOL 3.125 MG PO TABS
3.1250 mg | ORAL_TABLET | Freq: Two times a day (BID) | ORAL | Status: DC
  Administered 2016-09-04 – 2016-09-06 (×4): 3.125 mg via ORAL
  Filled 2016-09-04 (×4): qty 1

## 2016-09-04 MED ORDER — SODIUM CHLORIDE 0.9 % IV SOLN
50.0000 ug/h | INTRAVENOUS | Status: DC
  Administered 2016-09-04: 50 ug/h via INTRAVENOUS
  Filled 2016-09-04 (×2): qty 1

## 2016-09-04 MED ORDER — FENTANYL CITRATE (PF) 100 MCG/2ML IJ SOLN
50.0000 ug | Freq: Once | INTRAMUSCULAR | Status: AC
  Administered 2016-09-04: 50 ug via INTRAVENOUS
  Filled 2016-09-04: qty 2

## 2016-09-04 MED ORDER — FOLIC ACID 1 MG PO TABS
1.0000 mg | ORAL_TABLET | Freq: Every day | ORAL | Status: DC
  Administered 2016-09-04 – 2016-09-06 (×3): 1 mg via ORAL
  Filled 2016-09-04 (×4): qty 1

## 2016-09-04 MED ORDER — MAGNESIUM OXIDE -MG SUPPLEMENT 400 (240 MG) MG PO TABS
1.0000 | ORAL_TABLET | Freq: Two times a day (BID) | ORAL | Status: DC
  Administered 2016-09-04 – 2016-09-06 (×4): 400 mg via ORAL
  Filled 2016-09-04 (×6): qty 1

## 2016-09-04 MED ORDER — SODIUM CHLORIDE 0.9 % IV BOLUS (SEPSIS)
1000.0000 mL | Freq: Once | INTRAVENOUS | Status: AC
  Administered 2016-09-04: 1000 mL via INTRAVENOUS

## 2016-09-04 MED ORDER — PANTOPRAZOLE SODIUM 40 MG IV SOLR
40.0000 mg | Freq: Two times a day (BID) | INTRAVENOUS | Status: DC
  Administered 2016-09-04 – 2016-09-06 (×5): 40 mg via INTRAVENOUS
  Filled 2016-09-04 (×6): qty 40

## 2016-09-04 MED ORDER — PANTOPRAZOLE SODIUM 40 MG IV SOLR
40.0000 mg | Freq: Once | INTRAVENOUS | Status: AC
  Administered 2016-09-04: 40 mg via INTRAVENOUS
  Filled 2016-09-04: qty 40

## 2016-09-04 MED ORDER — ADULT MULTIVITAMIN W/MINERALS CH
1.0000 | ORAL_TABLET | Freq: Every day | ORAL | Status: DC
  Administered 2016-09-04 – 2016-09-06 (×3): 1 via ORAL
  Filled 2016-09-04 (×3): qty 1

## 2016-09-04 MED ORDER — INSULIN ASPART 100 UNIT/ML ~~LOC~~ SOLN
0.0000 [IU] | Freq: Three times a day (TID) | SUBCUTANEOUS | Status: DC
  Administered 2016-09-04: 3 [IU] via SUBCUTANEOUS

## 2016-09-04 MED ORDER — LORAZEPAM 1 MG PO TABS
1.0000 mg | ORAL_TABLET | Freq: Four times a day (QID) | ORAL | Status: DC | PRN
Start: 2016-09-04 — End: 2016-09-06
  Administered 2016-09-04 – 2016-09-06 (×2): 1 mg via ORAL
  Filled 2016-09-04 (×2): qty 1

## 2016-09-04 MED ORDER — VITAMIN B-1 100 MG PO TABS
100.0000 mg | ORAL_TABLET | Freq: Every day | ORAL | Status: DC
  Administered 2016-09-04 – 2016-09-06 (×3): 100 mg via ORAL
  Filled 2016-09-04 (×3): qty 1

## 2016-09-04 MED ORDER — ONDANSETRON HCL 4 MG PO TABS: 4.0000 mg | ORAL_TABLET | Freq: Four times a day (QID) | ORAL | Status: DC | PRN

## 2016-09-04 NOTE — Progress Notes (Signed)
[name] [name] is a 51 y.o. male patient admitted from ED awake, alert - oriented  X 4 - no acute distress noted.  VSS - Blood pressure 114/69, pulse 95, temperature 98.2 F (36.8 C), temperature source Oral, resp. rate 20, height 5\' 11"  (1.803 m), weight 101.2 kg (223 lb 3.2 oz), SpO2 92 %.    IV in place, occlusive dsg intact without redness.  Orientation to room, and floor completed with information packet given to patient/family.  Patient declined safety video at this time.  Admission INP armband ID verified with patient/family, and in place.   SR up x 2, fall assessment complete, with patient and family able to verbalize understanding of risk associated with falls, and verbalized understanding to call nsg before up out of bed.  Call light within reach, patient able to voice, and demonstrate understanding.  Skin, clean-dry- intact without evidence of bruising, or skin tears. Heels and nails are very dry bilaterally. No evidence of skin break down noted on exam.     Will cont to eval and treat per MD orders.  Melvenia NeedlesIreti O Mariateresa Batra, RN 09/04/2016 3:44 PM

## 2016-09-04 NOTE — Progress Notes (Signed)
Pharmacy Antibiotic Note  [name] BearDavid [name] is a 51 y.o. male admitted on 09/04/2016 with vomiting and concern for aspiration PNA in the setting of EtOH abuse.  Pharmacy has been consulted for Unasyn dosing. SCr 0.5, CrCl>100 ml/min.   Plan: 1. Start Unasyn 3g IV every 6 hours 2. Will continue to follow renal function, culture results, LOT, and antibiotic de-escalation plans   Height: 5\' 11"  (180.3 cm) Weight: 223 lb 3.2 oz (101.2 kg) IBW/kg (Calculated) : 75.3  Temp (24hrs), Avg:98.2 F (36.8 C), Min:98.2 F (36.8 C), Max:98.2 F (36.8 C)   Recent Labs Lab 09/04/16 0913  WBC 3.9*  CREATININE 0.50*    Estimated Creatinine Clearance: 130.9 mL/min (A) (by C-G formula based on SCr of 0.5 mg/dL (L)).    No Known Allergies  Antimicrobials this admission: Unasyn 6/30 >>  Dose adjustments this admission: n/a  Microbiology results: n/a  Thank you for allowing pharmacy to be a part of this patient's care.  Georgina PillionElizabeth Allis Quirarte, PharmD, BCPS Clinical Pharmacist Clinical phone for 09/04/2016 from 7a-3:30p: [phone] If after 3:30p, please call main pharmacy at: [phone] 09/04/2016 3:48 PM

## 2016-09-04 NOTE — Progress Notes (Addendum)
Report received from the ED. Pt arrived to the unit via bed at 1530.

## 2016-09-04 NOTE — H&P (Signed)
Date: 09/04/2016               Patient Name:  [name] [name] MRN: [id number]  DOB: [birth year] Age / Sex: 51 y.o., male   PCP: Quentin Angst, MD         Medical Service: Internal Medicine Teaching Service         Attending Physician: Dr. Doneen Poisson, MD    First Contact: Dr. Crista Elliot Pager: [phone]   Second Contact: Dr. Loney Loh  Pager: [phone]       After Hours (After 5p/  First Contact Pager: [phone]  weekends / holidays): Second Contact Pager: [phone]   Chief Complaint: vomiting blood   History of Present Illness: 51 yo man with PMH alcoholic cirrhosis complicated by portal HTN and esophageal varices s/p esophogeal banding and transjugular intrahepatic portosystemic shunt in 2013, with history of alcohol withdrawal and seizures, diastolic CHF, type 2 diabetes mellitus, and secondary thrombocytopenia presents after 2 episodes of vomiting bright red blood. He has had episodes like this in the past. He also reports a chest pain which has been ongoing for the last year the pain is over the right and left side of his chest he comes and goes and feels like a sharp poking pain. The pain does not change with exertion, deep breathing, or eating. The pain is relieved when he massages over these areas of his chest but the relief does not last long. He denies any history of MI or stroke. He denies recent travel. He follows with community health and wellness but and request help with getting  insurance this admission  In the ED he was afebrile with a heart rate 80s to 90s. His blood pressure was 155/93 and he was satting at 96% on room air. Labs revealed a hemoglobin 16, blood alcohol level 436, BUN <5 T bili 1.9, AST 130, ALT 86. Chest x-ray showed a possible left lower lobe pneumonia and he received a dose of ceftriaxone and Flagyl for presumed aspiration pneumonia. GI evaluated in in the ED and he declined EGD at that time however he was started on octreotide drip and IV Protonix  twice a day.White blood cells 3.9 (this is at baseline 2-5), hemoglobin 16, MCV 103, platelets 40 (this is at baseline). Point care troponin was 0.0, glucose of 250, lipase 56.   Meds: Reports that he does not take any of these medications at home.  Current Meds  Medication Sig  . carvedilol (COREG) 3.125 MG tablet Take 1 tablet (3.125 mg total) by mouth 2 (two) times daily with a meal.  . folic acid (FOLVITE) 1 MG tablet Take 1 tablet (1 mg total) by mouth daily.  . Magnesium Oxide 400 (240 Mg) MG TABS Take 1 tablet by mouth 2 (two) times daily.  . metFORMIN (GLUCOPHAGE) 1000 MG tablet Take 1 tablet (1,000 mg total) by mouth 2 (two) times daily.  . pantoprazole (PROTONIX) 40 MG tablet Take 1 tablet (40 mg total) by mouth daily.  Marland Kitchen thiamine 100 MG tablet Take 1 tablet (100 mg total) by mouth daily.   Allergies: Allergies as of 09/04/2016  . (No Known Allergies)   Past Medical History:  Diagnosis Date  . Alcohol abuse   . Cirrhosis (HCC)   . Diabetes mellitus without complication (HCC)   . GERD (gastroesophageal reflux disease)   . H/O hiatal hernia   . Headache(784.0)   . No pertinent past medical history   . Seizures (HCC)   . Shortness  of breath   . Sleep apnea     Family History: No family history of myocardial infarctions.  Social History: He smokes one cigarette every now and then reports that he used to smoke before but is not forthcoming with how much or for how long. He says that he drinks a 40 ounce of beer about twice a week and that yesterday evening was his last 40 ounce beer. He denies illicit drug use. He lives with his brother and sister-in-law.  Review of Systems: A complete ROS was negative except as per HPI.   Physical Exam: Blood pressure (!) 161/95, pulse 83, temperature 98.2 F (36.8 C), temperature source Oral, resp. rate 12, height 5\' 10"  (1.778 m), weight 215 lb (97.5 kg), SpO2 93 %. Physical Exam  Constitutional:  Appears acutely intoxicated and  older than stated age  HENT:  Head: Normocephalic and atraumatic.  Mouth/Throat: Oropharynx is clear and moist. No oropharyngeal exudate.  Eyes: Conjunctivae and EOM are normal. Pupils are equal, round, and reactive to light. Right eye exhibits no discharge. Left eye exhibits no discharge. No scleral icterus.  Cardiovascular: Normal rate and regular rhythm.   No murmur heard. Trace lower extremity pitting edema, no calf tenderness, swelling, or erythema  Pulmonary/Chest: Effort normal. No respiratory distress. He has no wheezes. He exhibits no tenderness.  Coarse crackles appreciated over left lung base Finger clubbing   Abdominal: Soft. Bowel sounds are normal. He exhibits no distension. There is tenderness. There is no guarding.  Exquisite epigastric tenderness to palpation  Neurological: He is alert.  Skin: Skin is warm and dry.   EKG: personally reviewed my interpretation is normal sinus rhythm, normal axis, remote T-wave inversion in V1 which is new from prior in February 2018  CXR: personally reviewed my interpretation is some right basilar atelectasis, left basilar opacity  Assessment & Plan by Problem: Active Problems:   Esophageal varices in alcoholic cirrhosis (HCC)   Alcoholic cirrhosis of liver without ascites (HCC)   Alcohol intoxication (HCC)   Hematemesis  Esophageal varices and alcoholic cirrhosis Hematemesis 51 year old man with past medical history of alcoholic cirrhosis complicated by portal HTN and esophageal varices, with history of alcohol withdrawal and seizures, and diastolic heart failure who presents after 2 episodes of vomiting bright red blood. He said that these 2 episodes of vomit were not preceded by other retching or vomiting. GI was consultation in the ED and the patient declined EGD. In 2013 he had esophageal banding and TIPS. He was noted to be hypoxemic during our examination in the ED ( SpO2 94% on 2 liters). Wells score for PE is 0. Ordered IV  Protonix twice a day Follow up morning CBC, will need serial H&H if he has further hematemesis  Pneumonia Chest x-ray showed a left lower lobe infiltrate. On exam he is afebrile and not coughing but has crackles over left lower lobe. He received a dose of ceftriaxone and Flagyl in the ED for suspected aspiration pneumonia. I do not doubt that he could have had an aspiration event, this would be an atypical location for aspired fluid. We will hold off on further antibiotics and consider ordering chest x-ray tomorrow morning for comparison.  Alcohol intoxication He appears acutely intoxicated in the ED. He reports drinking 40 ounces of beer twice per week and that his last drink was the evening of 6/29. He does report a history of alcohol withdrawal and seizures. Need to avoid Librium in this patient with known cirrhosis.  -CIWA  monitoring with ativan as needed   Suicidal ideations Upon arrival to his room his nurse reports that Mr. Duross asked her for an extensive that he wanted to kill himself. -Ordered sitter -Suicide safety precautions  Uninsured patient Consult to social work  Dispo: Admit patient to Inpatient with expected length of stay greater than 2 midnights.  Signed: Eulah Pont, MD 09/04/2016, 1:51 PM  Pager: [phone]

## 2016-09-04 NOTE — Consult Note (Signed)
Referring Provider: [facility]   Primary Care Physician:  Quentin AngstJegede, Olugbemiga E, MD Primary Gastroenterologist:  unassigned  Reason for Consultation:  hematemesis  ASSESSMENT AND PLAN:   History obtained through Spanish Interpreter Zaida # [phone]   1. 51 yo non-English speaking Hispanic male with ETOH cirrhosis complicated by portal HTN and. He continues to imbibe. He is s/p variceal [facility] and [facility]. He has been cared for at different surrounding Hospitals. No outpatient follow.   2. Hematemesis. Two episodes yesterday, sounds low volume with normal BUN, normal Hgb and no melena. Doubt variceal bleed given low volume and he is s/p TIPS (don't suspect that it is occluded).  -spoke with patient via interpreter about EGD. He refuses an EGD, recalls having two of them before. Believes the EGD hurt, I tried to explain that he would be sedated. Still no interest. -Add PPI BID.  -monitor CBC. -d/c octreotide -consider check TIPS patency at some point.   3. Chronic thrombocytopenia likely myelosuppression from ETOH and cirrhosis  4. Grade 1 diastolic dysfunction. EF 55-60%  5. CAP, on Rocephin    [facility] Attending   I have taken an interval history, reviewed the chart and examined the patient. I agree with the Advanced Practitioner's note, impression and recommendations.    Iva Booparl E. Gessner, MD, [facility] [phone] (pager) 09/04/2016 1:56 PM   HPI: [name] [name] is a 51 y.o. male in ED with complaints of hematemesis and chest pain. . Hemodynamically stable, hgb 16. Normal BUN. No blood in stools or black stools. He vomiting bright red blood twice yesterday. He has LUQ pain which started with the vomiting. Pain radiates through to his back. He also complains of non-exertional chest pain but this is not a new. He drank 40 oz of beer yesterday. Denies NSAID use. Lipase 56. Transaminases elevated in pattern of ETOH. Tbili 1.9.     Past Medical History:  Diagnosis Date  . Alcohol abuse   . Cirrhosis (HCC)   . Diabetes mellitus without complication (HCC)   . GERD (gastroesophageal reflux disease)   . H/O hiatal hernia   . Headache(784.0)   . No pertinent past medical history   . Seizures (HCC)   . Shortness of breath   . Sleep apnea     Past Surgical History:  Procedure Laterality Date  . APPENDECTOMY    . NO PAST SURGERIES    . RADIOLOGY WITH ANESTHESIA  01/30/2012   Procedure: RADIOLOGY WITH ANESTHESIA;  Surgeon: Casimiro NeedleMichael T. Miles CostainShick, MD;  Location: [facility] OR;  Service: Radiology;  Laterality: N/A;    Prior to Admission medications   Medication Sig Start Date End Date Taking? Authorizing Provider  carvedilol (COREG) 3.125 MG tablet Take 1 tablet (3.125 mg total) by mouth 2 (two) times daily with a meal. 04/21/16  Yes Jegede, Olugbemiga E, MD  folic acid (FOLVITE) 1 MG tablet Take 1 tablet (1 mg total) by mouth daily. 04/21/16  Yes Quentin AngstJegede, Olugbemiga E, MD  Magnesium Oxide 400 (240 Mg) MG TABS Take 1 tablet by mouth 2 (two) times daily. 12/31/15  Yes Richarda OverlieAbrol, Nayana, MD  metFORMIN (GLUCOPHAGE) 1000 MG tablet Take 1 tablet (1,000 mg total) by mouth 2 (two) times daily. 04/21/16  Yes Quentin AngstJegede, Olugbemiga E, MD  pantoprazole (PROTONIX) 40 MG tablet Take 1 tablet (40 mg total) by mouth daily. 04/21/16  Yes Quentin AngstJegede, Olugbemiga E, MD  thiamine 100 MG tablet Take 1 tablet (100 mg total) by mouth daily. 04/21/16  Yes Jegede, Olugbemiga E, MD  lactulose (CHRONULAC) 10 GM/15ML solution Take 45 mLs (30 g total) by mouth 3 (three) times daily. 04/21/16   Quentin Angst, MD    Current Facility-Administered Medications  Medication Dose Route Frequency Provider Last Rate Last Dose  . cefTRIAXone (ROCEPHIN) 1 g in dextrose 5 % 50 mL IVPB  1 g Intravenous Once Little, Ambrose Finland, MD 100 mL/hr at 09/04/16 1137 1 g at 09/04/16 1137  . metroNIDAZOLE (FLAGYL) IVPB 500 mg  500 mg Intravenous Once Little, Ambrose Finland, MD 100 mL/hr  at 09/04/16 1140 500 mg at 09/04/16 1140  . octreotide (SANDOSTATIN) 2 mcg/mL load via infusion 50 mcg  50 mcg Intravenous Once Little, Ambrose Finland, MD       And  . octreotide (SANDOSTATIN) 500 mcg in sodium chloride 0.9 % 250 mL (2 mcg/mL) infusion  50 mcg/hr Intravenous Continuous Little, Ambrose Finland, MD       Current Outpatient Prescriptions  Medication Sig Dispense Refill  . carvedilol (COREG) 3.125 MG tablet Take 1 tablet (3.125 mg total) by mouth 2 (two) times daily with a meal. 60 tablet 3  . folic acid (FOLVITE) 1 MG tablet Take 1 tablet (1 mg total) by mouth daily. 90 tablet 3  . Magnesium Oxide 400 (240 Mg) MG TABS Take 1 tablet by mouth 2 (two) times daily. 60 tablet 1  . metFORMIN (GLUCOPHAGE) 1000 MG tablet Take 1 tablet (1,000 mg total) by mouth 2 (two) times daily. 180 tablet 3  . pantoprazole (PROTONIX) 40 MG tablet Take 1 tablet (40 mg total) by mouth daily. 90 tablet 3  . thiamine 100 MG tablet Take 1 tablet (100 mg total) by mouth daily. 90 tablet 3  . lactulose (CHRONULAC) 10 GM/15ML solution Take 45 mLs (30 g total) by mouth 3 (three) times daily. 480 mL 3    Allergies as of 09/04/2016  . (No Known Allergies)    Family History  Problem Relation Age of Onset  . Hypertension Mother   . Syncope episode Sister     Social History   Social History  . Marital status: Single    Spouse name: N/A  . Number of children: N/A  . Years of education: N/A   Occupational History  . umemployed    Social History Main Topics  . Smoking status: Former Smoker    Packs/day: 0.25    Years: 10.00    Types: Cigarettes    Quit date: 09/30/2007  . Smokeless tobacco: Never Used  . Alcohol use Yes     Comment: 10/01/2014  "  i USUALLY DRINK A 12 PK ON THE WEEKENDS "  . Drug use: No  . Sexual activity: Not Currently   Other Topics Concern  . Not on file   Social History Narrative   Lives alone.    Review of Systems: All systems reviewed and negative except where  noted in HPI.  Physical Exam: Vital signs in last 24 hours: Temp:  [98.2 F (36.8 C)] 98.2 F (36.8 C) (06/30 0916) Pulse Rate:  [83-85] 83 (06/30 1045) Resp:  [12-18] 12 (06/30 1045) BP: (155-161)/(93-95) 161/95 (06/30 1045) SpO2:  [93 %-96 %] 93 % (06/30 1045) Weight:  [215 lb (97.5 kg)] 215 lb (97.5 kg) (06/30 0912)   General:   Alert, well-developed, Hispanic male in NAD Psych:  Pleasant, cooperative. Normal mood and affect. Eyes:  Pupils equal, sclera clear, no icterus.   Conjunctiva pink. Ears:  Normal auditory acuity. Nose:  No deformity, discharge,  or lesions. Neck:  Supple; no masses Lungs:  Clear throughout to auscultation.   No wheezes, crackles, or rhonchi.  Heart:  Regular rate and rhythm; no murmurs, no edema Abdomen:  Soft, non-distended, nontender, BS active, no palp mass    Rectal:  Deferred  Msk:  Symmetrical without gross deformities. . Pulses:  Normal pulses noted. Neurologic:  Alert and  oriented x4;  grossly normal neurologically. Skin:  Intact without significant lesions or rashes..    Lab Results:  Recent Labs  09/04/16 0913  WBC 3.9*  HGB 16.0  HCT 47.7  PLT 40*   BMET  Recent Labs  09/04/16 0913  NA 142  K 3.6  CL 108  CO2 24  GLUCOSE 247*  BUN <5*  CREATININE 0.50*  CALCIUM 8.7*   LFT  Recent Labs  09/04/16 0931  PROT 7.2  ALBUMIN 3.3*  AST 130*  ALT 86*  ALKPHOS 80  BILITOT 1.9*  BILIDIR 0.7*  IBILI 1.2*    Studies/Results: Dg Chest 2 View  Result Date: 09/04/2016 CLINICAL DATA:  RIGHT lower abdominal pain and LEFT upper chest pain for 4 months, vomited blood today, diabetes mellitus, cirrhosis, former smoker EXAM: CHEST  2 VIEW COMPARISON:  08/04/2016 FINDINGS: Enlargement of cardiac silhouette with pulmonary vascular congestion. Mediastinal contours normal. Minimal RIGHT basilar atelectasis. LEFT basilar opacity question atelectasis versus infiltrate, aspiration not excluded. Upper lungs clear. No pleural effusion  or pneumothorax. Lateral view degraded by respiratory motion. Scattered endplate spur formation thoracic spine. IMPRESSION: Minimal RIGHT basilar atelectasis. LEFT basilar opacity could represent atelectasis, infiltrate or aspiration. Electronically Signed   By: Ulyses Southward M.D.   On: 09/04/2016 09:58    Willette Cluster, NP-C @  09/04/2016, 12:05 PM  Pager number [phone]

## 2016-09-04 NOTE — ED Triage Notes (Signed)
Pt presents to the ed for complaints of throwing up blood that started today. Pt is not actively vomiting in triage, alert and oriented. Upon further assessment patient is also having chest pain, ekg done in triage. Translator used for triage.

## 2016-09-04 NOTE — ED Notes (Signed)
Got patient into a gown on the monitor patient is gone to x ray

## 2016-09-04 NOTE — ED Provider Notes (Signed)
[facility]-EMERGENCY DEPT Provider Note   CSN: [id number] Arrival date & time: 09/04/16  0848     History   Chief Complaint Chief Complaint  Patient presents with  . Emesis    HPI [name] [name] is a 51 y.o. male.  51 year old male with extensive past medical history including alcohol abuse, cirrhosis, type 2 diabetes mellitus, CHF, seizures who presents with hematemesis. The patient states that he has had 3 episodes of vomiting bright red blood this morning. He reports associated generalized abdominal pain, headache, and chest pain which he describes as "my heart hurts." He reports feeling short of breath every day and reports cough and subjective fevers. He has had diarrhea but no blood in his stool. He continues to drink alcohol, states he has had a 40 of beer today. He does not take any medications.   The history is provided by the patient. The history is limited by a language barrier. A language interpreter was used.  Emesis      Past Medical History:  Diagnosis Date  . Alcohol abuse   . Cirrhosis (HCC)   . Diabetes mellitus without complication (HCC)   . GERD (gastroesophageal reflux disease)   . H/O hiatal hernia   . Headache(784.0)   . No pertinent past medical history   . Seizures (HCC)   . Shortness of breath   . Sleep apnea     Patient Active Problem List   Diagnosis Date Noted  . GERD without esophagitis 04/21/2016  . Blurry vision, bilateral 04/21/2016  . Encephalopathy, hepatic (HCC) 12/29/2015  . Alcoholic hepatitis without ascites   . Hematemesis 12/27/2015  . Fall   . Chest pain   . Shortness of breath   . Hypoxemia 09/30/2015  . Alcohol intoxication (HCC) 09/30/2015  . Hypoxia 09/30/2015  . Acute diastolic CHF (congestive heart failure) (HCC) 11/06/2014  . History of cirrhosis of liver   . Pulmonary vascular congestion 11/05/2014  . Volume overload 11/05/2014  . Diastolic dysfunction with acute on chronic heart failure (HCC) 11/05/2014  .  Abdominal pain, chronic, epigastric 11/05/2014  . Diabetes (HCC) 07/24/2013  . HTN (hypertension) 07/24/2013  . Left arm weakness 06/12/2013  . S/P cholecystectomy 06/12/2013  . Splenomegaly 06/12/2013  . Encephalopathy 06/11/2013  . Alcoholic cirrhosis of liver without ascites (HCC) 11/02/2012  . DM (diabetes mellitus) type 2, uncontrolled, with ketoacidosis (HCC) 11/02/2012  . Pain in joint, lower leg 11/02/2012  . Syncopal episodes 10/21/2012  . Abdominal pain 10/21/2012  . Alcohol abuse 01/29/2012  . Alcoholic cirrhosis (HCC) 01/29/2012  . Thrombocytopenia, secondary 01/29/2012  . DM type 2 (diabetes mellitus, type 2) (HCC) 01/29/2012  . Esophageal varices in alcoholic cirrhosis (HCC) 01/29/2012  . History of GI bleed 01/29/2012    Past Surgical History:  Procedure Laterality Date  . APPENDECTOMY    . NO PAST SURGERIES    . RADIOLOGY WITH ANESTHESIA  01/30/2012   Procedure: RADIOLOGY WITH ANESTHESIA;  Surgeon: Casimiro NeedleMichael T. Miles CostainShick, MD;  Location: [facility] OR;  Service: Radiology;  Laterality: N/A;       Home Medications    Prior to Admission medications   Medication Sig Start Date End Date Taking? Authorizing Provider  carvedilol (COREG) 3.125 MG tablet Take 1 tablet (3.125 mg total) by mouth 2 (two) times daily with a meal. 04/21/16  Yes Jegede, Olugbemiga E, MD  folic acid (FOLVITE) 1 MG tablet Take 1 tablet (1 mg total) by mouth daily. 04/21/16  Yes Quentin AngstJegede, Olugbemiga E, MD  Magnesium Oxide 400 (  240 Mg) MG TABS Take 1 tablet by mouth 2 (two) times daily. 12/31/15  Yes Richarda Overlie, MD  metFORMIN (GLUCOPHAGE) 1000 MG tablet Take 1 tablet (1,000 mg total) by mouth 2 (two) times daily. 04/21/16  Yes Quentin Angst, MD  pantoprazole (PROTONIX) 40 MG tablet Take 1 tablet (40 mg total) by mouth daily. 04/21/16  Yes Quentin Angst, MD  thiamine 100 MG tablet Take 1 tablet (100 mg total) by mouth daily. 04/21/16  Yes Quentin Angst, MD  lactulose (CHRONULAC) 10 GM/15ML  solution Take 45 mLs (30 g total) by mouth 3 (three) times daily. 04/21/16   Quentin Angst, MD    Family History Family History  Problem Relation Age of Onset  . Hypertension Mother   . Syncope episode Sister     Social History Social History  Substance Use Topics  . Smoking status: Former Smoker    Packs/day: 0.25    Years: 10.00    Types: Cigarettes    Quit date: 09/30/2007  . Smokeless tobacco: Never Used  . Alcohol use Yes     Comment: 10/01/2014  "  i USUALLY DRINK A 12 PK ON THE WEEKENDS "     Allergies   Patient has no known allergies.   Review of Systems Review of Systems  Gastrointestinal: Positive for vomiting.   All other systems reviewed and are negative except that which was mentioned in HPI   Physical Exam Updated Vital Signs BP (!) 161/95   Pulse 83   Temp 98.2 F (36.8 C) (Oral)   Resp 12   Ht 5\' 10"  (1.778 m)   Wt 97.5 kg (215 lb)   SpO2 93%   BMI 30.85 kg/m   Physical Exam  Constitutional: He is oriented to person, place, and time. He appears well-developed and well-nourished. No distress.  Chronically ill-appearing  HENT:  Head: Normocephalic and atraumatic.  Dried blood on beard  Eyes: Conjunctivae are normal. Pupils are equal, round, and reactive to light.  Neck: Neck supple.  Cardiovascular: Normal rate, regular rhythm and normal heart sounds.   No murmur heard. Pulmonary/Chest: Effort normal and breath sounds normal.  Abdominal: Soft. Bowel sounds are normal. He exhibits no distension. There is tenderness (generalized).  Musculoskeletal: He exhibits no edema.  Neurological: He is alert and oriented to person, place, and time.  Intoxicated but able to answer questions and follow commands  Skin: Skin is warm and dry.  Psychiatric:  Disheveled appearance, intoxicated  Nursing note and vitals reviewed.    ED Treatments / Results  Labs (all labs ordered are listed, but only abnormal results are displayed) Labs Reviewed    BASIC METABOLIC PANEL - Abnormal; Notable for the following:       Result Value   Glucose, Bld 247 (*)    BUN <5 (*)    Creatinine, Ser 0.50 (*)    Calcium 8.7 (*)    All other components within normal limits  CBC - Abnormal; Notable for the following:    WBC 3.9 (*)    MCV 102.4 (*)    MCH 34.3 (*)    Platelets 40 (*)    All other components within normal limits  LIPASE, BLOOD - Abnormal; Notable for the following:    Lipase 56 (*)    All other components within normal limits  HEPATIC FUNCTION PANEL - Abnormal; Notable for the following:    Albumin 3.3 (*)    AST 130 (*)    ALT 86 (*)  Total Bilirubin 1.9 (*)    Bilirubin, Direct 0.7 (*)    Indirect Bilirubin 1.2 (*)    All other components within normal limits  ETHANOL - Abnormal; Notable for the following:    Alcohol, Ethyl (B) 436 (*)    All other components within normal limits  I-STAT TROPOININ, ED  TYPE AND SCREEN    EKG  EKG Interpretation  Date/Time:  Saturday September 04 2016 09:20:37 EDT Ventricular Rate:  86 PR Interval:  200 QRS Duration: 110 QT Interval:  376 QTC Calculation: 449 R Axis:   -2 Text Interpretation:  Normal sinus rhythm Inferior infarct , age undetermined Abnormal ECG No significant change since last tracing Confirmed by Frederick Peers [phone]) on 09/04/2016 12:04:27 PM       Radiology Dg Chest 2 View  Result Date: 09/04/2016 CLINICAL DATA:  RIGHT lower abdominal pain and LEFT upper chest pain for 4 months, vomited blood today, diabetes mellitus, cirrhosis, former smoker EXAM: CHEST  2 VIEW COMPARISON:  08/04/2016 FINDINGS: Enlargement of cardiac silhouette with pulmonary vascular congestion. Mediastinal contours normal. Minimal RIGHT basilar atelectasis. LEFT basilar opacity question atelectasis versus infiltrate, aspiration not excluded. Upper lungs clear. No pleural effusion or pneumothorax. Lateral view degraded by respiratory motion. Scattered endplate spur formation thoracic spine.  IMPRESSION: Minimal RIGHT basilar atelectasis. LEFT basilar opacity could represent atelectasis, infiltrate or aspiration. Electronically Signed   By: Ulyses Southward M.D.   On: 09/04/2016 09:58    Procedures Procedures (including critical care time)  Medications Ordered in ED Medications  cefTRIAXone (ROCEPHIN) 1 g in dextrose 5 % 50 mL IVPB (1 g Intravenous New Bag/Given 09/04/16 1137)  metroNIDAZOLE (FLAGYL) IVPB 500 mg (500 mg Intravenous New Bag/Given 09/04/16 1140)  octreotide (SANDOSTATIN) 2 mcg/mL load via infusion 50 mcg (not administered)    And  octreotide (SANDOSTATIN) 500 mcg in sodium chloride 0.9 % 250 mL (2 mcg/mL) infusion (not administered)  sodium chloride 0.9 % bolus 1,000 mL (1,000 mLs Intravenous New Bag/Given 09/04/16 1141)  ondansetron (ZOFRAN) injection 4 mg (4 mg Intravenous Given 09/04/16 1136)  fentaNYL (SUBLIMAZE) injection 50 mcg (50 mcg Intravenous Given 09/04/16 1136)     Initial Impression / Assessment and Plan / ED Course  I have reviewed the triage vital signs and the nursing notes.  Pertinent labs & imaging results that were available during my care of the patient were reviewed by me and considered in my medical decision making (see chart for details).    PT w/ h/o cirrhosis p/w 3 episodes of hematemesis today. He had stable vital signs on arrival, afebrile with heart rate 83, BP 161/95. He did have mild hypoxia and was placed on 2 L nasal cannula. He had dried blood in his beard suggestive of upper GI bleed. Because of his cirrhosis, initiated octreotide drip and bolus, Protonix, as well as IV fluids, Zofran, and fentanyl. His alcohol level is 436, LFTs similar to previous, WBC 3.9, hemoglobin 16. His x-ray shows left basilar opacity, infiltrate or aspiration. Given his hypoxia, we'll cover for aspiration pneumonia with ceftriaxone and Flagyl; CTX also for variceal bleed. Discussed w/ GI, they will see patient in consultation. Discussed with internal medicine  teaching service and patient admitted for further care.  Final Clinical Impressions(s) / ED Diagnoses   Final diagnoses:  Hematemesis with nausea  Alcoholic cirrhosis, unspecified whether ascites present [facility])    New Prescriptions New Prescriptions   No medications on file     Little, Ambrose Finland, MD 09/04/16 1209

## 2016-09-04 NOTE — ED Notes (Signed)
Dr Clarene DukeLittle informed of pt ETOH level

## 2016-09-04 NOTE — Discharge Summary (Signed)
Name: [name] [name] MRN: [id number] DOB: [birth year] 51 y.o. PCP: [name] Angst, MD  Date of Admission: 09/04/2016  9:18 AM Date of Discharge: 09/07/2016 Attending Physician: [name] Poisson, MD  Discharge Diagnosis: Active Problems:   Esophageal varices in alcoholic cirrhosis (HCC)   Alcoholic cirrhosis of liver without ascites (HCC)   Alcohol intoxication (HCC)   Oral bleeding   Pneumonitis   Discharge Medications: Allergies as of 09/06/2016   No Known Allergies     Medication List    TAKE these medications   amoxicillin-clavulanate 875-125 MG tablet Commonly known as:  AUGMENTIN Take 1 tablet by mouth every 12 (twelve) hours.   carvedilol 3.125 MG tablet Commonly known as:  COREG Take 1 tablet (3.125 mg total) by mouth 2 (two) times daily with a meal.   FLUoxetine 20 MG capsule Commonly known as:  PROZAC Take 1 capsule (20 mg total) by mouth daily.   folic acid 1 MG tablet Commonly known as:  FOLVITE Take 1 tablet (1 mg total) by mouth daily.   lactulose 10 GM/15ML solution Commonly known as:  CHRONULAC Take 45 mLs (30 g total) by mouth 3 (three) times daily.   Magnesium Oxide 400 (240 Mg) MG Tabs Take 1 tablet by mouth 2 (two) times daily.   metFORMIN 1000 MG tablet Commonly known as:  GLUCOPHAGE Take 1 tablet (1,000 mg total) by mouth 2 (two) times daily.   multivitamin with minerals Tabs tablet Take 1 tablet by mouth daily.   pantoprazole 40 MG tablet Commonly known as:  PROTONIX Take 1 tablet (40 mg total) by mouth daily.   thiamine 100 MG tablet Take 1 tablet (100 mg total) by mouth daily.       Disposition and follow-up:   [name] [name] was discharged from [facility] in Stable condition.  At the hospital follow up visit please address:  1.  Hematemesis with history of cirrhosis and esophageal varices-please reinforce follow up with GI  Alcohol abuse- please reinforce abstinence encourage attendance to AA meetings  or provide any other resources available.  Please continue to monitor Platelet count.  2.  Labs / imaging needed at time of follow-up:   3.  Pending labs/ test needing follow-up:   Follow-up Appointments: Follow-up Information    [facility]. Schedule an appointment as soon as possible for a visit on 09/13/2016.   Why:  post hospital follow up scheduled for 09/13/2016 at 10:30 am with [name] Jun NP at the [facility] Contact information: [address] [place] [address] [phone]          Hospital Course by problem list: Active Problems:   Esophageal varices in alcoholic cirrhosis (HCC)   Alcoholic cirrhosis of liver without ascites (HCC)   Alcohol intoxication (HCC)   Oral bleeding   Pneumonitis    Hematemesis vs Oral Bleeding As the bleeding remitted slightly it was determined that the patient was had not been experiencing hematemesis but was instead experiencing an oral bleed. Bright red blood was observed originating from a molar in the left lower oral cavity with small clots forming around the site. After pressure was applied for several hours with gauze, the bleeding subsided. Due to the patients high risk for esophageal bleeding, his thrombocytopenia and decreasing hemoglobin, GI completed a workup for hematemesis to better determine the source of the bleed. In addition, the patient had experienced minor chest pain and coughing for at least two weeks prior increasing concern for  an abdominal source of the blood. The Patients troponin was negative for cardiac stress, EKG was unremarkable with regard to prior study. Patients pain was lateral and not directly associated with activity. Patient was discharged home with the advice to follow-up with his PCP and a dentist. He was also advised to follow-up with GI due to the severity of his cirrhosis and esophageal varices.   Alcoholic cirrhosis Ultrasound showed a patient TIPS, hepatic  function panel showed AST/ALT at patients baseline with total bili being lower than typical.   Esophageal varices 51 year old man with past medical history of alcoholic cirrhosis complicated by portal hypertension and esophageal varices status post esophageal banding in TIPS in 2013 with a history of alcohol withdrawal and seizures, diastolic CHF, type 2 diabetes mellitus and secondary thrombocytopenia presents after 2 episodes of vomiting bright red blood associated with chest pain. In the ED he had a heart rate of 80s to 90s, blood pressure 155/93, satting at 96% on room air. Labs revealed a hemoglobin of 16, blood alcohol level 430, BUN undetectable (less than 5). Trop neg x2 with no active acute ischemia on EKG. Chest x-ray showed a possible left lower lobe pneumonia and he got a dose of ceftriaxone and Flagyl for presumed aspiration. GI evaluated him in the ED and he declined EGD he was started on IV Protonix twice daily. He continued to spit blood up overnight. Hgb the following morning was 15.3 and 16.0 and 14.3 each day respectively throughout his stay.   Alcohol intoxication He appeared acutely intoxicated on presentation to the ED. CIWA scores were monitored and managed with ativan.   Pneumonitis: Follow-up chest x-ray revealed a resolution of the inifiltrate in the left lower lung lobe leading the team to believe that the patient had experienced a inflammatory reaction in that area most likely from inhalation of fluid, probably secondary to the large amount on blood the patient was expelling.    Discharge Vitals:   BP (!) 158/76 (BP Location: Right Arm)   Pulse 68   Temp 98.6 F (37 C) (Oral)   Resp 18   Ht 5\' 11"  (1.803 m)   Wt 223 lb 3.2 oz (101.2 kg)   SpO2 100%   BMI 31.13 kg/m   Pertinent Labs, Studies, and Procedures:  INR-1.52 PT 18.4(elevated) APTT 35 CBC Hemo 14.5 final Plt: 34.1   US Art/Vein Flow Abd and Abdomen complete IMPRESSION: 1. Patent TIPS.  Limited  study as above.  IMPRESSION: 1. Prior cholecystectomy.  No biliary distention.  2. Heterogeneously dense hepatic parenchymal pattern consistent patient's history of known cirrhosis. Liver density makes evaluation for focal lesions difficult. No focal hepatic abnormality identified. TIPS patent.  Discharge Instructions: Discharge Instructions    Call MD for:  difficulty breathing, headache or visual disturbances    Complete by:  As directed    Call MD for:  extreme fatigue    Complete by:  As directed    Call MD for:  persistant dizziness or light-headedness    Complete by:  As directed    Diet - low sodium heart healthy    Complete by:  As directed    Increase activity slowly    Complete by:  As directed       Signed: Lanelle BalHarbrecht, Jaquan Sadowsky, MD 09/07/2016, 4:12 PM   Pager: [phone]

## 2016-09-04 NOTE — Progress Notes (Addendum)
Pt uttered a statement to me while I was assessing him, he sates "can you give me a knife, I want to kill myself". Charge nurse has already been informed and MD notified.  I am in pt room at this time, keeping watch until alternate help arrives.

## 2016-09-05 ENCOUNTER — Inpatient Hospital Stay (HOSPITAL_COMMUNITY): Payer: Self-pay

## 2016-09-05 ENCOUNTER — Encounter (HOSPITAL_COMMUNITY): Payer: Self-pay | Admitting: Internal Medicine

## 2016-09-05 DIAGNOSIS — R45851 Suicidal ideations: Secondary | ICD-10-CM

## 2016-09-05 LAB — GLUCOSE, CAPILLARY
GLUCOSE-CAPILLARY: 211 mg/dL — AB (ref 65–99)
GLUCOSE-CAPILLARY: 165 mg/dL — AB (ref 65–99)
Glucose-Capillary: 179 mg/dL — ABNORMAL HIGH (ref 65–99)
Glucose-Capillary: 204 mg/dL — ABNORMAL HIGH (ref 65–99)

## 2016-09-05 LAB — CBC
HCT: 42.7 % (ref 39.0–52.0)
HEMOGLOBIN: 14.3 g/dL (ref 13.0–17.0)
MCH: 33.8 pg (ref 26.0–34.0)
MCHC: 33.5 g/dL (ref 30.0–36.0)
MCV: 100.9 fL — AB (ref 78.0–100.0)
PLATELETS: 34 10*3/uL — AB (ref 150–400)
RBC: 4.23 MIL/uL (ref 4.22–5.81)
RDW: 13.7 % (ref 11.5–15.5)
WBC: 3 10*3/uL — ABNORMAL LOW (ref 4.0–10.5)

## 2016-09-05 LAB — HIV ANTIBODY (ROUTINE TESTING W REFLEX): HIV Screen 4th Generation wRfx: NONREACTIVE

## 2016-09-05 LAB — PROTIME-INR
INR: 1.52
Prothrombin Time: 18.4 seconds — ABNORMAL HIGH (ref 11.4–15.2)

## 2016-09-05 LAB — APTT: APTT: 35 s (ref 24–36)

## 2016-09-05 MED ORDER — AMOXICILLIN-POT CLAVULANATE 875-125 MG PO TABS
1.0000 | ORAL_TABLET | Freq: Two times a day (BID) | ORAL | Status: DC
  Administered 2016-09-05 – 2016-09-06 (×3): 1 via ORAL
  Filled 2016-09-05 (×3): qty 1

## 2016-09-05 MED ORDER — ACETAMINOPHEN 325 MG PO TABS
650.0000 mg | ORAL_TABLET | Freq: Four times a day (QID) | ORAL | Status: DC | PRN
  Administered 2016-09-06: 650 mg via ORAL
  Filled 2016-09-05: qty 2

## 2016-09-05 MED ORDER — FLUOXETINE HCL 20 MG PO CAPS
20.0000 mg | ORAL_CAPSULE | Freq: Every day | ORAL | Status: DC
  Administered 2016-09-05 – 2016-09-06 (×2): 20 mg via ORAL
  Filled 2016-09-05 (×2): qty 1

## 2016-09-05 MED ORDER — INSULIN ASPART 100 UNIT/ML ~~LOC~~ SOLN
0.0000 [IU] | Freq: Three times a day (TID) | SUBCUTANEOUS | Status: DC
  Administered 2016-09-05: 3 [IU] via SUBCUTANEOUS
  Administered 2016-09-05 (×2): 5 [IU] via SUBCUTANEOUS
  Administered 2016-09-06: 3 [IU] via SUBCUTANEOUS
  Administered 2016-09-06: 2 [IU] via SUBCUTANEOUS

## 2016-09-05 NOTE — Progress Notes (Signed)
Internal Medicine Attending  Date: 09/05/2016  Patient name: [name] [name] Medical record number: [id number] Date of birth: [birth year] Age: 51 y.o. Gender: male  I saw and evaluated the patient. I reviewed the resident's note by Dr. Ennis FortsHarbrect and I agree with the resident's findings and plans as documented in his progress note with the following modifications: The male friend that was living with [name] [name] was reportedly killed recently and this has contributed to his depression. Also we did not observe nor suspect hematemesis or hemoptysis so it is questionable whether this is a pertinent positive in the review of systems.  Please see my H&P dated 09/05/2016 and attached to Dr. Tamela OddiBlum's H&P dated 09/04/2016 for the specifics of my evaluation, assessment, and plan from earlier today.

## 2016-09-05 NOTE — Progress Notes (Signed)
Pt threw up a large amount of blood. MD notified, charge nurse had also been informed.

## 2016-09-05 NOTE — Progress Notes (Signed)
PT Cancellation Note  Patient Details Name: [name] [name] MRN: [id number] DOB: Aug 31, 1965   Cancelled Treatment:    Reason Eval/Treat Not Completed: Medical issues which prohibited therapy. Per RN, pt is not medically ready as he is bleeding from the mouth and they aren't sure where it is coming from. Will check back once pt is more medically ready.    Colin BroachSabra M. Renne Cornick PT, DPT  [phone]  09/05/2016, 12:26 PM

## 2016-09-05 NOTE — Progress Notes (Signed)
PT Cancellation Note  Patient Details Name: [name] [name] MRN: [id number] DOB: 03/07/1966   Cancelled Treatment:    Reason Eval/Treat Not Completed: Patient at procedure or test/unavailable. Pt at x-ray. Will check back as time allows.    [name] [name] PT, DPT  [phone]  09/05/2016, 11:30 AM

## 2016-09-05 NOTE — Progress Notes (Signed)
° °  Subjective: Upon entering the room it was observed that Mr. [name] [name] was spitting bright red blood into a trash can beside his bed every few minutes. He stated that he had not done this overnight and only began a short while before we entered. He complained of chest pain lateral the the sternum over the heart and equivalent area on the right as well. There was no associate diaphoresis, shortness of breath as per the patient. An interpreter was used to communicate with the patient as he primarily speaks spanish. He was able to explain that he wanted to go home soon and that he lived with his brother and sister-in-law. He complained of a headache, the chest pain in the outer chest as described above and the spitting up of blood.   Objective:  Vital signs in last 24 hours: Vitals:   09/04/16 2058 09/04/16 2346 09/05/16 0547 09/05/16 0905  BP: (!) 115/59 137/85 (!) 150/82 (!) 143/83  Pulse: 85 82 76 71  Resp: 16 18 16 16   Temp: 98.6 F (37 C) 97.8 F (36.6 C) 97.9 F (36.6 C) 98.4 F (36.9 C)  TempSrc:  Oral  Oral  SpO2: 97% 100% 95% 99%  Weight:      Height:       Review of Systems  Constitutional: Negative for chills, diaphoresis and fever.  HENT: Negative for sore throat.   Respiratory: Positive for hemoptysis and sputum production. Negative for cough and shortness of breath.   Cardiovascular: Positive for chest pain. Negative for palpitations.  Gastrointestinal: Negative for abdominal pain, nausea and vomiting.  Neurological: Positive for weakness and headaches. Negative for dizziness and tingling.  Psychiatric/Behavioral: Positive for depression and substance abuse. Negative for suicidal ideas (Patient denied today and added that he was having a hard time since his mother passed, his male partner left, his children would not speak with him and he was without a job. ). The patient is nervous/anxious.    Physical Exam  Constitutional: He appears well-developed and well-nourished.  He appears distressed (Patient appeared to be discomfortable.).  HENT:  Head: Normocephalic.  Mouth/Throat: Oropharyngeal exudate (Bloody exudate originating from the teeth in the left lower jaw. Obvious and distict area of clotting mixed with active bleeding.) present.  Cardiovascular: Normal rate, regular rhythm and normal heart sounds.   Pulmonary/Chest: Effort normal and breath sounds normal.  Abdominal: Soft. There is tenderness. There is no rebound and no guarding.  Skin: Skin is warm and dry. He is not diaphoretic.    Assessment/Plan:  Active Problems:   Esophageal varices in alcoholic cirrhosis (HCC)   Alcoholic cirrhosis of liver without ascites (HCC)   Alcohol intoxication (HCC)   Hematemesis Esophageal varices in alcoholic cirrhosis: -As per GI/esophogeal source-most likely not the source of the patients bleeding but given history of TIPS, banding and alcoholic cirrhosis a US abd was ordered by GI to verify TIPS was patent-pending results  Alcoholic cirrhosis of liver w/o ascites: -Continue folic acid 1mg  daily and thiamine 100mg  daily tablet supplementation. Not continuing lactulose  Alcohol intoxication: -CIWA protocol- routine as needed Hematemesis:  -Bleeding appears to be coming from the tooth as mentioned above-gauze for compression was recommended, however since -Repeat Hemoglobin showed a decrease from 16.0 to 14.3 concerning for ongoing bleed of possible significance-will follow-up with repeat CBC and GI.    Dispo: Anticipated discharge in approximately 1 day.   Lanelle BalLawrence Harbrecht, MD Internal Medicine, PGY-1 Pager # [phone]

## 2016-09-05 NOTE — Progress Notes (Signed)
   Patient Name: Eulogio BearDavid Eisenstein Date of Encounter: 09/05/2016, 9:39 AM    Assessment and Plan  Hematemesis vs oral bleeding or both Alcoholic cirrhosis s/p TIPS for bleeding varices Mod severe thrombocytopenia  No INR yet on admit Alcohol intoxication at risk of w/d   Will check INR So far I think bleeding minor and not variceal but will monitor - stay on a PPI He declined EGD Abd US w/ TIPs patency check - I said tomorrow but will order for today    Iva Booparl E. Gessner, MD, [facility] [phone] (pager) 09/05/2016 9:39 AM   Subjective  Spitting up blood right now Vomited with red blood a few minutes ago Some abdominal pain Sig nausea Vomited 2x he says  Translation w/ google used   Objective  BP (!) 143/83 (BP Location: Right Arm)   Pulse 71   Temp 98.4 F (36.9 C) (Oral)   Resp 16   Ht 5\' 11"  (1.803 m)   Wt 223 lb 3.2 oz (101.2 kg)   SpO2 99%   BMI 31.13 kg/m  Chronically ill, disheveled anicteric Mouth - terrible dentition what remains - red blood oozing from left gums it seems Lungs clr ant Cor distant s1s2 abd protuberant and mildly tender Awake and cooperative  CBC Latest Ref Rng & Units 09/05/2016 09/04/2016 03/31/2016  WBC 4.0 - 10.5 K/uL 3.0(L) 3.9(L) 3.8(L)  Hemoglobin 13.0 - 17.0 g/dL 09.814.3 11.916.0 14.715.3  Hematocrit 39.0 - 52.0 % 42.7 47.7 42.4  Platelets 150 - 400 K/uL 34(L) 40(L) 48(L)

## 2016-09-06 ENCOUNTER — Inpatient Hospital Stay (HOSPITAL_COMMUNITY): Payer: Self-pay

## 2016-09-06 DIAGNOSIS — K1379 Other lesions of oral mucosa: Secondary | ICD-10-CM

## 2016-09-06 DIAGNOSIS — K0889 Other specified disorders of teeth and supporting structures: Secondary | ICD-10-CM

## 2016-09-06 DIAGNOSIS — F10229 Alcohol dependence with intoxication, unspecified: Secondary | ICD-10-CM

## 2016-09-06 DIAGNOSIS — J189 Pneumonia, unspecified organism: Secondary | ICD-10-CM | POA: Insufficient documentation

## 2016-09-06 DIAGNOSIS — J181 Lobar pneumonia, unspecified organism: Secondary | ICD-10-CM

## 2016-09-06 DIAGNOSIS — R58 Hemorrhage, not elsewhere classified: Secondary | ICD-10-CM

## 2016-09-06 DIAGNOSIS — K7010 Alcoholic hepatitis without ascites: Secondary | ICD-10-CM

## 2016-09-06 DIAGNOSIS — I8510 Secondary esophageal varices without bleeding: Secondary | ICD-10-CM

## 2016-09-06 LAB — GLUCOSE, CAPILLARY
GLUCOSE-CAPILLARY: 195 mg/dL — AB (ref 65–99)
Glucose-Capillary: 140 mg/dL — ABNORMAL HIGH (ref 65–99)

## 2016-09-06 LAB — CBC WITH DIFFERENTIAL/PLATELET
BASOS ABS: 0 10*3/uL (ref 0.0–0.1)
Basophils Relative: 1 %
EOS ABS: 0.1 10*3/uL (ref 0.0–0.7)
EOS PCT: 2 %
HCT: 42.6 % (ref 39.0–52.0)
Hemoglobin: 14.5 g/dL (ref 13.0–17.0)
LYMPHS PCT: 23 %
Lymphs Abs: 0.8 10*3/uL (ref 0.7–4.0)
MCH: 34.1 pg — ABNORMAL HIGH (ref 26.0–34.0)
MCHC: 34 g/dL (ref 30.0–36.0)
MCV: 100.2 fL — AB (ref 78.0–100.0)
MONO ABS: 0.5 10*3/uL (ref 0.1–1.0)
Monocytes Relative: 15 %
Neutro Abs: 2 10*3/uL (ref 1.7–7.7)
Neutrophils Relative %: 60 %
PLATELETS: 32 10*3/uL — AB (ref 150–400)
RBC: 4.25 MIL/uL (ref 4.22–5.81)
RDW: 13.6 % (ref 11.5–15.5)
WBC: 3.3 10*3/uL — AB (ref 4.0–10.5)

## 2016-09-06 MED ORDER — PANTOPRAZOLE SODIUM 40 MG PO TBEC
40.0000 mg | DELAYED_RELEASE_TABLET | Freq: Every day | ORAL | Status: DC
Start: 2016-09-06 — End: 2016-09-06
  Administered 2016-09-06: 40 mg via ORAL
  Filled 2016-09-06: qty 1

## 2016-09-06 MED ORDER — AMOXICILLIN-POT CLAVULANATE 875-125 MG PO TABS: 1.0000 | ORAL_TABLET | Freq: Two times a day (BID) | ORAL | 0 refills | Status: DC

## 2016-09-06 MED ORDER — THIAMINE HCL 100 MG PO TABS: 100.0000 mg | ORAL_TABLET | Freq: Every day | ORAL | 0 refills | Status: DC

## 2016-09-06 MED ORDER — FLUOXETINE HCL 20 MG PO CAPS: 20.0000 mg | ORAL_CAPSULE | Freq: Every day | ORAL | 0 refills | Status: AC

## 2016-09-06 MED ORDER — ADULT MULTIVITAMIN W/MINERALS CH: 1.0000 | ORAL_TABLET | Freq: Every day | ORAL | 1 refills | Status: AC

## 2016-09-06 MED ORDER — ADULT MULTIVITAMIN W/MINERALS CH: 1.0000 | ORAL_TABLET | Freq: Every day | ORAL | 1 refills | Status: DC

## 2016-09-06 MED ORDER — FLUOXETINE HCL 20 MG PO CAPS: 20.0000 mg | ORAL_CAPSULE | Freq: Every day | ORAL | 0 refills | Status: DC

## 2016-09-06 MED ORDER — THIAMINE HCL 100 MG PO TABS: 100.0000 mg | ORAL_TABLET | Freq: Every day | ORAL | 1 refills | Status: AC

## 2016-09-06 NOTE — Progress Notes (Signed)
[facility] Progress Note    Since last GI note: Resting comfortably.  He is not hungry.  No overt bleeding since admit.  Objective: Vital signs in last 24 hours: Temp:  [98.5 F (36.9 C)-99 F (37.2 C)] 98.6 F (37 C) (07/02 0601) Pulse Rate:  [64-75] 68 (07/02 0601) Resp:  [18-20] 18 (07/02 0601) BP: (145-165)/(60-86) 158/76 (07/02 0601) SpO2:  [94 %-100 %] 100 % (07/02 0900) Last BM Date: 09/06/16 General: alert and oriented times 3 Heart: regular rate and rythm Abdomen: soft, non-tender, non-distended, normal bowel sounds   Lab Results:  Recent Labs  09/04/16 0913 09/05/16 0613 09/06/16 0600  WBC 3.9* 3.0* 3.3*  HGB 16.0 14.3 14.5  PLT 40* 34* 32*  MCV 102.4* 100.9* 100.2*    Recent Labs  09/04/16 0913  NA 142  K 3.6  CL 108  CO2 24  GLUCOSE 247*  BUN <5*  CREATININE 0.50*  CALCIUM 8.7*    Recent Labs  09/04/16 0931  PROT 7.2  ALBUMIN 3.3*  AST 130*  ALT 86*  ALKPHOS 80  BILITOT 1.9*  BILIDIR 0.7*  IBILI 1.2*    Recent Labs  09/05/16 1227  INR 1.52     Studies/Results: Dg Chest 2 View  Result Date: 09/05/2016 CLINICAL DATA:  Hemoptysis, pneumonia EXAM: CHEST  2 VIEW COMPARISON:  09/04/2016 FINDINGS: Cardiomegaly. Patchy airspace disease in the right mid and lower lung. Left lung is clear. No effusions or acute bony abnormality. IMPRESSION: Patchy right mid and lower lung opacities, likely pneumonia. Cardiomegaly. Electronically Signed   By: Charlett Nose M.D.   On: 09/05/2016 11:54   US Abdomen Complete  Result Date: 09/06/2016 CLINICAL DATA:  Cirrhosis. EXAM: ABDOMEN ULTRASOUND COMPLETE COMPARISON:  Abdomen series 10 03/28/2015.  CT 09/13/2014 . FINDINGS: Gallbladder: Cholecystectomy. Common bile duct: Diameter: 4.2 mm Liver: Heterogeneously dense parenchymal pattern consistent patient's history of known cirrhosis. Liver density makes evaluation for focal lesions difficult. No focal hepatic abnormality identified. TIPS appears  patent . IVC: No abnormality visualized. Pancreas: Visualized portion unremarkable. Spleen: Size and appearance within normal limits. Right Kidney: Length: 15.2 cm. Echogenicity within normal limits. No mass or hydronephrosis visualized. Left Kidney: Length: 14.3 cm. Echogenicity within normal limits. No mass or hydronephrosis visualized. Abdominal aorta: No aneurysm visualized. Other findings: None. IMPRESSION: 1. Prior cholecystectomy.  No biliary distention. 2. Heterogeneously dense hepatic parenchymal pattern consistent patient's history of known cirrhosis. Liver density makes evaluation for focal lesions difficult. No focal hepatic abnormality identified. TIPS patent. Electronically Signed   By: Maisie Fus  Register   On: 09/06/2016 07:35   [place] Art/ven Flow Abd Pelv Doppler  Result Date: 09/06/2016 CLINICAL DATA:  Abdominal distention, cirrhosis, post TIPS 01/30/2012 EXAM: DUPLEX ULTRASOUND OF LIVER AND TIPS SHUNT TECHNIQUE: Color and duplex Doppler ultrasound was performed to evaluate the hepatic in-flow and out-flow vessels. Limited evaluation secondary to body habitus. COMPARISON:  06/12/2013 FINDINGS: TIPS Shunt Velocities (all antegrade) Proximal:  104 cm/sec Mid:  93 Distal:  56 cm/sec Portal Vein Velocities Main:  33 cm/sec Right:  Not visualized Left:  Not visualized Hepatic Vein Velocities Right:    Not visualized Middle:  Not visualized he Left:    Not visualized Hepatic Artery Velocity: 70 cm/sec Splenic Vein Velocity:  25 cm/second Varices:  None visualized Ascites: None Other findings: Superior mesenteric vein patent with antegrade flow IMPRESSION: 1. Patent TIPS.  Limited study as above. Electronically Signed   By: Corlis Leak M.D.   On: 09/06/2016 08:03  Medications: Scheduled Meds: . amoxicillin-clavulanate  1 tablet Oral Q12H  . carvedilol  3.125 mg Oral BID WC  . FLUoxetine  20 mg Oral Daily  . folic acid  1 mg Oral Daily  . insulin aspart  0-15 Units Subcutaneous TID WC  .  Magnesium Oxide  1 tablet Oral BID  . multivitamin with minerals  1 tablet Oral Daily  . pantoprazole (PROTONIX) IV  40 mg Intravenous Q12H  . thiamine  100 mg Oral Daily   Or  . thiamine  100 mg Intravenous Daily   Continuous Infusions: PRN Meds:.acetaminophen, LORazepam **OR** LORazepam, ondansetron **OR** ondansetron (ZOFRAN) IV    Assessment/Plan: 51 y.o. male with mild alcoholic hepatitis, underlying Etoh Cirrhosis, psychiatric troubles  TIPS is patent, no further overt bleeding and Hb has remained normal; He is not have an acute GI bleed.  Will change IV PPI to oral, once daily should suffice.  I am changing diet to low salt solids.  He needs to ambulate.  OK to d/c from GI perspective when he is eating better, ambulating and psych concerns are resolved.  Most important is that he never resume drinking alcohol.  Please call or page with any further questions or concerns.   Rachael FeeJacobs, Daniel P, MD  09/06/2016, 10:10 AM [facility] Pager [phone]

## 2016-09-06 NOTE — Progress Notes (Signed)
CM received consult: Medical insurance coverage. CM made referral to Artistin[employer] Counselor. Gae GallopAngela Kenesha Moshier RN,BSN,CM

## 2016-09-06 NOTE — Progress Notes (Signed)
Subjective: Patient was lying in bed sleeping upon entering the room. The voice interpreter video assisted device was used to communicate with the patient who stated that he felt the same as the day before. He admitted to the chest pain that is worse activity however he described the pain a being lateral to the sternum, sharp and reproducible with palpation. He stated that the pain has remained stable for some time now. He again complained of a headache in the occipital region near the base of the skull. It was explained that the source of bleeding was coming from his mouth/tooth an since it had stopped he could be sent home. In addition his hemoglobin level had stabilized and the abdominal US showed a patent TIPS lending further evidence to the conclusion that he had been suffering from an oral bleed.   Objective:  Vital signs in last 24 hours: Vitals:   09/06/16 0028 09/06/16 0029 09/06/16 0601 09/06/16 0900  BP: (!) 161/86 (!) 159/79 (!) 158/76   Pulse: 75 70 68   Resp: 18  18   Temp: 99 F (37.2 C)  98.6 F (37 C)   TempSrc: Oral  Oral   SpO2: 99% 98% 98% 100%  Weight:      Height:      Review of Systems  Constitutional: Positive for fever. Negative for chills.  Respiratory: Negative for cough, hemoptysis and sputum production.   Cardiovascular: Positive for chest pain (As described in the Subjective portion of the note. Does not appear to be cardiac in origin. ). Negative for palpitations, orthopnea and leg swelling.  Gastrointestinal: Negative for abdominal pain, blood in stool, constipation and nausea.  Neurological: Positive for headaches. Negative for dizziness and weakness.  Psychiatric/Behavioral: Positive for depression. Negative for suicidal ideas (Patient denied suicidal or homicidal thoughts to the team today. ). The patient is nervous/anxious.    Physical Exam  Constitutional: He is oriented to person, place, and time. He appears well-developed.  HENT:  Head:  Normocephalic.  Oral bleeding appears to have remitted at this time  Eyes: Pupils are equal, round, and reactive to light.  Cardiovascular: Normal rate and regular rhythm.  Exam reveals no friction rub.   No murmur heard. Pulmonary/Chest: Effort normal and breath sounds normal. No respiratory distress. He has no wheezes.  Abdominal: Soft. He exhibits no distension. There is no tenderness. There is no guarding.  Neurological: He is alert and oriented to person, place, and time.  Skin: Skin is warm and dry.    Assessment/Plan:  Active Problems:   Esophageal varices in alcoholic cirrhosis (HCC)   Alcoholic cirrhosis of liver without ascites (HCC)   Alcohol intoxication (HCC)   Hematemesis  Esophageal Varices in alcoholic cirrhosis  -US showed a patent TIPS -Patient refused EGD -GI also stated they believe the bleed to be of origin in the oral cavity.   Alcoholic cirrhosis of liver without ascites -Continue folic acid 1mg , magnesium, thiamine, and Lactulose on discharge only.   Alcoholic intoxication: -CIWA protocol continued-routine as needed  Hematemesis: -Not believed to be hematemesis at this time-it appears as if the bleeding has resolved and was oral in nature. -Repeat hemoglobin stabilized on repeat CBC  Oral Bleed: -Significant decay of multiple teeth. A molar in the lower jaw, left side appeared to have been actively bleeding with minor clotting in the area.  -The bleed has now resolved with pressure overnight.    Dispo: Anticipated discharge in approximately today.   Lanelle BalLawrence Harbrecht, MD Internal  Medicine, PGY-1 Pager # [phone]

## 2016-09-06 NOTE — Progress Notes (Signed)
.  [name] [name] to be D/C'd Home per MD order.  Discussed with the patient and all questions fully answered.  VSS, Skin clean, dry and intact without evidence of skin break down, no evidence of skin tears noted. IV catheter discontinued intact. Site without signs and symptoms of complications. Dressing and pressure applied.  An After Visit Summary was printed and given to the patient. Patient received prescription.  D/c education completed with patient/family including follow up instructions, medication list, d/c activities limitations if indicated, with other d/c instructions as indicated by MD - patient able to verbalize understanding, all questions fully answered.   Patient instructed to return to ED, call 911, or call MD for any changes in condition.   Patient escorted via WC, and D/C home via private auto. Allergies as of 09/06/2016   No Known Allergies     Medication List    TAKE these medications   amoxicillin-clavulanate 875-125 MG tablet Commonly known as:  AUGMENTIN Take 1 tablet by mouth every 12 (twelve) hours.   carvedilol 3.125 MG tablet Commonly known as:  COREG Take 1 tablet (3.125 mg total) by mouth 2 (two) times daily with a meal.   FLUoxetine 20 MG capsule Commonly known as:  PROZAC Take 1 capsule (20 mg total) by mouth daily. Start taking on:  09/07/2016   folic acid 1 MG tablet Commonly known as:  FOLVITE Take 1 tablet (1 mg total) by mouth daily.   lactulose 10 GM/15ML solution Commonly known as:  CHRONULAC Take 45 mLs (30 g total) by mouth 3 (three) times daily.   Magnesium Oxide 400 (240 Mg) MG Tabs Take 1 tablet by mouth 2 (two) times daily.   metFORMIN 1000 MG tablet Commonly known as:  GLUCOPHAGE Take 1 tablet (1,000 mg total) by mouth 2 (two) times daily.   multivitamin with minerals Tabs tablet Take 1 tablet by mouth daily. Start taking on:  09/07/2016   pantoprazole 40 MG tablet Commonly known as:  PROTONIX Take 1 tablet (40 mg total) by  mouth daily.   thiamine 100 MG tablet Take 1 tablet (100 mg total) by mouth daily. Start taking on:  09/07/2016     all education provided via interpreter to patient and niece   [name] [name] 09/06/2016 2:50 PM

## 2016-09-07 DIAGNOSIS — J189 Pneumonia, unspecified organism: Secondary | ICD-10-CM

## 2016-09-13 ENCOUNTER — Inpatient Hospital Stay: Payer: Self-pay

## 2017-03-08 ENCOUNTER — Emergency Department (HOSPITAL_COMMUNITY): Payer: Medicaid Other

## 2017-03-08 ENCOUNTER — Other Ambulatory Visit: Payer: Self-pay

## 2017-03-08 ENCOUNTER — Emergency Department (HOSPITAL_COMMUNITY)
Admission: EM | Admit: 2017-03-08 | Discharge: 2017-03-08 | Disposition: A | Discharge status: Home / Self Care | Payer: Medicaid Other | Attending: Emergency Medicine | Admitting: Emergency Medicine

## 2017-03-08 ENCOUNTER — Encounter (HOSPITAL_COMMUNITY): Payer: Self-pay | Admitting: Emergency Medicine

## 2017-03-08 DIAGNOSIS — R072 Precordial pain: Secondary | ICD-10-CM

## 2017-03-08 DIAGNOSIS — I110 Hypertensive heart disease with heart failure: Secondary | ICD-10-CM | POA: Diagnosis not present

## 2017-03-08 DIAGNOSIS — Z87891 Personal history of nicotine dependence: Secondary | ICD-10-CM | POA: Insufficient documentation

## 2017-03-08 DIAGNOSIS — K7460 Unspecified cirrhosis of liver: Secondary | ICD-10-CM | POA: Insufficient documentation

## 2017-03-08 DIAGNOSIS — E119 Type 2 diabetes mellitus without complications: Secondary | ICD-10-CM | POA: Insufficient documentation

## 2017-03-08 DIAGNOSIS — Z79899 Other long term (current) drug therapy: Secondary | ICD-10-CM | POA: Diagnosis not present

## 2017-03-08 DIAGNOSIS — Z7984 Long term (current) use of oral hypoglycemic drugs: Secondary | ICD-10-CM | POA: Insufficient documentation

## 2017-03-08 DIAGNOSIS — F10929 Alcohol use, unspecified with intoxication, unspecified: Secondary | ICD-10-CM | POA: Diagnosis not present

## 2017-03-08 DIAGNOSIS — I5032 Chronic diastolic (congestive) heart failure: Secondary | ICD-10-CM | POA: Insufficient documentation

## 2017-03-08 LAB — CBC
HEMATOCRIT: 46.4 % (ref 39.0–52.0)
HEMOGLOBIN: 16.1 g/dL (ref 13.0–17.0)
MCH: 34.5 pg — ABNORMAL HIGH (ref 26.0–34.0)
MCHC: 34.7 g/dL (ref 30.0–36.0)
MCV: 99.6 fL (ref 78.0–100.0)
Platelets: 73 10*3/uL — ABNORMAL LOW (ref 150–400)
RBC: 4.66 MIL/uL (ref 4.22–5.81)
RDW: 12.9 % (ref 11.5–15.5)
WBC: 4 10*3/uL (ref 4.0–10.5)

## 2017-03-08 LAB — BASIC METABOLIC PANEL
ANION GAP: 9 (ref 5–15)
CHLORIDE: 107 mmol/L (ref 101–111)
CO2: 27 mmol/L (ref 22–32)
Calcium: 8.1 mg/dL — ABNORMAL LOW (ref 8.9–10.3)
Creatinine, Ser: 0.49 mg/dL — ABNORMAL LOW (ref 0.61–1.24)
Glucose, Bld: 204 mg/dL — ABNORMAL HIGH (ref 65–99)
POTASSIUM: 3.5 mmol/L (ref 3.5–5.1)
Sodium: 143 mmol/L (ref 135–145)

## 2017-03-08 LAB — ETHANOL: ALCOHOL ETHYL (B): 439 mg/dL — AB (ref <10)

## 2017-03-08 LAB — LIPASE, BLOOD: LIPASE: 46 U/L (ref 11–51)

## 2017-03-08 LAB — I-STAT TROPONIN, ED
TROPONIN I, POC: 0.01 ng/mL (ref 0.00–0.08)
Troponin i, poc: 0.01 ng/mL (ref 0.00–0.08)

## 2017-03-08 MED ORDER — KETOROLAC TROMETHAMINE 30 MG/ML IJ SOLN: 15.0000 mg | Freq: Once | INTRAMUSCULAR | Status: DC

## 2017-03-08 MED ORDER — SODIUM CHLORIDE 0.9 % IV BOLUS (SEPSIS)
1000.0000 mL | Freq: Once | INTRAVENOUS | Status: AC
  Administered 2017-03-08: 1000 mL via INTRAVENOUS

## 2017-03-08 MED ORDER — THIAMINE HCL 100 MG/ML IJ SOLN
100.0000 mg | Freq: Once | INTRAMUSCULAR | Status: AC
  Administered 2017-03-08: 100 mg via INTRAVENOUS
  Filled 2017-03-08: qty 2

## 2017-03-08 MED ORDER — PANTOPRAZOLE SODIUM 40 MG IV SOLR
40.0000 mg | Freq: Once | INTRAVENOUS | Status: AC
  Administered 2017-03-08: 40 mg via INTRAVENOUS
  Filled 2017-03-08: qty 40

## 2017-03-08 NOTE — Discharge Instructions (Signed)
It was my pleasure taking care of you today!   Decrease alcohol consumption. Stay hydrated.   Follow up with your primary care doctor.   Return to ER for chest pain worse with exertion, trouble breathing, new or worsening symptoms, any additional concerns.

## 2017-03-08 NOTE — ED Notes (Signed)
Pt ambulatory to restroom with steady gait.

## 2017-03-08 NOTE — ED Triage Notes (Signed)
Patient arrived with EMS from street reports central chest pain this evening with mild SOB , no nausea or diaphoresis , intoxicated with ETOH , no emesis or diaphoresis .

## 2017-03-08 NOTE — ED Notes (Signed)
Pt ambulates unassisted back from bathroom. Steady gait.

## 2017-03-08 NOTE — ED Notes (Signed)
Pt sitting up in bed. Given Mal[place] sandwhich and gigngerale. C/o headache. Will inform provider.

## 2017-03-08 NOTE — ED Notes (Addendum)
Pt rouses to voice and touch. Repositioned. MAEW resp even and non labored.

## 2017-03-08 NOTE — ED Provider Notes (Signed)
[facility] EMERGENCY DEPARTMENT Provider Note   CSN: [id number] Arrival date & time: 03/08/17  0043     History   Chief Complaint Chief Complaint  Patient presents with  . Chest Pain  . Alcohol Intoxication    HPI Tyus Kallam is a 52 y.o. male.  The history is provided by the patient and medical records. No language interpreter was used.  Chest Pain   Pertinent negatives include no palpitations.  Alcohol Intoxication  Associated symptoms include chest pain.   Travious Vanover is a 52 y.o. male  with a PMH of DM, GERD, ETOH abuse, cirrhosis who presents to the Emergency Department complaining of chest pain x 3 days which got worse this evening after drinking. No radiation of pain. No shortness of breath, nausea, vomiting, back pain, fever, chills, diaphoresis.  No medications taken prior to arrival for his symptoms.  Worse with drinking or eating.  Not worse with any type of exertion.  Denies alleviating factors.  Past Medical History:  Diagnosis Date  . Alcohol abuse   . Cirrhosis (HCC)   . Diabetes mellitus without complication (HCC)   . GERD (gastroesophageal reflux disease)   . H/O hiatal hernia   . Headache(784.0)   . Seizures (HCC)   . Shortness of breath   . Sleep apnea     Patient Active Problem List   Diagnosis Date Noted  . Pneumonitis 09/07/2016  . Oral bleeding   . Pneumonia of right lower lobe due to infectious organism (HCC)   . GERD without esophagitis 04/21/2016  . Blurry vision, bilateral 04/21/2016  . Encephalopathy, hepatic (HCC) 12/29/2015  . Alcoholic hepatitis without ascites   . Hematemesis 12/27/2015  . Fall   . Chest pain   . Shortness of breath   . Hypoxemia 09/30/2015  . Alcohol intoxication (HCC) 09/30/2015  . Hypoxia 09/30/2015  . Acute diastolic CHF (congestive heart failure) (HCC) 11/06/2014  . History of cirrhosis of liver   . Pulmonary vascular congestion 11/05/2014  . Volume overload 11/05/2014  .  Diastolic dysfunction with acute on chronic heart failure (HCC) 11/05/2014  . Abdominal pain, chronic, epigastric 11/05/2014  . Diabetes (HCC) 07/24/2013  . HTN (hypertension) 07/24/2013  . Left arm weakness 06/12/2013  . S/P cholecystectomy 06/12/2013  . Splenomegaly 06/12/2013  . Encephalopathy 06/11/2013  . Alcoholic cirrhosis of liver without ascites (HCC) 11/02/2012  . DM (diabetes mellitus) type 2, uncontrolled, with ketoacidosis (HCC) 11/02/2012  . Pain in joint, lower leg 11/02/2012  . Syncopal episodes 10/21/2012  . Abdominal pain 10/21/2012  . Alcohol abuse 01/29/2012  . Alcoholic cirrhosis (HCC) 01/29/2012  . Thrombocytopenia, secondary 01/29/2012  . DM type 2 (diabetes mellitus, type 2) (HCC) 01/29/2012  . Esophageal varices in alcoholic cirrhosis (HCC) 01/29/2012  . History of GI bleed 01/29/2012    Past Surgical History:  Procedure Laterality Date  . APPENDECTOMY    . ESOPHAGOGASTRODUODENOSCOPY  multiple  . RADIOLOGY WITH ANESTHESIA  01/30/2012   Procedure: RADIOLOGY WITH ANESTHESIA;  Surgeon: Casimiro Needle T. Miles Costain, MD;  Location: [facility] OR;  Service: Radiology;  Laterality: N/A;  . TIPS PROCEDURE         Home Medications    Prior to Admission medications   Medication Sig Start Date End Date Taking? Authorizing Provider  carvedilol (COREG) 3.125 MG tablet Take 1 tablet (3.125 mg total) by mouth 2 (two) times daily with a meal. 04/21/16  Yes Jegede, Olugbemiga E, MD  FLUoxetine (PROZAC) 20 MG capsule Take 1 capsule (20  mg total) by mouth daily. 09/07/16  Yes John Giovanniathore, Vasundhra, MD  folic acid (FOLVITE) 1 MG tablet Take 1 tablet (1 mg total) by mouth daily. 04/21/16  Yes Quentin AngstJegede, Olugbemiga E, MD  lactulose (CHRONULAC) 10 GM/15ML solution Take 45 mLs (30 g total) by mouth 3 (three) times daily. 04/21/16  Yes Quentin AngstJegede, Olugbemiga E, MD  Magnesium Oxide 400 (240 Mg) MG TABS Take 1 tablet by mouth 2 (two) times daily. 12/31/15  Yes Richarda OverlieAbrol, Nayana, MD  metFORMIN (GLUCOPHAGE) 1000 MG  tablet Take 1 tablet (1,000 mg total) by mouth 2 (two) times daily. 04/21/16  Yes Quentin AngstJegede, Olugbemiga E, MD  metoprolol tartrate (LOPRESSOR) 25 MG tablet Take 12.5 tablets by mouth 2 (two) times daily. 02/16/17 03/18/17 Yes [provider]  Multiple Vitamin (MULTIVITAMIN WITH MINERALS) TABS tablet Take 1 tablet by mouth daily. 09/07/16  Yes John Giovanniathore, Vasundhra, MD  pantoprazole (PROTONIX) 40 MG tablet Take 1 tablet (40 mg total) by mouth daily. 04/21/16  Yes Quentin AngstJegede, Olugbemiga E, MD  thiamine 100 MG tablet Take 1 tablet (100 mg total) by mouth daily. 09/07/16  Yes John Giovanniathore, Vasundhra, MD    Family History Family History  Problem Relation Age of Onset  . Hypertension Mother   . Syncope episode Sister     Social History Social History   Tobacco Use  . Smoking status: Former Smoker    Packs/day: 0.25    Years: 10.00    Pack years: 2.50    Types: Cigarettes    Last attempt to quit: 09/30/2007    Years since quitting: 9.4  . Smokeless tobacco: Never Used  Substance Use Topics  . Alcohol use: Yes    Comment: 10/01/2014  "  i USUALLY DRINK A 12 PK ON THE WEEKENDS "  . Drug use: No     Allergies   Patient has no known allergies.   Review of Systems Review of Systems  Cardiovascular: Positive for chest pain. Negative for palpitations and leg swelling.  All other systems reviewed and are negative.    Physical Exam Updated Vital Signs BP 137/88 (BP Location: Right Arm)   Pulse (!) 101   Temp 97.9 F (36.6 C) (Oral)   Resp 17   SpO2 96%   Physical Exam  Constitutional: He is oriented to person, place, and time. He appears well-developed and well-nourished. No distress.  HENT:  Head: Normocephalic and atraumatic.  Neck: Neck supple. No JVD present.  Cardiovascular: Normal rate, regular rhythm and normal heart sounds.  No murmur heard. Pulmonary/Chest: Effort normal and breath sounds normal. No respiratory distress. He has no wheezes. He has no rales.  Tenderness along  central and left-sided chest wall.  Abdominal: Soft. Bowel sounds are normal. He exhibits no distension.  Epigastric tenderness without rebound or guarding.  Musculoskeletal: He exhibits no edema.  Neurological: He is alert and oriented to person, place, and time.  Skin: Skin is warm and dry.  Nursing note and vitals reviewed.    ED Treatments / Results  Labs (all labs ordered are listed, but only abnormal results are displayed) Labs Reviewed  BASIC METABOLIC PANEL - Abnormal; Notable for the following components:      Result Value   Glucose, Bld 204 (*)    BUN <5 (*)    Creatinine, Ser 0.49 (*)    Calcium 8.1 (*)    All other components within normal limits  CBC - Abnormal; Notable for the following components:   MCH 34.5 (*)    Platelets 73 (*)  All other components within normal limits  ETHANOL - Abnormal; Notable for the following components:   Alcohol, Ethyl (B) 439 (*)    All other components within normal limits  LIPASE, BLOOD  I-STAT TROPONIN, ED  I-STAT TROPONIN, ED    EKG  EKG Interpretation  Date/Time:  Tuesday March 08 2017 00:42:26 EST Ventricular Rate:  78 PR Interval:  196 QRS Duration: 108 QT Interval:  422 QTC Calculation: 481 R Axis:   20 Text Interpretation:  Normal sinus rhythm Nonspecific ST abnormality Prolonged QT Abnormal ECG No significant change since last tracing Confirmed by Frederick Peers [phone]) on 03/08/2017 10:30:06 AM       Radiology Dg Chest 2 View  Result Date: 03/08/2017 CLINICAL DATA:  52 y/o M; central chest pain mild shortness of breath. EXAM: CHEST  2 VIEW COMPARISON:  02/15/2017 chest radiograph. FINDINGS: Stable normal cardiac silhouette given projection and technique. Clear lungs. No acute osseous abnormality is evident. No pleural effusion or pneumothorax. IMPRESSION: No acute pulmonary process identified. Electronically Signed   By: Mitzi Hansen M.D.   On: 03/08/2017 01:27    Procedures Procedures  (including critical care time)  Medications Ordered in ED Medications  sodium chloride 0.9 % bolus 1,000 mL (0 mLs Intravenous Stopped 03/08/17 1040)  pantoprazole (PROTONIX) injection 40 mg (40 mg Intravenous Given 03/08/17 0844)  thiamine (B-1) injection 100 mg (100 mg Intravenous Given 03/08/17 0844)  sodium chloride 0.9 % bolus 1,000 mL (1,000 mLs Intravenous New Bag/Given 03/08/17 1152)     Initial Impression / Assessment and Plan / ED Course  I have reviewed the triage vital signs and the nursing notes.  Pertinent labs & imaging results that were available during my care of the patient were reviewed by me and considered in my medical decision making (see chart for details).    Joshiah Traynham is a 52 y.o. male who presents to ED for chest pain x 3 days. On exam, patient is afebrile, hemodynamically stable with tenderness to his epigastrium and chest wall which is where he endorses pain.  No lower extremity edema or JVD.  Lungs and heart sounds normal.  EKG unchanged from previous.  Troponin negative x2.  EtOH of 439 -remainder of labs reassuring.  Chest x-ray negative. Heart score of 3. Low risk Well's. Patient's symptoms unlikely to be of cardiac etiology. Improved with protonix. Fluid bolus and thiamine given as well. Patient observed in ED for several hours and now clinically sober. He has eaten lunch, tolerating PO, ambulatory in ED with steady gait without assistance. He states his symptoms have resolved. Patient has been advised to return to the ED if development of any exertional chest pain, trouble breathing, new/worsening symptoms or for any additional concerns. Evaluation does not show pathology that would require ongoing emergent intervention or inpatient treatment. Encouraged to follow up with cardiology. Patient understands return precautions and follow up plan. All questions answered.   Final Clinical Impressions(s) / ED Diagnoses   Final diagnoses:  Alcoholic intoxication with  complication [facility])  Precordial chest pain    ED Discharge Orders    None       Antoine Fiallos, Chase Picket, PA-C 03/08/17 1429    Little, Ambrose Finland, MD 03/09/17 [phone]

## 2017-11-23 ENCOUNTER — Emergency Department (HOSPITAL_BASED_OUTPATIENT_CLINIC_OR_DEPARTMENT_OTHER)
Admission: EM | Admit: 2017-11-23 | Discharge: 2017-11-24 | Disposition: A | Discharge status: Home / Self Care | Payer: Medicaid Other | Attending: Emergency Medicine | Admitting: Emergency Medicine

## 2017-11-23 ENCOUNTER — Emergency Department (HOSPITAL_BASED_OUTPATIENT_CLINIC_OR_DEPARTMENT_OTHER): Payer: Medicaid Other

## 2017-11-23 ENCOUNTER — Other Ambulatory Visit: Payer: Self-pay

## 2017-11-23 ENCOUNTER — Encounter (HOSPITAL_BASED_OUTPATIENT_CLINIC_OR_DEPARTMENT_OTHER): Payer: Self-pay | Admitting: Emergency Medicine

## 2017-11-23 DIAGNOSIS — Z7984 Long term (current) use of oral hypoglycemic drugs: Secondary | ICD-10-CM | POA: Insufficient documentation

## 2017-11-23 DIAGNOSIS — Z87891 Personal history of nicotine dependence: Secondary | ICD-10-CM | POA: Insufficient documentation

## 2017-11-23 DIAGNOSIS — R0789 Other chest pain: Secondary | ICD-10-CM | POA: Insufficient documentation

## 2017-11-23 DIAGNOSIS — I110 Hypertensive heart disease with heart failure: Secondary | ICD-10-CM | POA: Insufficient documentation

## 2017-11-23 DIAGNOSIS — Y908 Blood alcohol level of 240 mg/100 ml or more: Secondary | ICD-10-CM | POA: Insufficient documentation

## 2017-11-23 DIAGNOSIS — R1011 Right upper quadrant pain: Secondary | ICD-10-CM | POA: Diagnosis not present

## 2017-11-23 DIAGNOSIS — I5032 Chronic diastolic (congestive) heart failure: Secondary | ICD-10-CM | POA: Insufficient documentation

## 2017-11-23 DIAGNOSIS — F10920 Alcohol use, unspecified with intoxication, uncomplicated: Secondary | ICD-10-CM | POA: Insufficient documentation

## 2017-11-23 DIAGNOSIS — R51 Headache: Secondary | ICD-10-CM | POA: Insufficient documentation

## 2017-11-23 DIAGNOSIS — M545 Low back pain: Secondary | ICD-10-CM | POA: Insufficient documentation

## 2017-11-23 DIAGNOSIS — Z79899 Other long term (current) drug therapy: Secondary | ICD-10-CM | POA: Insufficient documentation

## 2017-11-23 DIAGNOSIS — E119 Type 2 diabetes mellitus without complications: Secondary | ICD-10-CM | POA: Diagnosis not present

## 2017-11-23 DIAGNOSIS — R4182 Altered mental status, unspecified: Secondary | ICD-10-CM | POA: Diagnosis present

## 2017-11-23 LAB — CBC WITH DIFFERENTIAL/PLATELET
BASOS ABS: 0 10*3/uL (ref 0.0–0.1)
Basophils Relative: 1 %
EOS ABS: 0.1 10*3/uL (ref 0.0–0.7)
Eosinophils Relative: 3 %
HCT: 33.5 % — ABNORMAL LOW (ref 39.0–52.0)
HEMOGLOBIN: 11.4 g/dL — AB (ref 13.0–17.0)
LYMPHS ABS: 1.6 10*3/uL (ref 0.7–4.0)
Lymphocytes Relative: 39 %
MCH: 34.9 pg — AB (ref 26.0–34.0)
MCHC: 34 g/dL (ref 30.0–36.0)
MCV: 102.4 fL — ABNORMAL HIGH (ref 78.0–100.0)
Monocytes Absolute: 0.4 10*3/uL (ref 0.1–1.0)
Monocytes Relative: 9 %
NEUTROS ABS: 2 10*3/uL (ref 1.7–7.7)
Neutrophils Relative %: 48 %
Platelets: 54 10*3/uL — ABNORMAL LOW (ref 150–400)
RBC: 3.27 MIL/uL — ABNORMAL LOW (ref 4.22–5.81)
RDW: 14.9 % (ref 11.5–15.5)
Smear Review: DECREASED
WBC: 4.1 10*3/uL (ref 4.0–10.5)

## 2017-11-23 LAB — COMPREHENSIVE METABOLIC PANEL
ALBUMIN: 2.4 g/dL — AB (ref 3.5–5.0)
ALT: 25 U/L (ref 0–44)
ANION GAP: 9 (ref 5–15)
AST: 63 U/L — ABNORMAL HIGH (ref 15–41)
Alkaline Phosphatase: 80 U/L (ref 38–126)
BILIRUBIN TOTAL: 2.5 mg/dL — AB (ref 0.3–1.2)
BUN: 5 mg/dL — ABNORMAL LOW (ref 6–20)
CO2: 26 mmol/L (ref 22–32)
Calcium: 7.7 mg/dL — ABNORMAL LOW (ref 8.9–10.3)
Chloride: 111 mmol/L (ref 98–111)
Creatinine, Ser: 0.38 mg/dL — ABNORMAL LOW (ref 0.61–1.24)
GLUCOSE: 114 mg/dL — AB (ref 70–99)
POTASSIUM: 3.6 mmol/L (ref 3.5–5.1)
Sodium: 146 mmol/L — ABNORMAL HIGH (ref 135–145)
TOTAL PROTEIN: 5.8 g/dL — AB (ref 6.5–8.1)

## 2017-11-23 LAB — TROPONIN I

## 2017-11-23 LAB — LIPASE, BLOOD: LIPASE: 46 U/L (ref 11–51)

## 2017-11-23 LAB — ETHANOL: ALCOHOL ETHYL (B): 308 mg/dL — AB (ref <10)

## 2017-11-23 MED ORDER — SODIUM CHLORIDE 0.9 % IV BOLUS
1000.0000 mL | Freq: Once | INTRAVENOUS | Status: AC
  Administered 2017-11-23: 1000 mL via INTRAVENOUS

## 2017-11-23 NOTE — ED Provider Notes (Signed)
[facility] EMERGENCY DEPARTMENT Provider Note   CSN: [id number] Arrival date & time: 11/23/17  2130  LEVEL 5 CAVEAT - POOR HISTORIAN/ETOH   History   Chief Complaint Chief Complaint  Patient presents with  . Chest Pain    HPI [name] [name] is a 52 y.o. male.  HPI  52 year old male with a history of chronic alcohol abuse, diabetes presents with a chief complaint of falling.  The history is very limited given the patient is an overall poor historian and probably intoxicated.  History is taken to the assistance of the Spanish interpreter line as well as EMS.  EMS reports they were called after a bystander saw the patient stumbling and falling.  The patient tells me that he was walking to his friends taco stand but was having trouble and was falling.  He describes some sort of abnormality to his feet, unclear if he is describing weakness or pain.  He states he is been having back pain starting tonight.  He denies any alcohol use today and states he last drank 2 drinks yesterday.  Also denies being in the hospital today.  The patient is complaining of a headache although he cannot tell me exactly how long this is been going on.  He also describes left-sided chest pain that he states is an acute on chronic issue.  Upon examination of his abdomen he states that his abdomen also chronically hurts.  Past Medical History:  Diagnosis Date  . Alcohol abuse   . Cirrhosis (HCC)   . Diabetes mellitus without complication (HCC)   . GERD (gastroesophageal reflux disease)   . H/O hiatal hernia   . Headache(784.0)   . Seizures (HCC)   . Shortness of breath   . Sleep apnea     Patient Active Problem List   Diagnosis Date Noted  . Pneumonitis 09/07/2016  . Oral bleeding   . Pneumonia of right lower lobe due to infectious organism (HCC)   . GERD without esophagitis 04/21/2016  . Blurry vision, bilateral 04/21/2016  . Encephalopathy, hepatic (HCC) 12/29/2015  . Alcoholic hepatitis  without ascites   . Hematemesis 12/27/2015  . Fall   . Chest pain   . Shortness of breath   . Hypoxemia 09/30/2015  . Alcohol intoxication (HCC) 09/30/2015  . Hypoxia 09/30/2015  . Acute diastolic CHF (congestive heart failure) (HCC) 11/06/2014  . History of cirrhosis of liver   . Pulmonary vascular congestion 11/05/2014  . Volume overload 11/05/2014  . Diastolic dysfunction with acute on chronic heart failure (HCC) 11/05/2014  . Abdominal pain, chronic, epigastric 11/05/2014  . Diabetes (HCC) 07/24/2013  . HTN (hypertension) 07/24/2013  . Left arm weakness 06/12/2013  . S/P cholecystectomy 06/12/2013  . Splenomegaly 06/12/2013  . Encephalopathy 06/11/2013  . Alcoholic cirrhosis of liver without ascites (HCC) 11/02/2012  . DM (diabetes mellitus) type 2, uncontrolled, with ketoacidosis (HCC) 11/02/2012  . Pain in joint, lower leg 11/02/2012  . Syncopal episodes 10/21/2012  . Abdominal pain 10/21/2012  . Alcohol abuse 01/29/2012  . Alcoholic cirrhosis (HCC) 01/29/2012  . Thrombocytopenia, secondary 01/29/2012  . DM type 2 (diabetes mellitus, type 2) (HCC) 01/29/2012  . Esophageal varices in alcoholic cirrhosis (HCC) 01/29/2012  . History of GI bleed 01/29/2012    Past Surgical History:  Procedure Laterality Date  . APPENDECTOMY    . ESOPHAGOGASTRODUODENOSCOPY  multiple  . RADIOLOGY WITH ANESTHESIA  01/30/2012   Procedure: RADIOLOGY WITH ANESTHESIA;  Surgeon: Casimiro Needle T. Shick, MD;  Location: [facility] OR;  Service: Radiology;  Laterality: N/A;  . TIPS PROCEDURE          Home Medications    Prior to Admission medications   Medication Sig Start Date End Date Taking? Authorizing Provider  carvedilol (COREG) 3.125 MG tablet Take 1 tablet (3.125 mg total) by mouth 2 (two) times daily with a meal. 04/21/16   Jegede, Olugbemiga E, MD  FLUoxetine (PROZAC) 20 MG capsule Take 1 capsule (20 mg total) by mouth daily. 09/07/16   John Giovanniathore, Vasundhra, MD  folic acid (FOLVITE) 1 MG tablet Take 1  tablet (1 mg total) by mouth daily. 04/21/16   Quentin AngstJegede, Olugbemiga E, MD  lactulose (CHRONULAC) 10 GM/15ML solution Take 45 mLs (30 g total) by mouth 3 (three) times daily. 04/21/16   Quentin AngstJegede, Olugbemiga E, MD  Magnesium Oxide 400 (240 Mg) MG TABS Take 1 tablet by mouth 2 (two) times daily. 12/31/15   Richarda OverlieAbrol, Nayana, MD  metFORMIN (GLUCOPHAGE) 1000 MG tablet Take 1 tablet (1,000 mg total) by mouth 2 (two) times daily. 04/21/16   Quentin AngstJegede, Olugbemiga E, MD  metoprolol tartrate (LOPRESSOR) 25 MG tablet Take 12.5 tablets by mouth 2 (two) times daily. 02/16/17 03/18/17  [provider]  Multiple Vitamin (MULTIVITAMIN WITH MINERALS) TABS tablet Take 1 tablet by mouth daily. 09/07/16   John Giovanniathore, Vasundhra, MD  pantoprazole (PROTONIX) 40 MG tablet Take 1 tablet (40 mg total) by mouth daily. 04/21/16   Quentin AngstJegede, Olugbemiga E, MD  thiamine 100 MG tablet Take 1 tablet (100 mg total) by mouth daily. 09/07/16   John Giovanniathore, Vasundhra, MD    Family History Family History  Problem Relation Age of Onset  . Hypertension Mother   . Syncope episode Sister     Social History Social History   Tobacco Use  . Smoking status: Former Smoker    Packs/day: 0.25    Years: 10.00    Pack years: 2.50    Types: Cigarettes    Last attempt to quit: 09/30/2007    Years since quitting: 10.1  . Smokeless tobacco: Never Used  Substance Use Topics  . Alcohol use: Yes    Comment: 10/01/2014  "  i USUALLY DRINK A 12 PK ON THE WEEKENDS "  . Drug use: No     Allergies   Patient has no known allergies.   Review of Systems Review of Systems  Unable to perform ROS: Other     Physical Exam Updated Vital Signs BP (!) 150/62 (BP Location: Right Arm)   Pulse 94   Temp 97.7 F (36.5 C) (Oral)   Resp 18   Ht 5\' 11"  (1.803 m)   Wt 101.2 kg   SpO2 96%   BMI 31.12 kg/m   Physical Exam  Constitutional: He appears well-developed and well-nourished.  HENT:  Head: Normocephalic and atraumatic.  Right Ear: External ear  normal.  Left Ear: External ear normal.  Nose: Nose normal.  Eyes: Pupils are equal, round, and reactive to light. EOM are normal. Right eye exhibits no discharge. Left eye exhibits no discharge.  Neck: Neck supple.  Cardiovascular: Normal rate, regular rhythm and normal heart sounds.  Pulses:      Dorsalis pedis pulses are 2+ on the right side, and 2+ on the left side.  Pulmonary/Chest: Effort normal and breath sounds normal. He exhibits tenderness.    Abdominal: Soft. There is tenderness in the right upper quadrant, epigastric area and left upper quadrant.  Musculoskeletal: He exhibits no edema.       Cervical back:  He exhibits no tenderness.       Thoracic back: He exhibits no tenderness.       Lumbar back: He exhibits tenderness.       Right lower leg: He exhibits no edema.       Left lower leg: He exhibits no edema.  Neurological: He is alert.  CN 3-12 grossly intact. 5/5 strength in all 4 extremities. Grossly normal sensation. Normal finger to nose.  Lifting legs off stretcher causes pain in back but he has full strength  Skin: Skin is warm and dry.  Nursing note and vitals reviewed.    ED Treatments / Results  Labs (all labs ordered are listed, but only abnormal results are displayed) Labs Reviewed  COMPREHENSIVE METABOLIC PANEL - Abnormal; Notable for the following components:      Result Value   Sodium 146 (*)    Glucose, Bld 114 (*)    BUN 5 (*)    Creatinine, Ser 0.38 (*)    Calcium 7.7 (*)    Total Protein 5.8 (*)    Albumin 2.4 (*)    AST 63 (*)    Total Bilirubin 2.5 (*)    All other components within normal limits  ETHANOL - Abnormal; Notable for the following components:   Alcohol, Ethyl (B) 308 (*)    All other components within normal limits  CBC WITH DIFFERENTIAL/PLATELET - Abnormal; Notable for the following components:   RBC 3.27 (*)    Hemoglobin 11.4 (*)    HCT 33.5 (*)    MCV 102.4 (*)    MCH 34.9 (*)    Platelets 54 (*)    All other  components within normal limits  LIPASE, BLOOD  TROPONIN I  RAPID URINE DRUG SCREEN, HOSP PERFORMED  CBG MONITORING, ED    EKG EKG Interpretation  Date/Time:  Wednesday November 23 2017 21:52:04 EDT Ventricular Rate:  88 PR Interval:    QRS Duration: 118 QT Interval:  402 QTC Calculation: 487 R Axis:   -19 Text Interpretation:  Sinus rhythm Nonspecific intraventricular conduction delay no acute ST/T changes Confirmed by Pricilla Loveless [phone]) on 11/23/2017 10:10:20 PM   Radiology Dg Chest 2 View  Result Date: 11/23/2017 CLINICAL DATA:  Chest pain for multiple months EXAM: CHEST - 2 VIEW COMPARISON:  10/02/2017, 09/21/2017, CT 09/03/2017 FINDINGS: Hyperinflation. Streaky atelectasis at the left base. No focal consolidation or effusion. Normal cardiomediastinal silhouette. No pneumothorax. IMPRESSION: No active cardiopulmonary disease. Streaky atelectasis at the left lung base. Electronically Signed   By: Jasmine Pang M.D.   On: 11/23/2017 22:47   Dg Lumbar Spine Complete  Result Date: 11/23/2017 CLINICAL DATA:  Back pain EXAM: LUMBAR SPINE - COMPLETE 4+ VIEW COMPARISON:  CT 02/15/2017, radiograph 09/29/2015 FINDINGS: Partially visualized tips shunt in the right upper quadrant along with coils. 5 non rib-bearing lumbar type vertebra. The SI joints are patent. Lumbar alignment is within normal limits. The vertebral body heights are normal. Mild degenerative changes at L3-L4. IMPRESSION: Mild degenerative change.  No acute osseous abnormality. Electronically Signed   By: Jasmine Pang M.D.   On: 11/23/2017 22:53   Ct Head Wo Contrast  Result Date: 11/23/2017 CLINICAL DATA:  Altered LOC left chest pain EXAM: CT HEAD WITHOUT CONTRAST TECHNIQUE: Contiguous axial images were obtained from the base of the skull through the vertex without intravenous contrast. COMPARISON:  CT brain 10/05/2017, 10/03/2017 FINDINGS: Brain: No acute territorial infarction, hemorrhage or intracranial mass is  visualized. Mild atrophy. Stable ventricle size. Vascular:  No hyperdense vessel.  No unexpected calcification Skull: Normal. Negative for fracture or focal lesion. Sinuses/Orbits: No acute finding. Other: None IMPRESSION: No CT evidence for acute intracranial abnormality.  Mild atrophy Electronically Signed   By: Jasmine Pang M.D.   On: 11/23/2017 22:51    Procedures Procedures (including critical care time)  Medications Ordered in ED Medications  sodium chloride 0.9 % bolus 1,000 mL (1,000 mLs Intravenous New Bag/Given 11/23/17 2245)     Initial Impression / Assessment and Plan / ED Course  I have reviewed the triage vital signs and the nursing notes.  Pertinent labs & imaging results that were available during my care of the patient were reviewed by me and considered in my medical decision making (see chart for details).     Patient is currently intoxicated.  History and work-up is obviously limited.  I think the reason he was falling is due to the intoxication itself.  He does not appear to have any focal neuro deficits.  When he is more sober he will need to be ambulated but otherwise his work-up is fairly benign.  His chest pain is a long-standing issue for him.  He is anemic but compared to [place] [facility] lab work 2 days ago this is stable. Care to Dr. Read Drivers  Final Clinical Impressions(s) / ED Diagnoses   Final diagnoses:  Alcoholic intoxication without complication [facility])    ED Discharge Orders    None       Pricilla Loveless, MD 11/24/17 0004

## 2017-11-23 NOTE — ED Triage Notes (Signed)
Patient presents via ems with co left chest pain and back pain; states chest pain intermittent for multiple months. Per EMS patient was seen staggering on the sidewalk and bystanders called EMS; nad noted; patient denies ETOH use today.

## 2018-09-06 DEATH — deceased

## 2018-09-21 IMAGING — DX DG LUMBAR SPINE COMPLETE 4+V
5 series · 5 of 5 positions shown · non-contrast
Comparison: CT 02/15/2017, radiograph 09/29/2015

CLINICAL DATA: Back pain

EXAM:
LUMBAR SPINE - COMPLETE 4+ VIEW

[l-spine ap]
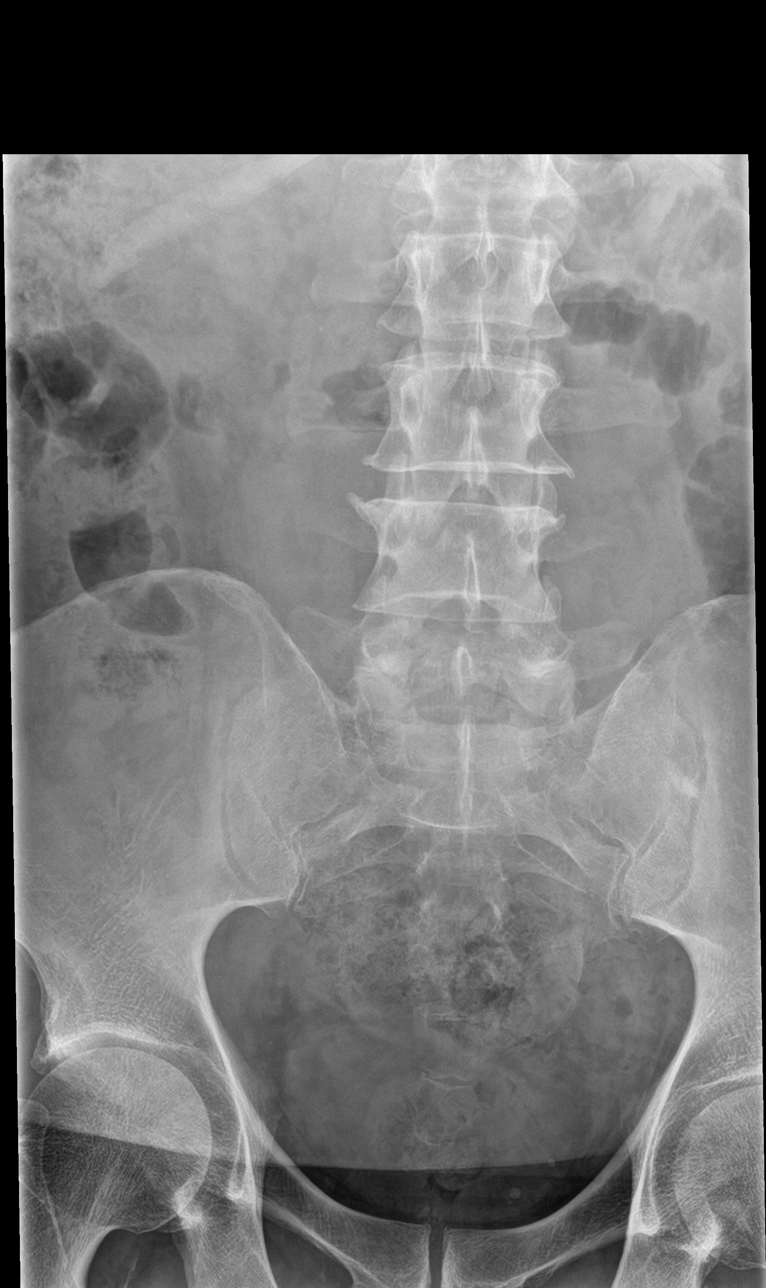

[l-spine obl (1 of 2)]
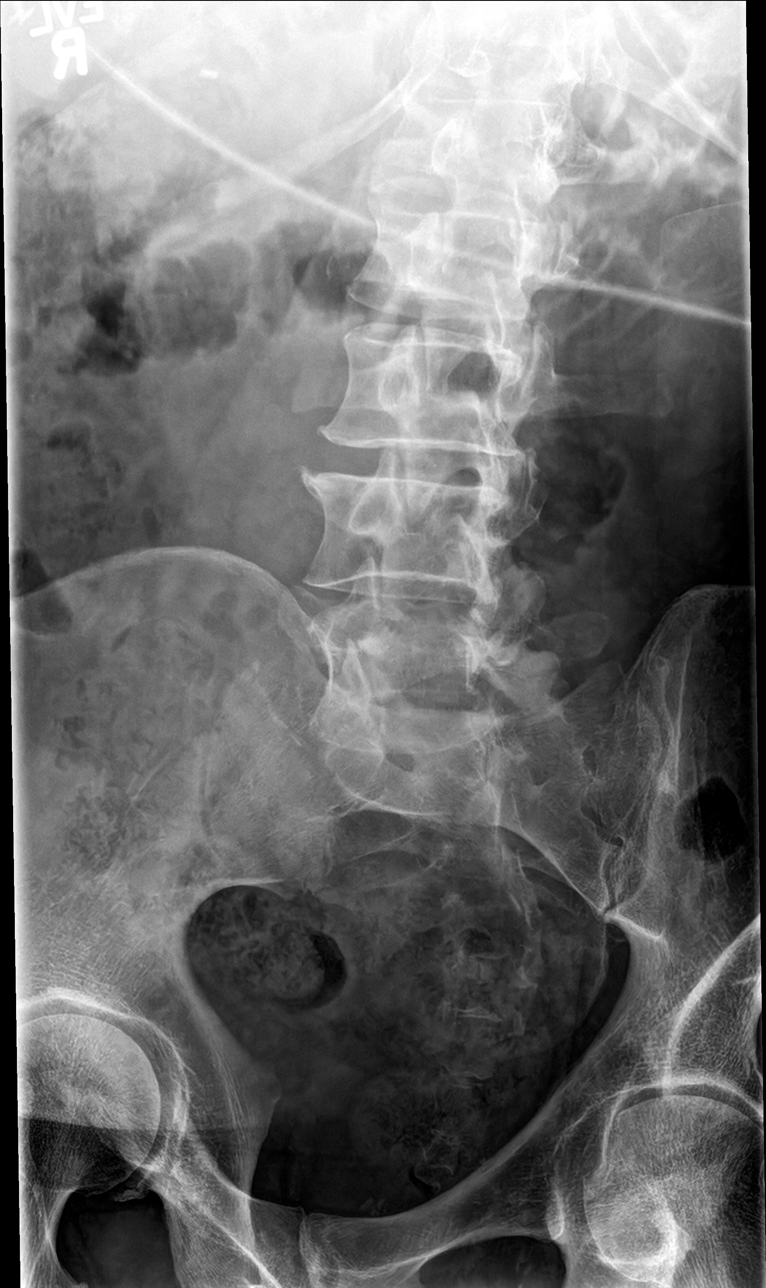

[l-spine obl (2 of 2)]
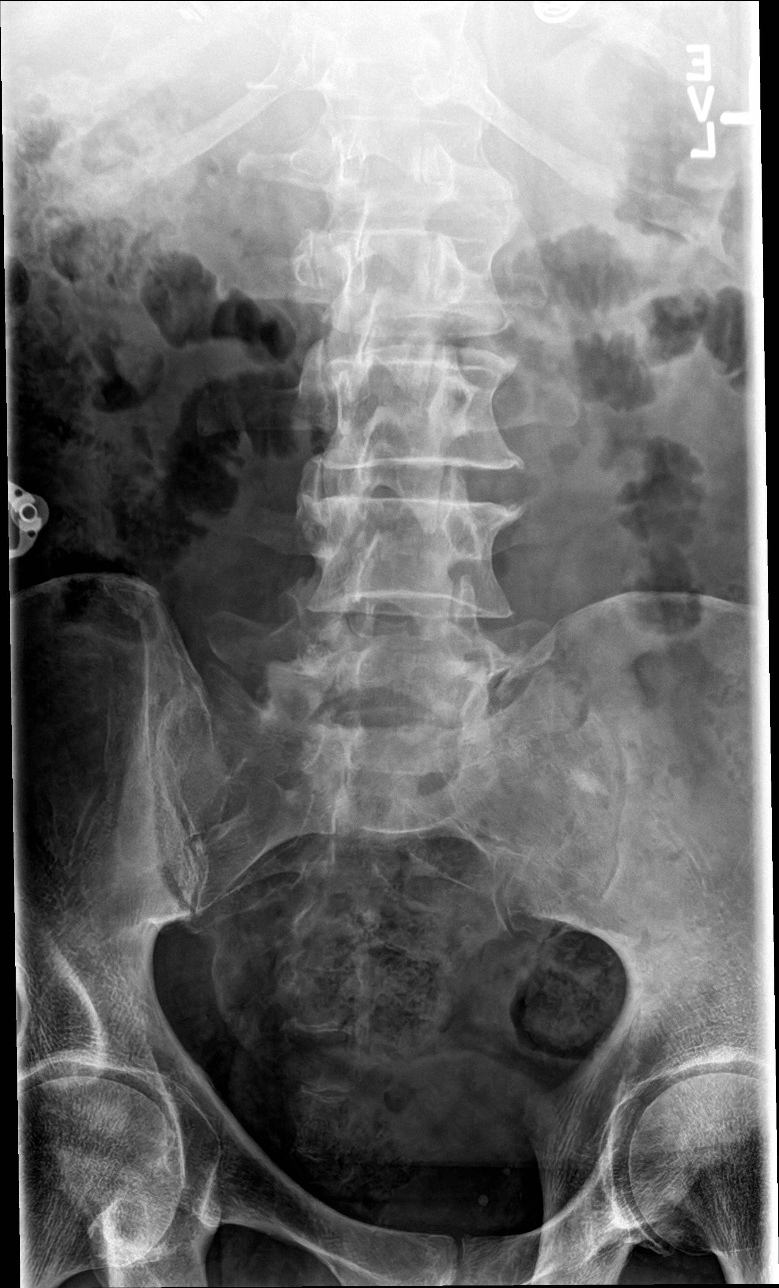

[l-spine lat]
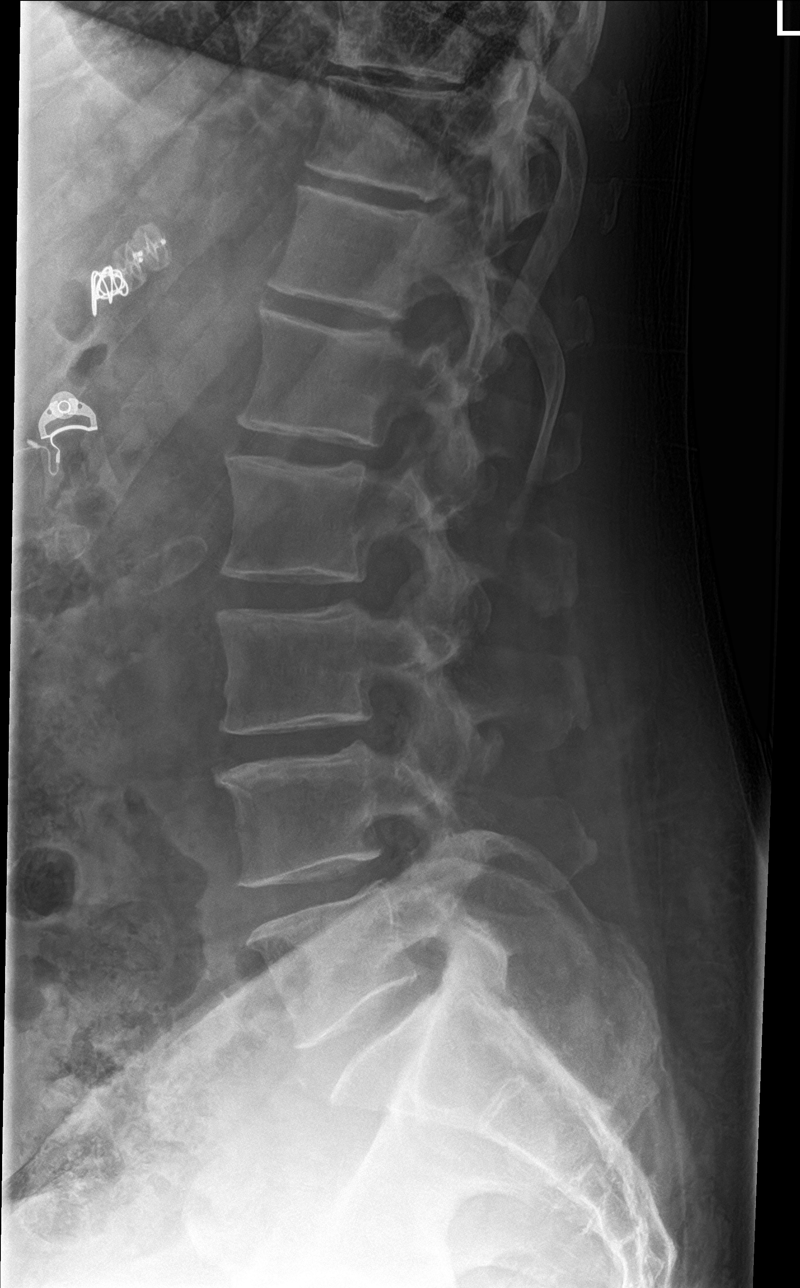

[l-spine spot]
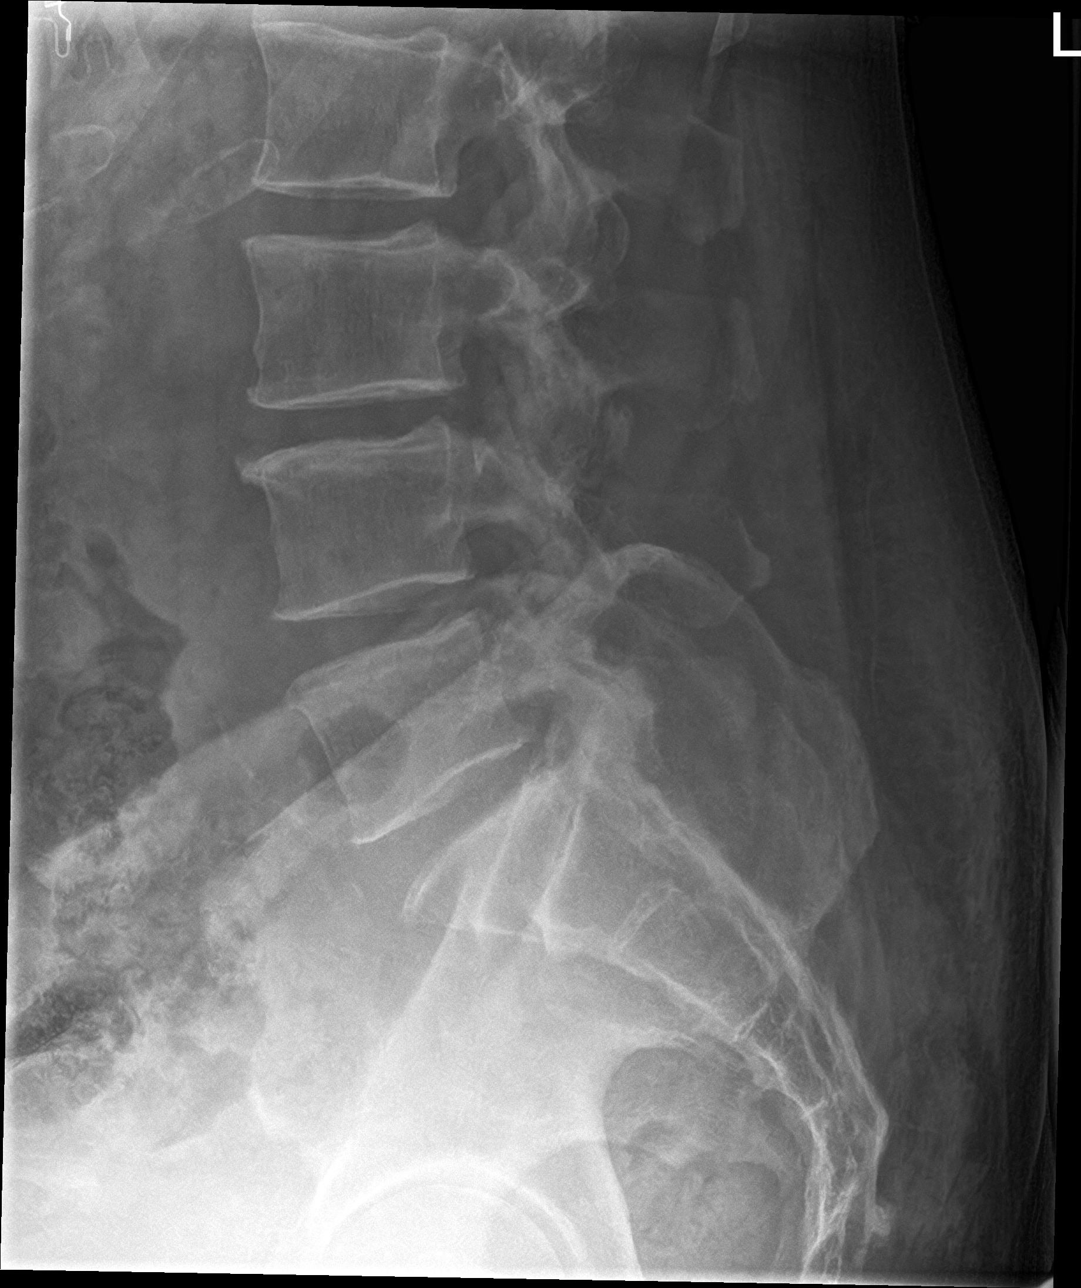

[5 of 5 positions shown; findings below may reference images not displayed]

FINDINGS: Partially visualized tips shunt in the right upper quadrant along
with coils.. 5 non rib-bearing lumbar type vertebra. The SI joints
are patent. Lumbar alignment is within normal limits. The vertebral
body heights are normal. Mild degenerative changes at L3-L4.
IMPRESSION: Mild degenerative change.  No acute osseous abnormality.
